# Patient Record
Sex: Female | Born: 1990 | Race: White | Hispanic: No | Marital: Married | Smoking: Never smoker
Health system: Southern US, Community
[De-identification: ages and names within clinical notes are randomized; demographics above are authoritative.]

## PROBLEM LIST (undated history)

## (undated) DIAGNOSIS — N390 Urinary tract infection, site not specified: Secondary | ICD-10-CM

## (undated) DIAGNOSIS — N83209 Unspecified ovarian cyst, unspecified side: Secondary | ICD-10-CM

## (undated) DIAGNOSIS — R51 Headache: Secondary | ICD-10-CM

## (undated) DIAGNOSIS — G43909 Migraine, unspecified, not intractable, without status migrainosus: Secondary | ICD-10-CM

## (undated) DIAGNOSIS — R519 Headache, unspecified: Secondary | ICD-10-CM

## (undated) DIAGNOSIS — D649 Anemia, unspecified: Secondary | ICD-10-CM

## (undated) DIAGNOSIS — B019 Varicella without complication: Secondary | ICD-10-CM

## (undated) DIAGNOSIS — N946 Dysmenorrhea, unspecified: Secondary | ICD-10-CM

## (undated) DIAGNOSIS — Z842 Family history of other diseases of the genitourinary system: Secondary | ICD-10-CM

## (undated) DIAGNOSIS — F410 Panic disorder [episodic paroxysmal anxiety] without agoraphobia: Secondary | ICD-10-CM

## (undated) DIAGNOSIS — E0500 Thyrotoxicosis with diffuse goiter without thyrotoxic crisis or storm: Secondary | ICD-10-CM

## (undated) DIAGNOSIS — T7840XA Allergy, unspecified, initial encounter: Secondary | ICD-10-CM

## (undated) DIAGNOSIS — T4145XA Adverse effect of unspecified anesthetic, initial encounter: Secondary | ICD-10-CM

## (undated) DIAGNOSIS — T8859XA Other complications of anesthesia, initial encounter: Secondary | ICD-10-CM

## (undated) HISTORY — DX: Anemia, unspecified: D64.9

## (undated) HISTORY — DX: Urinary tract infection, site not specified: N39.0

## (undated) HISTORY — DX: Dysmenorrhea, unspecified: N94.6

## (undated) HISTORY — DX: Migraine, unspecified, not intractable, without status migrainosus: G43.909

## (undated) HISTORY — DX: Allergy, unspecified, initial encounter: T78.40XA

## (undated) HISTORY — PX: WISDOM TOOTH EXTRACTION: SHX21

## (undated) HISTORY — DX: Varicella without complication: B01.9

## (undated) HISTORY — DX: Thyrotoxicosis with diffuse goiter without thyrotoxic crisis or storm: E05.00

## (undated) HISTORY — DX: Panic disorder (episodic paroxysmal anxiety): F41.0

## (undated) HISTORY — DX: Unspecified ovarian cyst, unspecified side: N83.209

## (undated) HISTORY — DX: Family history of other diseases of the genitourinary system: Z84.2

---
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
## 2012-07-18 ENCOUNTER — Ambulatory Visit: Payer: Self-pay | Admitting: General Practice

## 2013-06-04 ENCOUNTER — Ambulatory Visit: Payer: Self-pay | Admitting: General Practice

## 2014-01-08 LAB — CBC AND DIFFERENTIAL: HEMOGLOBIN: 13.3 g/dL (ref 12.0–16.0)

## 2014-01-08 LAB — BASIC METABOLIC PANEL
BUN: 8 mg/dL (ref 4–21)
Creatinine: 0.7 mg/dL (ref 0.5–1.1)
GLUCOSE: 88 mg/dL
POTASSIUM: 4.2 mmol/L (ref 3.4–5.3)
Sodium: 141 mmol/L (ref 137–147)

## 2014-01-08 LAB — HEPATIC FUNCTION PANEL
ALT: 16 U/L (ref 7–35)
AST: 22 U/L (ref 13–35)

## 2014-01-08 LAB — TSH: TSH: 2.15 u[IU]/mL (ref 0.41–5.90)

## 2014-01-08 LAB — HEMOGLOBIN A1C: Hgb A1c MFr Bld: 5.4 % (ref 4.0–6.0)

## 2014-01-21 ENCOUNTER — Ambulatory Visit: Payer: Self-pay | Admitting: Endocrinology

## 2014-01-22 ENCOUNTER — Encounter: Payer: Self-pay | Admitting: Endocrinology

## 2014-01-22 ENCOUNTER — Ambulatory Visit (INDEPENDENT_AMBULATORY_CARE_PROVIDER_SITE_OTHER): Payer: No Typology Code available for payment source | Admitting: Endocrinology

## 2014-01-22 VITALS — BP 100/62 | HR 81 | Ht 65.5 in | Wt 155.2 lb

## 2014-01-22 DIAGNOSIS — E0500 Thyrotoxicosis with diffuse goiter without thyrotoxic crisis or storm: Secondary | ICD-10-CM

## 2014-01-22 DIAGNOSIS — E559 Vitamin D deficiency, unspecified: Secondary | ICD-10-CM | POA: Insufficient documentation

## 2014-01-22 MED ORDER — ERGOCALCIFEROL 1.25 MG (50000 UT) PO CAPS: 50000.0000 [IU] | ORAL_CAPSULE | ORAL | Status: DC

## 2014-01-22 NOTE — Assessment & Plan Note (Signed)
The patient appears to be clinically euthyroid today. She does have symptoms of fatigue, tremors, and hair loss, which I do not think, are related to thyroid dysfunction. At this time as her recent TSH has been in the normal range.  Discussed with her that she has been on long-term methimazole for treatment of her Graves' disease and is taking a minimal dose of methimazole. Her TSH is in the mid normal range, and we could consider discontinuing the methimazole at this time to assess whether her Graves' disease Will go into remission. The patient is agreeable to the plan and will stop methimazole for tomorrow.  She is aware to monitor for symptoms of hyperthyroidism, and notify me what they to occur. She had questions related to sedation and dental procedures, and recent response in her heart rate seems to be unrelated to thyroid dysfunction as her labs for thyroid panel were normal recently.  [facility] will [facility] and a lab slip was given to her at this visit for thyroid profile II, free T3, and free T4 levels.  RTC 6 weeks.

## 2014-01-22 NOTE — Patient Instructions (Signed)
Stop methimazole.  Watch for symptoms of unintentional weight loss, tremors, palpitations,feeling hot all the time, diarrhea.  Report back promptly if they occur.   Repeat labs in 6 weeks to assess thyroid levels again.   Please come back for a follow-up appointment in 6 weeks

## 2014-01-22 NOTE — Progress Notes (Signed)
Pre visit review using our clinic review tool, if applicable. No additional management support is needed unless otherwise documented below in the visit note. 

## 2014-01-22 NOTE — Progress Notes (Signed)
Reason for visit: Graves' disease HPI  Ruth Tran is a 23 y.o.-year-old female, referred by [facility] for further evaluation and management of Graves' disease.  She sees Ashok Cordia at [facility]. 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  Patient reports being diagnosed with Graves' disease in 2005, in a few weeks posthospitalization for an abscess related to ruptured appendix. She was then followed by endocrinology, at [facility] and started on methimazole treatment. The patient has continued on methimazole in the past 10 years, and recalls being tried to wean off the medication over 5 years ago. She reports that her labs showed hyperthyroidism, on stopping methimazole, and she was asked to start back on it. In the past 4-5 years, the patient has continued on methimazole 2.5 mg daily and has been tolerating it well. She has missed about one to 2 weeks doses over summer, however, has been compliant with this dose daily in the last 2 months.  [facility] on 01/08/2014 show TSH at 2.15, total T4 at 6.8, free thyroxine index 1.6, T3 uptake 24, free T4-1.05.  Patient does have a strong family history of Graves' disease in her maternal grandfather and maternal great-grandmother. She denies any prior personal XRT exposure to her neck area. She is generally healthy.  Patient denies any recent hospitalization, illness or major change in his medical history. The patient denies any plans for pregnancy in the near future and is not in a relationship.  She did have a recent sedation for wisdom teeth removal, following which she reports that her HR increased from 80 to 140, and the dentist didn't go through with the procedure.  Denies any recent ingestion of seaweed/kelp or recent exposure to iv contrast dye. No recent steroid use or use of herbal supplements.    I reviewed pt's thyroid tests: Lab Results  Component Value Date   TSH  2.15 01/08/2014    Other pertinent labs are as follows: Lab Results  Component Value Date   AST 22 01/08/2014   Lab Results  Component Value Date   ALT 16 01/08/2014    Review of systems:  [ x ] complains of   [  ] denies  [ x ] Hair loss [  ] weight loss [ x ]  Tremors in the past month  [  ] palpitations  [  ] diarrhea [  ] increased anxiety [  ] muscle weakness  [ x ] heat intolerance, with night sweats [ x  ] fatigue since many years [  ] proptosis [  ] problems with eye closure or color vision. [  ] noticing any enlargement in size of thyroid [  ] lumps in neck [  ] dysphagia  [  ] change in voice [  ]  SOB when lying down   I reviewed her chart and she also has a history of Vitamin D deficiency, for which she is on high-dose vitamin D supplementation. I have reviewed the patient's past medical history, family and social history, surgical history, medications and allergies.  No past medical history on file. Past Surgical History  Procedure Laterality Date  . Appendectomy     History   Social History  . Marital Status: Single    Spouse Name: N/A    Number of Children: N/A  . Years of Education: N/A   Occupational History  . Not on file.   Social History Main Topics  .  Smoking status: Never Smoker   . Smokeless tobacco: Not on file  . Alcohol Use: Not on file  . Drug Use: Not on file  . Sexual Activity: Not on file   Other Topics Concern  . Not on file   Social History Narrative  . No narrative on file   No current outpatient prescriptions on file prior to visit.   No current facility-administered medications on file prior to visit.   Allergies  Allergen Reactions  . Latex Rash  . Tape Rash   Family History  Problem Relation Age of Onset  . Diabetes Father   . Graves' disease Maternal Grandfather   . Graves' disease Other     ROS: Review of Systems: [x]  complains of  [  ] denies General:   [  ] Recent weight change [ x ] Fatigue  [  ] Loss of  appetite Eyes: [  ]  Vision Difficulty [  ]  Eye pain ENT: [  ]  Hearing difficulty [  ]  Difficulty Swallowing CVS: [  ] Chest pain [  ]  Palpitations/Irregular Heart beat [  ]  Shortness of breath lying flat [  ] Swelling of legs Resp: [  ] Frequent Cough [  ] Shortness of Breath  [  ]  Wheezing GI: [  ] Heartburn  [  ] Nausea or Vomiting  [  ] Diarrhea [  ] Constipation  [  ] Abdominal Pain GU: [  ]  Polyuria  [  ]  nocturia Bones/joints:  [  ]  Muscle aches  [  ] Joint Pain  [  ] Bone pain Skin/Hair/Nails: [  ]  Rash  [  ] New stretch marks [  ]  Itching [  ] Hair loss [  ]  Excessive hair growth Reproduction: [  ] Low sexual desire , [  ]  Women: Menstrual cycle problems [  ]  Women: Breast Discharge [  ] Men: Difficulty with erections [  ]  Men: Enlarged Breasts CNS: [  ] Frequent Headaches [  ] Blurry vision [  ] Tremors [  ] Seizures [  ] Loss of consciousness [  ] Localized weakness Endocrine: [  ]  Excess thirst [  ]  Feeling excessively hot [  ]  Feeling excessively cold Heme: [  ]  Easy bruising [  ]  Enlarged glands or lumps in neck Allergy: [  ]  Food allergies [  ] Environmental allergies  PE: BP 100/62  Pulse 81  Ht 5' 5.5" (1.664 m)  Wt 155 lb 4 oz (70.421 kg)  BMI 25.43 kg/m2  SpO2 97% Wt Readings from Last 3 Encounters:  01/22/14 155 lb 4 oz (70.421 kg)    HEENT: [place]/AT, EOMI, no icterus, no proptosis, no chemosis, no mild lid lag, no retraction, eyes close completely Neck: thyroid gland - smooth, non-tender, no erythema, no tracheal deviation; negative Pemberton's sign; no lympahadenopathy; no bruits Lungs: good air entry, clear bilaterally Heart: S1&S2 normal, regular rate & rhythm; no murmurs, rubs or gallops Abd: soft, NT, ND, no HSM, +BS Ext: min tremor in hands bilaterally, no edema, 2+ DP/PT pulses, good muscle mass Neuro: normal gait, 2+ reflexes bilaterally, normal 5/5 strength, no proximal myopathy  Derm: no pretibial myxoedema/skin  dryness   ASSESSMENT: Problem List Items Addressed This Visit     Endocrine   Graves disease - Primary     The patient appears to be  clinically euthyroid today. She does have symptoms of fatigue, tremors, and hair loss, which I do not think, are related to thyroid dysfunction. At this time as her recent TSH has been in the normal range.  Discussed with her that she has been on long-term methimazole for treatment of her Graves' disease and is taking a minimal dose of methimazole. Her TSH is in the mid normal range, and we could consider discontinuing the methimazole at this time to assess whether her Graves' disease Will go into remission. The patient is agreeable to the plan and will stop methimazole for tomorrow.  She is aware to monitor for symptoms of hyperthyroidism, and notify me what they to occur. She had questions related to sedation and dental procedures, and recent response in her heart rate seems to be unrelated to thyroid dysfunction as her labs for thyroid panel were normal recently.  She will have [facility] in about [facility] and a lab slip was given to her at this visit for thyroid profile II, free T3, and free T4 levels.  RTC 6 weeks.          Yared Barefoot [facility] 01/22/2014  4:10 PM

## 2014-03-05 ENCOUNTER — Ambulatory Visit: Payer: No Typology Code available for payment source | Admitting: Endocrinology

## 2014-03-17 LAB — TSH: TSH: 2.08 u[IU]/mL (ref 0.41–5.90)

## 2014-03-19 ENCOUNTER — Ambulatory Visit (INDEPENDENT_AMBULATORY_CARE_PROVIDER_SITE_OTHER): Payer: No Typology Code available for payment source | Admitting: Endocrinology

## 2014-03-19 ENCOUNTER — Encounter: Payer: Self-pay | Admitting: Endocrinology

## 2014-03-19 VITALS — BP 102/64 | HR 61 | Wt 157.0 lb

## 2014-03-19 DIAGNOSIS — E0500 Thyrotoxicosis with diffuse goiter without thyrotoxic crisis or storm: Secondary | ICD-10-CM

## 2014-03-19 NOTE — Assessment & Plan Note (Addendum)
Appears clinically euthyroid today on exam. Recent symptoms might be related to stress levels in light of her normal thyroid levels.  Explained that with recent start of OCPs, there will be some in increase in total thyroid hormone levels, but free levels are in normal range. Should recheck in another 1 month to fully gauge the effect of OCPs on thyroid levels.  Pt agreeable, and lab slip given for labs to be done in 1 month.  RTC 5 weeks.  Notify if worsening symptoms. She will continue to stay off the methimazole.

## 2014-03-19 NOTE — Progress Notes (Signed)
Reason for visit: Graves' disease HPI  Ruth Tran is a 23 y.o.-year-old female, here for follow up management of Graves' disease.  She sees Ashok Cordia at [facility]. 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  Diagnosed with Graves' disease in 2005, and started on methimazole treatment. The patient has continued on methimazole in the past 10 years, and recalls one unsuccessful attempt at a methimazole wean about 5 years ago. Was taking methimazole 2.5 mg daily till last visit in August 2015, when I asked her to stop taking the medication.  Now off for around 6 weeks.  Started BCPs about 3-4 weeks ago for cramping and menstrual irregularity. Stressed lately due to her exams and school.    [facility] on 01/[facility]/2014 show TSH at 2.15, total T4 at 6.8, free thyroxine index 1.6, T3 uptake 24, free T4-1.05. Labs done in October, 20th, 2015- TSH 2.08, TT4 10.6, TT3 163, FTI 2.2, free T4 1.26, Free T3 at 3.2     I reviewed pt's thyroid tests: Lab Results  Component Value Date   TSH 2.08 03/17/2014   TSH 2.15 01/08/2014    Other pertinent labs are as follows: Lab Results  Component Value Date   AST 22 01/08/2014   Lab Results  Component Value Date   ALT 16 01/08/2014    Review of systems:  [ x ] complains of   [  ] denies  [  ] Hair loss [  ] weight loss [ x ]  Tremors   [x  ] occasional palpitations  [  ] diarrhea [ x ] increased anxiety [  ] muscle weakness  [ x  ] fatigue since many years [  ] proptosis [  ] problems with eye closure or color vision. [  ] noticing any enlargement in size of thyroid [  ] lumps in neck [  ] dysphagia  [  ] change in voice [  ]  SOB when lying down Increased appetite recently. Getting less sleep lately. Having some menstrual irreg after stopping methimazole. GYN thinks that could be related to endometriosis. Weight up by 2 lbs.    I have reviewed the patient's past medical  history,  medications and allergies.   Current Outpatient Prescriptions on File Prior to Visit  Medication Sig Dispense Refill  . ergocalciferol (VITAMIN D2) 50000 UNITS capsule Take 1 capsule (50,000 Units total) by mouth once a week.  4 capsule  0   No current facility-administered medications on file prior to visit.   Allergies  Allergen Reactions  . Latex Rash  . Tape Rash     PE: BP 102/64  Pulse 61  Wt 157 lb (71.215 kg)  SpO2 98% Wt Readings from Last 3 Encounters:  03/19/14 157 lb (71.215 kg)  01/22/14 155 lb 4 oz (70.421 kg)    HEENT: [place]/AT, EOMI, no icterus, no proptosis, no chemosis, no mild lid lag, no retraction, eyes close completely Neck: thyroid gland - smooth, non-tender, no erythema, no tracheal deviation; negative Pemberton's sign; no lympahadenopathy; no bruits Lungs: good air entry, clear bilaterally Heart: S1&S2 normal, regular rate & rhythm; no murmurs, rubs or gallops Abd: soft, NT, ND, no HSM, +BS Ext: min tremor in hands bilaterally, no edema, 2+ DP/PT pulses, good muscle mass Neuro: normal gait, 2+ reflexes bilaterally, normal 5/5 strength, no proximal myopathy  Derm: no pretibial myxoedema/skin dryness   ASSESSMENT: Problem List Items Addressed This Visit  Endocrine   Graves disease - Primary     Appears clinically euthyroid today on exam. Recent symptoms might be related to stress levels in light of her normal thyroid levels.  Explained that with recent start of OCPs, there will be some in increase in total thyroid hormone levels, but free levels are in normal range. Should recheck in another 1 month to fully gauge the effect of OCPs on thyroid levels.  Pt agreeable, and lab slip given for labs to be done in 1 month.  RTC 5 weeks.  Notify if worsening symptoms. She will continue to stay off the methimazole.           Paizleigh Wilds [facility] 03/19/2014  4:17 PM

## 2014-03-19 NOTE — Progress Notes (Signed)
Pre visit review using our clinic review tool, if applicable. No additional management support is needed unless otherwise documented below in the visit note. 

## 2014-03-19 NOTE — Patient Instructions (Signed)
Monitor symptoms for hyperthyroidism.  Labs in 5 weeks at local [place] ( lab slip given).  RTC 5 weeks.

## 2014-04-30 ENCOUNTER — Ambulatory Visit: Payer: No Typology Code available for payment source | Admitting: Endocrinology

## 2014-05-11 LAB — TSH
TSH: 1.37 u[IU]/mL (ref <=5.90)
TSH: 1.41 u[IU]/mL (ref 0.41–5.90)

## 2014-05-14 ENCOUNTER — Ambulatory Visit (INDEPENDENT_AMBULATORY_CARE_PROVIDER_SITE_OTHER): Payer: No Typology Code available for payment source | Admitting: Endocrinology

## 2014-05-14 VITALS — BP 102/60 | HR 69 | Ht 65.5 in | Wt 157.5 lb

## 2014-05-14 DIAGNOSIS — E0500 Thyrotoxicosis with diffuse goiter without thyrotoxic crisis or storm: Secondary | ICD-10-CM

## 2014-05-14 NOTE — Assessment & Plan Note (Signed)
Will add on thyroid panel ( T3, T4, free T3 and free T4 levels ) to recent labs done this week.  Appears clinically euthyroid today on exam. Recent symptoms might be related to stress levels in light of her normal thyroid levels.   Notify if worsening symptoms. She will continue to stay off the methimazole.   RTC 6 months or sooner if worsening symptoms.

## 2014-05-14 NOTE — Progress Notes (Signed)
Pre visit review using our clinic review tool, if applicable. No additional management support is needed unless otherwise documented below in the visit note. 

## 2014-05-14 NOTE — Progress Notes (Signed)
Patient ID: Ruth Tran, female   DOB: [birth year], 23 y.o.   MRN: [id number]   Reason for visit: Graves' disease HPI  Ruth Tran is a 23 y.o.-year-old female, here for follow up management of Graves' disease.  She sees Ashok Cordia at [facility]. 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  Diagnosed with Graves' disease in 2005, and started on methimazole treatment. The patient has continued on methimazole in the past 10 years, and recalls one unsuccessful attempt at a methimazole wean about 5 years ago. Was taking methimazole 2.5 mg daily till  August 2015, when it was weaned off.   Started BCPs about Oct 2015 for cramping and menstrual irregularity. Stressed lately due to her exams and school. Does use her phone on dim setting prior to bedtime. Has been very stressed recently due to exams. Is having insomnia.    [facility] on 01/08/2014 show TSH at 2.15, total T4 at 6.8, free thyroxine index 1.6, T3 uptake 24, free T4-1.05. Labs done in October, 20th, 2015- TSH 2.08, TT4 10.6, TT3 163, FTI 2.2, free T4 1.26, Free T3 at 3.29 Apr 2014-TSH as below     I reviewed pt's thyroid tests: Lab Results  Component Value Date   TSH 1.41 05/11/2014   TSH 2.08 03/17/2014   TSH 2.15 01/08/2014    Other pertinent labs are as follows: Lab Results  Component Value Date   AST 22 01/08/2014   Lab Results  Component Value Date   ALT 16 01/08/2014    Review of systems:  [ x ] complains of   [  ] denies  [  ] Hair loss [  ] weight loss [ x ]  Tremors-chronic   [ ]   palpitations  [  ] diarrhea [ x ] increased anxiety [  ] muscle weakness  [ x  ] fatigue since many years, mid morning with insomnia and snoring [x ] hot flashes [  ] proptosis [  ] problems with eye closure or color vision. [  ] noticing any enlargement in size of thyroid [  ] lumps in neck [  ] dysphagia  [  ] change in voice [  ]  SOB when lying  down    I have reviewed the patient's past medical history,  medications and allergies.   Current Outpatient Prescriptions on File Prior to Visit  Medication Sig Dispense Refill  . ergocalciferol (VITAMIN D2) 50000 UNITS capsule Take 1 capsule (50,000 Units total) by mouth once a week. 4 capsule 0  . norethindrone-ethinyl estradiol (MICROGESTIN,JUNEL,LOESTRIN) 1-20 MG-MCG tablet Take 1 tablet by mouth daily.     No current facility-administered medications on file prior to visit.   Allergies  Allergen Reactions  . Latex Rash  . Tape Rash     PE: BP 102/60 mmHg  Pulse 69  Ht 5' 5.5" (1.664 m)  Wt 157 lb 8 oz (71.442 kg)  BMI 25.80 kg/m2  SpO2 97% Wt Readings from Last 3 Encounters:  05/14/14 157 lb 8 oz (71.442 kg)  03/19/14 157 lb (71.215 kg)  01/22/14 155 lb 4 oz (70.421 kg)    HEENT: [place]/AT, EOMI, no icterus, no proptosis, no chemosis, no mild lid lag, no retraction, eyes close completely Neck: thyroid gland - smooth, non-tender, no erythema, no tracheal deviation; negative Pemberton's sign; no lympahadenopathy; no bruits Lungs: good air entry, clear bilaterally Heart: S1&S2 normal, regular rate & rhythm; no  murmurs, rubs or gallops Abd: soft, NT, ND, no HSM, +BS Ext: min tremor in hands bilaterally, no edema, 2+ DP/PT pulses, good muscle mass Neuro: normal gait, 2+ reflexes bilaterally, normal 5/5 strength, no proximal myopathy  Derm: no pretibial myxoedema/skin dryness   ASSESSMENT: Problem List Items Addressed This Visit      Endocrine   Graves disease - Primary    Will add on thyroid panel ( T3, T4, free T3 and free T4 levels ) to recent labs done this week.  Appears clinically euthyroid today on exam. Recent symptoms might be related to stress levels in light of her normal thyroid levels.   Notify if worsening symptoms. She will continue to stay off the methimazole.   RTC 6 months or sooner if worsening symptoms.             Arnetra Terris  PUSHKAR 05/14/2014  1:00 PM

## 2014-05-14 NOTE — Patient Instructions (Signed)
Monitor symptoms.  Please come back for a follow-up appointment in 6 months

## 2014-05-21 ENCOUNTER — Ambulatory Visit: Payer: Self-pay | Admitting: Physician Assistant

## 2014-05-25 ENCOUNTER — Telehealth: Payer: Self-pay

## 2014-05-25 NOTE — Telephone Encounter (Signed)
Lab results that couldn't be abstracted:  Thyroid Profile II  TSH- 1.370       Thyroxine (T4)- 11.1  T3 uptake- 20  Free thyroxine index- 2.2  Triiodothyronine (T3)- 135  Thyroxine (T4) free direct- 1.20  Triiodothyronine, free, serum- 3.1

## 2014-05-27 NOTE — Telephone Encounter (Signed)
All these look normal. Please inform patient

## 2014-05-28 NOTE — Telephone Encounter (Signed)
Spoke with patient to notify her of results. Patient verbalized understanding.

## 2014-07-30 LAB — HM PAP SMEAR: HM PAP: NEGATIVE

## 2014-11-10 ENCOUNTER — Telehealth: Payer: Self-pay

## 2014-11-10 NOTE — Telephone Encounter (Signed)
The pt's mother and father called upset that they were unaware Dr.Phadke was leaving and asked why the appointment was not re-scheduled.  After hearing various screaming from both the mother and father, I ended the call.  Do you want the patient re-scheduled for Dr.Gherghe?

## 2014-11-10 NOTE — Telephone Encounter (Signed)
Called and spoke with patient directly (without parents on the line - pt is 56) and the patient is scheduled for Thursday afternoon (6/16)  She was very agreeable to this.

## 2014-11-12 ENCOUNTER — Encounter: Payer: Self-pay | Admitting: Endocrinology

## 2014-11-12 ENCOUNTER — Ambulatory Visit (INDEPENDENT_AMBULATORY_CARE_PROVIDER_SITE_OTHER): Payer: No Typology Code available for payment source | Admitting: Endocrinology

## 2014-11-12 VITALS — BP 110/62 | HR 63 | Resp 12 | Ht 65.5 in | Wt 160.8 lb

## 2014-11-12 DIAGNOSIS — E0500 Thyrotoxicosis with diffuse goiter without thyrotoxic crisis or storm: Secondary | ICD-10-CM | POA: Diagnosis not present

## 2014-11-12 LAB — TSH: TSH: 3.39 u[IU]/mL (ref 0.41–5.90)

## 2014-11-12 NOTE — Progress Notes (Signed)
Pre visit review using our clinic review tool, if applicable. No additional management support is needed unless otherwise documented below in the visit note. 

## 2014-11-12 NOTE — Progress Notes (Signed)
Reason for visit: Graves' disease HPI  Ruth Tran is a 24 y.o.-year-old female, here for follow up management of Graves' disease.  She sees Ashok Cordia at [facility]. 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  Diagnosed with Graves' disease in 2005, and started on methimazole treatment. The patient has continued on methimazole in the past 10 years, and recalls one unsuccessful attempt at a methimazole wean about 5 years ago. Was taking methimazole 2.5 mg daily till  August 2015, when it was weaned off.   Started BCPs about Oct 2015 for cramping and menstrual irregularity.  Since past visit, she reports that she has had multiple symptoms that include pelvic pain with nausea and lighter periods that last 2 days at a time. Dark brown blood, happening for past 2 months Lack of concentration and focus that happens in spells, she become pale and doesn't respond when someone is talking to her, no known seizures or any accidents. No tongue bites. Is going to see therapist, and possibly neuro after that. Gained 3 lbs, is more constipated these days, eye lashes are falling out and has insomnia due to frequent awakening.    [facility] on [facility]/2014 show TSH at 2.15, total T4 at 6.8, free thyroxine index 1.6, T3 uptake 24, free T4-1.05. Labs done in October, 20th, 2015- TSH 2.08, TT4 10.6, TT3 163, FTI 2.2, free T4 1.26, Free T3 at 3.29 Apr 2014-TSH as below     I reviewed pt's thyroid tests: Lab Results  Component Value Date   TSH 8.60* 11/12/2014   TSH 1.41 05/11/2014   TSH 1.37 05/11/2014   TSH 2.08 03/17/2014   TSH 2.15 01/08/2014    Other pertinent labs are as follows: Lab Results  Component Value Date   AST 22 01/08/2014   Lab Results  Component Value Date   ALT 16 01/08/2014    Review of systems:  [ x ] complains of   [  ] denies  [  ] Hair loss [  ] weight loss [ x ]  Tremors-chronic   [ x]  palpitations  -occasional [  ] diarrhea [ x ] increased anxiety [  ] muscle weakness  [ x  ] fatigue since many years, mid morning with insomnia and snoring [x ] hot flashes [  ] proptosis [  ] problems with eye closure or color vision. [  ] noticing any enlargement in size of thyroid [  ] lumps in neck [  ] dysphagia  [  ] change in voice [  ]  SOB when lying down    I have reviewed the patient's past medical history,  medications and allergies.   Current Outpatient Prescriptions on File Prior to Visit  Medication Sig Dispense Refill  . norethindrone-ethinyl estradiol (MICROGESTIN,JUNEL,LOESTRIN) 1-20 MG-MCG tablet Take 1 tablet by mouth daily.     No current facility-administered medications on file prior to visit.   Allergies  Allergen Reactions  . Latex Rash  . Tape Rash     PE: BP 110/62 mmHg  Pulse 63  Resp 12  Ht 5' 5.5" (1.664 m)  Wt 160 lb 12 oz (72.916 kg)  BMI 26.33 kg/m2  SpO2 98% Wt Readings from Last 3 Encounters:  11/12/14 160 lb 12 oz (72.916 kg)  05/14/14 157 lb 8 oz (71.442 kg)  03/19/14 157 lb (71.215 kg)    HEENT: [place]/AT, EOMI, no icterus, no proptosis, no chemosis, no  mild lid lag, no retraction, eyes close completely Neck: thyroid gland - smooth, non-tender, no erythema, no tracheal deviation; negative Pemberton's sign; no lympahadenopathy; no bruits Lungs: good air entry, clear bilaterally Heart: S1&S2 normal, regular rate & rhythm; no murmurs, rubs or gallops Ext: min tremor in hands bilaterally, no edema, 2+ DP/PT pulses, good muscle mass Neuro: normal gait, 2+ reflexes bilaterally, normal 5/5 strength, no proximal myopathy  Derm: no pretibial myxoedema/skin dryness   ASSESSMENT: Problem List Items Addressed This Visit      Endocrine   Graves disease - Primary     Appears clinically euthyroid today on exam. Recent symptoms might be related to stress levels. I am concerned that there is something neurological causes these spells,?absense seizures. She  was asked to follow back with her PCP for these symptoms.  I have given her a lab slip for the labs for thyroid to be locally at [facility].   She will continue to stay off the methimazole.                 RTC 1 year. Explained that I am [place] out of state, and she is going to follow back closer to her residence in [place].   Tiombe Tomeo [facility] 11/16/2014  9:43 AM

## 2014-11-12 NOTE — Patient Instructions (Signed)
Labs for thyroid to be done locally at [place].  follow back with PCP for abnormal spells, ?seziures  Please return in 1 year. Endocrine follow up possibly at [facility]

## 2014-11-16 NOTE — Assessment & Plan Note (Signed)
  Appears clinically euthyroid today on exam. Recent symptoms might be related to stress levels. I am concerned that there is something neurological causes these spells,?absense seizures. She was asked to follow back with her PCP for these symptoms.  I have given her a lab slip for the labs for thyroid to be locally at [facility].   She will continue to stay off the methimazole.

## 2014-11-17 ENCOUNTER — Telehealth: Payer: Self-pay | Admitting: *Deleted

## 2014-11-17 NOTE — Telephone Encounter (Signed)
-----   Message from Haydee Monica, MD sent at 11/17/2014  2:04 PM EDT ----- Regarding: pt results Please let her know that I have reviewed her results from [facility], done 11/12/14-  TSH= 3.390 TT4 =8.6 T3U= 20 FTI =1.7 TT3= 148 Free T4 =1.01  Please let her know that thyroid levels are in normal range for now, but looks like they are responding to her other recent symptoms ( possible mild sick euthroid picture- where there is a secondary etiology that causes thyroid levels to fluctuate some). Recheck thyroid labs in 3 months to ensure stability.   Please see where the last TSH was abstracted from in our EMR- it is probably a false reading.

## 2014-11-17 NOTE — Telephone Encounter (Signed)
Left message, notifying patient. 

## 2014-11-18 ENCOUNTER — Encounter: Payer: Self-pay | Admitting: *Deleted

## 2014-11-18 NOTE — Progress Notes (Signed)
Order(s) created erroneously. Erroneous order ID: [id number]  Order moved by: Alfonso Patten  Order move date/time: 11/18/2014 10:30 AM  Source Patient: [id number]  Source Contact: 11/13/2014  Destination Patient: [id number]  Destination Contact: 08/13/2012

## 2014-11-19 ENCOUNTER — Ambulatory Visit: Payer: No Typology Code available for payment source | Admitting: Endocrinology

## 2015-01-14 ENCOUNTER — Encounter: Payer: Self-pay | Admitting: Obstetrics and Gynecology

## 2015-01-14 ENCOUNTER — Ambulatory Visit (INDEPENDENT_AMBULATORY_CARE_PROVIDER_SITE_OTHER): Payer: PRIVATE HEALTH INSURANCE | Admitting: Obstetrics and Gynecology

## 2015-01-14 VITALS — BP 105/71 | HR 75 | Ht 65.0 in | Wt 162.4 lb

## 2015-01-14 DIAGNOSIS — N946 Dysmenorrhea, unspecified: Secondary | ICD-10-CM

## 2015-01-14 DIAGNOSIS — N920 Excessive and frequent menstruation with regular cycle: Secondary | ICD-10-CM

## 2015-01-14 NOTE — Progress Notes (Signed)
Patient ID: Ruth Tran, female   DOB: 01/03/1991, 24 y.o.   MRN: [id number]   CONFERENCE NOTE:   Pt her for heavy periods, taking microgenstin 1/20 as prescribed. LMP: 12/29/2014, has now started spotting for the first time since on medication.  Chief complaint: 1.  History of menorrhagia and dysmenorrhea.  Patient presents for 6 month follow-up on OCPs for cycle regulation and pain control. Patient reports no significant pain. Cycles have remained monthly and duration of bleeding is 4 days (down from 7 days).  However, she has noted spotting this month in her second week of pills.  She has not skipped a pill or varied in the time of taking her pills.  IMPRESSION: 1.  Dysmenorrhea, improved on OCPs. 2.  Menorrhagia, controlled on OCPs. 3.  New onset spotting.  PLAN: 1.  Continue with OCPs 1/20 as written. 2.  Menstrual calendar, monitoring 3.  If spotting persists over the next 2 cycles, we will likely increase the estrogen content of her pill. 4.  Return in 6 months for annual physical or when necessary.  A total of 15 minutes were spent face-to-face with the patient during this encounter and over half of that time dealt with counseling and coordination of care.  Brayton Mars, MD

## 2015-01-14 NOTE — Patient Instructions (Signed)
1.  Continue with menstrual calendar, monitoring. 2.  Continue her OCP 1/20 as directed. 3.  Contact us if spotting persists into the next cycle of pills. 4.  Return in 6 months for annual exam and follow-up

## 2015-01-23 ENCOUNTER — Other Ambulatory Visit: Payer: Self-pay | Admitting: Obstetrics and Gynecology

## 2015-01-25 ENCOUNTER — Telehealth: Payer: Self-pay | Admitting: Obstetrics and Gynecology

## 2015-01-25 NOTE — Telephone Encounter (Signed)
Pt aware 1 pack with no refills was sent to [place]. Pt will need appt for further refills. (6 month f/u on dysmenorrhea)

## 2015-01-25 NOTE — Telephone Encounter (Signed)
Pt called and requested her BC to be filled.

## 2015-01-27 ENCOUNTER — Telehealth: Payer: Self-pay | Admitting: Obstetrics and Gynecology

## 2015-01-27 NOTE — Telephone Encounter (Signed)
Pt said that she saw Dr Tennis Must on 8/18. She needs BC refilled at [facility]. You told her to call you back.

## 2015-01-28 ENCOUNTER — Other Ambulatory Visit: Payer: Self-pay

## 2015-01-28 MED ORDER — NORETHINDRONE ACET-ETHINYL EST 1-20 MG-MCG PO TABS: 1.0000 | ORAL_TABLET | Freq: Every day | ORAL | Status: DC

## 2015-01-28 NOTE — Telephone Encounter (Signed)
Left message that bcp rx was sent in.

## 2015-02-02 ENCOUNTER — Ambulatory Visit: Payer: Self-pay | Admitting: Obstetrics and Gynecology

## 2015-02-19 ENCOUNTER — Other Ambulatory Visit: Payer: Self-pay | Admitting: Obstetrics and Gynecology

## 2015-02-22 ENCOUNTER — Telehealth: Payer: Self-pay | Admitting: Obstetrics and Gynecology

## 2015-02-22 MED ORDER — NORETHINDRONE ACET-ETHINYL EST 1-20 MG-MCG PO TABS: 1.0000 | ORAL_TABLET | Freq: Every day | ORAL | Status: DC

## 2015-02-22 NOTE — Telephone Encounter (Signed)
PT CALLED LAST MONTH AND DEBBIE WAS SUPPOSE TO SEND A 6 MONTH REFILL ON HER BC BUT DID NOT, SHE NEEDS A REFILL FOR HER BC, SHE USES [facility] [place], AND IT MICROGESTRIN FE

## 2015-02-22 NOTE — Telephone Encounter (Signed)
Pt states she can not get her ocp. She is away at college and doesn't come home very often. Informed pt Deb did send in her rx but per the pharmacy she picked up her last one on 9/3 so her next refill will be due for p/u around 10/3. Offered to send in a 90 days supply if that would be easier for her. Aware 90 day supply sent in.

## 2015-04-09 ENCOUNTER — Encounter: Payer: Self-pay | Admitting: Physician Assistant

## 2015-04-09 ENCOUNTER — Ambulatory Visit: Payer: Self-pay | Admitting: Physician Assistant

## 2015-04-09 VITALS — BP 110/60 | HR 104 | Temp 98.4°F

## 2015-04-09 DIAGNOSIS — J069 Acute upper respiratory infection, unspecified: Secondary | ICD-10-CM

## 2015-04-09 DIAGNOSIS — H1030 Unspecified acute conjunctivitis, unspecified eye: Secondary | ICD-10-CM

## 2015-04-09 MED ORDER — FLUTICASONE PROPIONATE 50 MCG/ACT NA SUSP: 2.0000 | Freq: Every day | NASAL | Status: DC

## 2015-04-09 MED ORDER — TOBRAMYCIN 0.3 % OP SOLN: 2.0000 [drp] | Freq: Four times a day (QID) | OPHTHALMIC | Status: DC

## 2015-04-09 MED ORDER — AMOXICILLIN 875 MG PO TABS: 875.0000 mg | ORAL_TABLET | Freq: Two times a day (BID) | ORAL | Status: DC

## 2015-04-09 NOTE — Progress Notes (Signed)
S: C/o runny nose and congestion for 3 days, no fever, chills, cp/sob, v/d; mucus was green this am but clear throughout the day, cough is sporadic, r eye is red and watery    O: PE: perrl eomi, conjunctiva injected r eye;  normocephalic, tms dull, nasal mucosa red and swollen, throat red, neck supple no lymph, lungs c t a, cv rrr, neuro intact  A:  Acute uri   P: amoxil 875mg  bid, tobramycin opth gtts, flonase; drink fluids, continue regular meds , use otc meds of choice, return if not improving in 5 days, return earlier if worsening

## 2015-04-09 NOTE — Addendum Note (Signed)
Addended by: Versie Starks on: 04/09/2015 02:16 PM   Modules accepted: Orders

## 2015-04-09 NOTE — Progress Notes (Signed)
[facility] rx, rx rerouted to [facility]

## 2015-04-21 ENCOUNTER — Ambulatory Visit: Payer: Self-pay | Admitting: Physician Assistant

## 2015-04-21 DIAGNOSIS — Z299 Encounter for prophylactic measures, unspecified: Secondary | ICD-10-CM

## 2015-04-21 MED ORDER — TUBERCULIN PPD 5 UNIT/0.1ML ID SOLN
5.0000 [IU] | Freq: Once | INTRADERMAL | Status: AC
  Administered 2015-04-21: 5 [IU] via INTRADERMAL

## 2015-04-21 NOTE — Progress Notes (Signed)
Patient ID: Ruth Tran, female   DOB: 1990-11-03, 24 y.o.   MRN: [id number] Patient expressed that she is going to be volunteering at [employer] and needs to have a Flu Shot and a TB Test.

## 2015-04-30 ENCOUNTER — Encounter: Payer: Self-pay | Admitting: Physician Assistant

## 2015-04-30 ENCOUNTER — Ambulatory Visit: Payer: Self-pay | Admitting: Physician Assistant

## 2015-04-30 VITALS — BP 109/65 | HR 78 | Temp 97.7°F | Ht 65.0 in | Wt 165.0 lb

## 2015-04-30 DIAGNOSIS — Z299 Encounter for prophylactic measures, unspecified: Secondary | ICD-10-CM

## 2015-04-30 DIAGNOSIS — E559 Vitamin D deficiency, unspecified: Secondary | ICD-10-CM

## 2015-04-30 DIAGNOSIS — R300 Dysuria: Secondary | ICD-10-CM

## 2015-04-30 DIAGNOSIS — E0500 Thyrotoxicosis with diffuse goiter without thyrotoxic crisis or storm: Secondary | ICD-10-CM

## 2015-04-30 LAB — POCT URINALYSIS DIPSTICK
Bilirubin, UA: NEGATIVE
Glucose, UA: NEGATIVE
Ketones, UA: NEGATIVE
Nitrite, UA: NEGATIVE
PROTEIN UA: NEGATIVE
RBC UA: NEGATIVE
SPEC GRAV UA: 1.025
UROBILINOGEN UA: 0.2
pH, UA: 6

## 2015-04-30 MED ORDER — CIPROFLOXACIN HCL 500 MG PO TABS: 500.0000 mg | ORAL_TABLET | Freq: Two times a day (BID) | ORAL | Status: DC

## 2015-04-30 NOTE — Progress Notes (Signed)
S: here for basic physical, scheduled for pap with gyn in march, no problems , just urine recently has strong odor and has some burning with urination, no fever/chills, remainder ros neg O: vitals wnl, nad,   Ent:  tms clear, nasal mucosa pink, throat wnl, neck supple no lymph Lungs: Normal effort. Lungs are clear to auscultation, no crackles or wheezes Cardiovascular: Regular rate and rhythm, no edema Abd: soft nontender bs normal all 4 quads Musculoskeletal:  Neurovascularly intact Neurological: No new neurological deficits UA: trace leuks  A: yearly physical, uti  P: labs drawn today, cipro 500mg  bid x 3d, f/u with gyn and thyroid doctor

## 2015-05-03 LAB — CMP12+LP+TP+TSH+6AC+CBC/D/PLT
ALBUMIN: 4 g/dL (ref 3.5–5.5)
ALT: 13 IU/L (ref 0–32)
AST: 24 IU/L (ref 0–40)
Albumin/Globulin Ratio: 1.6 (ref 1.1–2.5)
Alkaline Phosphatase: 60 IU/L (ref 39–117)
BASOS ABS: 0 10*3/uL (ref 0.0–0.2)
BILIRUBIN TOTAL: 0.7 mg/dL (ref 0.0–1.2)
BUN / CREAT RATIO: 13 (ref 8–20)
BUN: 8 mg/dL (ref 6–20)
Basos: 0 %
CHLORIDE: 101 mmol/L (ref 97–106)
CHOLESTEROL TOTAL: 170 mg/dL (ref 100–199)
Calcium: 9.1 mg/dL (ref 8.7–10.2)
Chol/HDL Ratio: 3 ratio units (ref 0.0–4.4)
Creatinine, Ser: 0.62 mg/dL (ref 0.57–1.00)
EOS (ABSOLUTE): 0 10*3/uL (ref 0.0–0.4)
EOS: 0 %
FREE THYROXINE INDEX: 2.4 (ref 1.2–4.9)
GFR, EST AFRICAN AMERICAN: 148 mL/min/{1.73_m2} (ref >59)
GFR, EST NON AFRICAN AMERICAN: 128 mL/min/{1.73_m2} (ref >59)
GGT: 25 IU/L (ref 0–60)
GLOBULIN, TOTAL: 2.5 g/dL (ref 1.5–4.5)
Glucose: 81 mg/dL (ref 65–99)
HDL: 56 mg/dL (ref >39)
HEMOGLOBIN: 12.3 g/dL (ref 11.1–15.9)
Hematocrit: 37.4 % (ref 34.0–46.6)
IMMATURE GRANS (ABS): 0 10*3/uL (ref 0.0–0.1)
IRON: 91 ug/dL (ref 27–159)
Immature Granulocytes: 0 %
LDH: 136 IU/L (ref 119–226)
LDL Calculated: 97 mg/dL (ref 0–99)
LYMPHS: 34 %
Lymphocytes Absolute: 1.9 10*3/uL (ref 0.7–3.1)
MCH: 28.7 pg (ref 26.6–33.0)
MCHC: 32.9 g/dL (ref 31.5–35.7)
MCV: 87 fL (ref 79–97)
MONOCYTES: 10 %
Monocytes Absolute: 0.6 10*3/uL (ref 0.1–0.9)
Neutrophils Absolute: 3.1 10*3/uL (ref 1.4–7.0)
Neutrophils: 56 %
PLATELETS: 166 10*3/uL (ref 150–379)
Phosphorus: 3 mg/dL (ref 2.5–4.5)
Potassium: 4.3 mmol/L (ref 3.5–5.2)
RBC: 4.28 x10E6/uL (ref 3.77–5.28)
RDW: 13.4 % (ref 12.3–15.4)
Sodium: 139 mmol/L (ref 136–144)
T3 UPTAKE RATIO: 21 % — AB (ref 24–39)
T4, Total: 11.3 ug/dL (ref 4.5–12.0)
TOTAL PROTEIN: 6.5 g/dL (ref 6.0–8.5)
TSH: 1.87 u[IU]/mL (ref 0.450–4.500)
Triglycerides: 83 mg/dL (ref 0–149)
Uric Acid: 4.3 mg/dL (ref 2.5–7.1)
VLDL CHOLESTEROL CAL: 17 mg/dL (ref 5–40)
WBC: 5.6 10*3/uL (ref 3.4–10.8)

## 2015-05-03 LAB — VITAMIN D 25 HYDROXY (VIT D DEFICIENCY, FRACTURES): VIT D 25 HYDROXY: 29.3 ng/mL — AB (ref 30.0–100.0)

## 2015-05-04 NOTE — Progress Notes (Signed)
I left a voicemail message for patient on her cellphone.

## 2015-05-27 ENCOUNTER — Ambulatory Visit: Payer: Self-pay | Admitting: Physician Assistant

## 2015-05-27 ENCOUNTER — Encounter: Payer: Self-pay | Admitting: Physician Assistant

## 2015-05-27 VITALS — BP 110/60 | HR 87 | Temp 97.9°F

## 2015-05-27 DIAGNOSIS — J069 Acute upper respiratory infection, unspecified: Secondary | ICD-10-CM

## 2015-05-27 NOTE — Progress Notes (Signed)
S/ mild cold sx started today, hoarseness , slight cough , nasal congestion  no fever , gi sxs  O/ VSS NAD ENT unremarkable, neck supple heart rsr lungs clear A/ URI P/  viral course discussed and supportive measures encouraged.

## 2015-05-30 HISTORY — PX: BREAST BIOPSY: SHX20

## 2015-06-01 NOTE — Progress Notes (Signed)
Patient ID: Ruth Tran, female   DOB: [birth year], 25 y.o.   MRN: [id number] Patient called and expressed that she has developed a bad cough and is coughing up chunks of green sputum.  I informed Ashok Cordia, PA-C and she authorized me to call in [facility] [place] and inform patient that she needs to take over the counter Mucinex. I called patient and left a message of her voicemail regarding Susan's instructions.

## 2015-06-24 ENCOUNTER — Telehealth: Payer: Self-pay | Admitting: Physician Assistant

## 2015-06-24 ENCOUNTER — Other Ambulatory Visit: Payer: Self-pay | Admitting: Physician Assistant

## 2015-06-24 MED ORDER — SERTRALINE HCL 50 MG PO TABS: 50.0000 mg | ORAL_TABLET | Freq: Every day | ORAL | Status: DC

## 2015-06-24 NOTE — Telephone Encounter (Signed)
Called pt, got a note from her therapist stating she is having panic attacks, medication advised; pt is to call clinic and advise which pharmacy we need to send rx to; will start on zoloft

## 2015-06-24 NOTE — Progress Notes (Signed)
Pt called and said to send zoloft rx to [facility] in [place], recently dx with anxiety/panic d/o

## 2015-08-10 ENCOUNTER — Encounter: Payer: Self-pay | Admitting: Physician Assistant

## 2015-08-10 ENCOUNTER — Ambulatory Visit: Payer: Self-pay | Admitting: Physician Assistant

## 2015-08-10 VITALS — BP 110/70 | Temp 97.7°F

## 2015-08-10 DIAGNOSIS — J018 Other acute sinusitis: Secondary | ICD-10-CM

## 2015-08-10 MED ORDER — CEFDINIR 300 MG PO CAPS: 300.0000 mg | ORAL_CAPSULE | Freq: Two times a day (BID) | ORAL | Status: AC

## 2015-08-10 NOTE — Progress Notes (Signed)
S: C/o runny nose and congestion for 3 days, no fever, chills, cp/sob, v/d; mucus is green and thick, cough is sporadic, c/o of facial and dental pain.   Using otc meds:   O: PE: vitals wnl, nad, perrl eomi, normocephalic, tms dull, nasal mucosa red and swollen, throat injected, neck supple no lymph, lungs c t a, cv rrr, neuro intact  A:  Acute sinusitis   P: omnicef 300mg  bid x 10d, drink fluids, continue regular meds , use otc meds of choice, return if not improving in 5 days, return earlier if worsening

## 2015-08-11 ENCOUNTER — Encounter: Payer: PRIVATE HEALTH INSURANCE | Admitting: Obstetrics and Gynecology

## 2015-08-25 ENCOUNTER — Encounter: Payer: PRIVATE HEALTH INSURANCE | Admitting: Obstetrics and Gynecology

## 2015-10-12 ENCOUNTER — Ambulatory Visit: Payer: Self-pay | Admitting: Physician Assistant

## 2015-10-12 ENCOUNTER — Encounter: Payer: Self-pay | Admitting: Physician Assistant

## 2015-10-12 VITALS — BP 105/60 | HR 87 | Temp 98.7°F

## 2015-10-12 DIAGNOSIS — F411 Generalized anxiety disorder: Secondary | ICD-10-CM

## 2015-10-12 DIAGNOSIS — J069 Acute upper respiratory infection, unspecified: Secondary | ICD-10-CM

## 2015-10-12 MED ORDER — AZITHROMYCIN 250 MG PO TABS: ORAL_TABLET | ORAL | Status: DC

## 2015-10-12 MED ORDER — BUPROPION HCL ER (XL) 150 MG PO TB24: 150.0000 mg | ORAL_TABLET | Freq: Every day | ORAL | Status: DC

## 2015-10-12 NOTE — Progress Notes (Signed)
S: C/o runny nose and congestion for several days, no fever, chills, cp/sob, v/d; mucus was green this am and last night, also still having trouble with anxiety and dif focusing during school, EAP counselor sent a note stating a good choice would be wellbutrin since hasn't had add testing   Using otc meds: robitussin  O: PE: vitals wnl, perrl eomi, normocephalic, tms dull, nasal mucosa red and swollen, throat injected, neck supple no lymph, lungs c t a, cv rrr, neuro intact, good mood and affect  A:  Acute uri, anxiety, dif concentrating  P: zpack, wellbutrin xl 150mg  qd;  drink fluids, continue regular meds , use otc meds of choice, return if not improving in 5 days, return earlier if worsening

## 2015-10-20 ENCOUNTER — Other Ambulatory Visit: Payer: Self-pay | Admitting: Physician Assistant

## 2015-10-21 NOTE — Telephone Encounter (Signed)
Med refill approved 

## 2015-11-01 ENCOUNTER — Other Ambulatory Visit: Payer: Self-pay | Admitting: Obstetrics and Gynecology

## 2015-11-01 DIAGNOSIS — N6310 Unspecified lump in the right breast, unspecified quadrant: Principal | ICD-10-CM

## 2015-11-01 DIAGNOSIS — N6315 Unspecified lump in the right breast, overlapping quadrants: Secondary | ICD-10-CM

## 2015-11-03 ENCOUNTER — Ambulatory Visit
Admission: RE | Admit: 2015-11-03 | Discharge: 2015-11-03 | Disposition: A | Discharge status: Home / Self Care | Payer: Managed Care, Other (non HMO) | Source: Ambulatory Visit | Attending: Obstetrics and Gynecology | Admitting: Obstetrics and Gynecology

## 2015-11-03 DIAGNOSIS — N63 Unspecified lump in breast: Secondary | ICD-10-CM | POA: Diagnosis present

## 2015-11-03 DIAGNOSIS — N6310 Unspecified lump in the right breast, unspecified quadrant: Secondary | ICD-10-CM

## 2015-11-03 DIAGNOSIS — N6315 Unspecified lump in the right breast, overlapping quadrants: Secondary | ICD-10-CM

## 2015-11-08 ENCOUNTER — Other Ambulatory Visit: Payer: Self-pay | Admitting: Obstetrics and Gynecology

## 2015-11-08 DIAGNOSIS — N63 Unspecified lump in unspecified breast: Secondary | ICD-10-CM

## 2015-11-29 ENCOUNTER — Ambulatory Visit
Admission: RE | Admit: 2015-11-29 | Discharge: 2015-11-29 | Disposition: A | Discharge status: Home / Self Care | Payer: Managed Care, Other (non HMO) | Source: Ambulatory Visit | Attending: Obstetrics and Gynecology | Admitting: Obstetrics and Gynecology

## 2015-11-29 DIAGNOSIS — N63 Unspecified lump in unspecified breast: Secondary | ICD-10-CM

## 2015-12-03 ENCOUNTER — Other Ambulatory Visit: Payer: Self-pay | Admitting: Obstetrics and Gynecology

## 2015-12-03 DIAGNOSIS — N6310 Unspecified lump in the right breast, unspecified quadrant: Secondary | ICD-10-CM

## 2015-12-07 ENCOUNTER — Ambulatory Visit: Payer: No Typology Code available for payment source

## 2015-12-10 ENCOUNTER — Ambulatory Visit
Admission: RE | Admit: 2015-12-10 | Discharge: 2015-12-10 | Disposition: A | Discharge status: Home / Self Care | Payer: Managed Care, Other (non HMO) | Source: Ambulatory Visit | Attending: Obstetrics and Gynecology | Admitting: Obstetrics and Gynecology

## 2015-12-10 ENCOUNTER — Encounter: Payer: Self-pay | Admitting: Emergency Medicine

## 2015-12-10 ENCOUNTER — Other Ambulatory Visit: Payer: Self-pay

## 2015-12-10 ENCOUNTER — Emergency Department
Admission: EM | Admit: 2015-12-10 | Discharge: 2015-12-10 | Disposition: A | Discharge status: Home / Self Care | Payer: Managed Care, Other (non HMO) | Attending: Emergency Medicine | Admitting: Emergency Medicine

## 2015-12-10 ENCOUNTER — Ambulatory Visit
Admission: RE | Admit: 2015-12-10 | Discharge: 2015-12-10 | Disposition: A | Discharge status: Home / Self Care | Payer: No Typology Code available for payment source | Source: Ambulatory Visit | Attending: Obstetrics and Gynecology | Admitting: Obstetrics and Gynecology

## 2015-12-10 DIAGNOSIS — Y69 Unspecified misadventure during surgical and medical care: Secondary | ICD-10-CM | POA: Diagnosis not present

## 2015-12-10 DIAGNOSIS — D241 Benign neoplasm of right breast: Secondary | ICD-10-CM | POA: Insufficient documentation

## 2015-12-10 DIAGNOSIS — T413X5A Adverse effect of local anesthetics, initial encounter: Secondary | ICD-10-CM | POA: Diagnosis not present

## 2015-12-10 DIAGNOSIS — T7840XA Allergy, unspecified, initial encounter: Secondary | ICD-10-CM | POA: Diagnosis not present

## 2015-12-10 DIAGNOSIS — Z79899 Other long term (current) drug therapy: Secondary | ICD-10-CM | POA: Diagnosis not present

## 2015-12-10 DIAGNOSIS — N63 Unspecified lump in breast: Secondary | ICD-10-CM

## 2015-12-10 DIAGNOSIS — N6310 Unspecified lump in the right breast, unspecified quadrant: Secondary | ICD-10-CM

## 2015-12-10 MED ORDER — SODIUM CHLORIDE 0.9 % IV BOLUS (SEPSIS)
1000.0000 mL | Freq: Once | INTRAVENOUS | Status: AC
  Administered 2015-12-10: 1000 mL via INTRAVENOUS

## 2015-12-10 MED ORDER — DIPHENHYDRAMINE HCL 50 MG/ML IJ SOLN
25.0000 mg | Freq: Once | INTRAMUSCULAR | Status: AC
  Administered 2015-12-10: 25 mg via INTRAVENOUS
  Filled 2015-12-10: qty 1

## 2015-12-10 MED ORDER — METHYLPREDNISOLONE SODIUM SUCC 125 MG IJ SOLR
125.0000 mg | Freq: Once | INTRAMUSCULAR | Status: AC
  Administered 2015-12-10: 125 mg via INTRAVENOUS
  Filled 2015-12-10: qty 2

## 2015-12-10 MED ORDER — IBUPROFEN 600 MG PO TABS
600.0000 mg | ORAL_TABLET | Freq: Once | ORAL | Status: AC
  Administered 2015-12-10: 600 mg via ORAL
  Filled 2015-12-10: qty 1

## 2015-12-10 MED ORDER — EPINEPHRINE 0.3 MG/0.3ML IJ SOAJ
0.3000 mg | Freq: Once | INTRAMUSCULAR | Status: AC
  Administered 2015-12-10: 0.3 mg via INTRAMUSCULAR
  Filled 2015-12-10: qty 0.3

## 2015-12-10 MED ORDER — PREDNISONE 10 MG PO TABS: ORAL_TABLET | ORAL | Status: DC

## 2015-12-10 MED ORDER — HYDROCODONE-ACETAMINOPHEN 5-325 MG PO TABS
1.0000 | ORAL_TABLET | Freq: Once | ORAL | Status: AC
  Administered 2015-12-10: 1 via ORAL
  Filled 2015-12-10: qty 1

## 2015-12-10 MED ORDER — FAMOTIDINE IN NACL 20-0.9 MG/50ML-% IV SOLN
20.0000 mg | Freq: Once | INTRAVENOUS | Status: AC
  Administered 2015-12-10: 20 mg via INTRAVENOUS
  Filled 2015-12-10: qty 50

## 2015-12-10 NOTE — ED Provider Notes (Signed)
[facility] Emergency Department Provider Note   ____________________________________________  Time seen: Approximately 5:35pm I have reviewed the triage vital signs and the triage nursing note.  HISTORY  Chief Complaint Allergic Reaction   Historian Patient  HPI Ruth Tran is a 25 y.o. female who is here with symptoms of allergic reaction after receiving local anesthetic at the breast center during breast biopsies this morning around 8:30. Throughout the course of the day she developed fullness of the throat and tongue swelling. She's had some nausea. She is mild headache. No chest pain. No wheezing. No history of prior allergic reactions. She is having some pain in the nipple after biopsies.    Past Medical History  Diagnosis Date  . Family history of endometriosis     mother,sister  . Dysmenorrhea   . Graves disease   . Panic attack     Patient Active Problem List   Diagnosis Date Noted  . Graves disease 01/22/2014  . Unspecified vitamin D deficiency 01/22/2014    Past Surgical History  Procedure Laterality Date  . Appendectomy      Current Outpatient Rx  Name  Route  Sig  Dispense  Refill  . buPROPion (WELLBUTRIN XL) 150 MG 24 hr tablet   Oral   Take 1 tablet (150 mg total) by mouth daily.   30 tablet   6   . norethindrone-ethinyl estradiol (MICROGESTIN,JUNEL,LOESTRIN) 1-20 MG-MCG tablet   Oral   Take 1 tablet by mouth daily.         . sertraline (ZOLOFT) 50 MG tablet   Oral   Take 50 mg by mouth daily.         . predniSONE (DELTASONE) 10 MG tablet      40 mg daily 2 days 30 mg daily 2 days 20 mg daily 2 days 10 mg daily 2 days   20 tablet   0     Allergies Latex and Tape  Family History  Problem Relation Age of Onset  . Diabetes Father   . Graves' disease Maternal Grandfather   . Graves' disease Other   . Ovarian cancer Paternal Grandmother   . Breast cancer Neg Hx   . Colon cancer Neg Hx   .  Heart disease Neg Hx     Social History Social History  Substance Use Topics  . Smoking status: Never Smoker   . Smokeless tobacco: None  . Alcohol Use: No    Review of Systems  Constitutional: Negative for fever. Eyes: Negative for visual changes. ENT: Negative for sore throat. Cardiovascular: Negative for chest pain. Respiratory: Negative for shortness of breath. Gastrointestinal: Negative for abdominal pain, vomiting and diarrhea. Genitourinary: Negative for dysuria. Musculoskeletal: Negative for back pain. Skin: Negative for rash. Neurological: Positive for headache. 10 point Review of Systems otherwise negative ____________________________________________   PHYSICAL EXAM:  VITAL SIGNS: ED Triage Vitals  Enc Vitals Group     BP 12/10/15 1727 116/66 mmHg     Pulse Rate 12/10/15 1727 87     Resp 12/10/15 1727 18     Temp 12/10/15 1727 98 F (36.7 C)     Temp Source 12/10/15 1727 Oral     SpO2 12/10/15 1727 99 %     Weight 12/10/15 1727 155 lb (70.308 kg)     Height 12/10/15 1727 5\' 5"  (1.651 m)     Head Cir --      Peak Flow --      Pain Score 12/10/15 1728 7  Pain Loc --      Pain Edu? --      Excl. in [place]? --      Constitutional: Alert and oriented. Well appearing and in no distress. HEENT   Head: Normocephalic and atraumatic.      Eyes: Conjunctivae are normal. PERRL. Normal extraocular movements.      Ears:         Nose: No congestion/rhinnorhea.   Mouth/Throat: Mucous membranes are moist.  Soft tissues of the neck seem a little full, but soft. Tongue seems potentially little bit swollen. No pharyngeal erythema. Good visualization of the posterior oropharynx.  Voice sounds potentially a slight bit muffled.   Neck: No stridor. Cardiovascular/Chest: Normal rate, regular rhythm.  No murmurs, rubs, or gallops. Respiratory: Normal respiratory effort without tachypnea nor retractions. Breath sounds are clear and equal bilaterally. No  wheezes/rales/rhonchi. Gastrointestinal: Soft. No distention, no guarding, no rebound. Nontender.   Genitourinary/rectal:Deferred Musculoskeletal: Nontender with normal range of motion in all extremities. No joint effusions.  No lower extremity tenderness.  No edema. Neurologic: No facial droop. Speech sounds a little muffled, likely from tongue swelling. No gross or focal neurologic deficits are appreciated. Skin:  Skin is warm, dry and intact. No rash noted. Psychiatric: Mood and affect are normal. Speech and behavior are normal. Patient exhibits appropriate insight and judgment.  ____________________________________________   EKG I, Lisa Roca, MD, the attending physician have personally viewed and interpreted all ECGs.  None ____________________________________________  LABS (pertinent positives/negatives)  Labs Reviewed - No data to display  ____________________________________________  RADIOLOGY All Xrays were viewed by me. Imaging interpreted by Radiologist.  None __________________________________________  PROCEDURES  Procedure(s) performed: None  Critical Care performed: None  ____________________________________________   ED COURSE / ASSESSMENT AND PLAN  Pertinent labs & imaging results that were available during my care of the patient were reviewed by me and considered in my medical decision making (see chart for details).  This patient states that she had chloroform and lidocaine locally for skin biopsy, and is now having symptoms of third and tongue swelling, possibly allergic/drug reaction.  She took Tylenol for pain at home, but she still having some discomfort to the nipple where she was biopsied.  No hypotension, with wheezing or airway compromise at present. I am going to go ahead and give her IV medication including Solu-Medrol, Pepcid, and Benadryl due to the feeling of fullness in the throat and tongue swelling.  After receiving medications,  patient felt like her tongue a little bit more swollen, and she was given EpiPen. This did resolve her symptoms.  Patient was observed for 4 hours without recurrence of symptoms. I think this patient's okay for discharge. We discussed with patient and parents, dad is an EMT, the possibility of biphasic anaphylaxis and certainly to return to the ED and/or call 911 for any recurrence of anaphylactic symptoms.   11:10 PM. Patient not having anaphylactic or throat swelling sensation. She is still having headache. She apparently was told by the radiologist not to take ibuprofen after the biopsy, but I think to help with anti-inflammation it's reasonable for her to go ahead and take this for breast pain and headache. I am going to go ahead and give her a repeat dose of Benadryl not for new symptoms, just because it been over 4 hours to maintain histamine blockage coverage.  CONSULTATIONS:   None   Patient / Family / Caregiver informed of clinical course, medical decision-making process, and agree with plan.  I  discussed return precautions, follow-up instructions, and discharged instructions with patient and/or family.   ___________________________________________   FINAL CLINICAL IMPRESSION(S) / ED DIAGNOSES   Final diagnoses:  Allergic reaction caused by a drug              Note: This dictation was prepared with Dragon dictation. Any transcriptional errors that result from this process are unintentional   Lisa Roca, MD 12/10/15 2310

## 2015-12-10 NOTE — ED Notes (Signed)
Answered pt. Call bell,  Pt. States she feels tired and "Jittery".  Pt. States HA 7/10.

## 2015-12-10 NOTE — Discharge Instructions (Signed)
You're being treated for allergic reaction, possibly due to chloroform or lidocaine local anesthetic.  Take over-the-counter Benadryl 25 mg by mouth every 4 hours as needed for any swelling or itching, at least for 4 days.  Take over-the-counter Zantac by mouth once daily for 4 days.  Return to emergency department for any worsening condition, certainly any facial swelling, tongue swelling, trouble swallowing, trouble breathing, dizziness or passing out, or any other symptoms concerning to you.  Drug Allergy Allergic reactions to medicines are common. Some allergic reactions are mild. A delayed type of drug allergy that occurs 1 week or more after exposure to a medicine or vaccine is called serum sickness. A life-threatening, sudden (acute) allergic reaction that involves the whole body is called anaphylaxis. CAUSES  "True" drug allergies occur when there is an allergic reaction to a medicine. This is caused by overactivity of the immune system. First, the body becomes sensitized. The immune system is triggered by your first exposure to the medicine. Following this first exposure, future exposure to the same medicine may be life-threatening. Almost any medicine can cause an allergic reaction. Common ones are:  Penicillin.  Sulfonamides (sulfa drugs).  Local anesthetics.  X-ray dyes that contain iodine. SYMPTOMS  Common symptoms of a minor allergic reaction are:  Swelling around the mouth.  An itchy red rash or hives.  Vomiting or diarrhea. Anaphylaxis can cause swelling of the mouth and throat. This makes it difficult to breathe and swallow. Severe reactions can be fatal within seconds, even after exposure to only a trace amount of the drug that causes the reaction. HOME CARE INSTRUCTIONS  If you are unsure of what caused your reaction, write down:  The names of the medicines you took.  How much medicine you took.  How you took the medicine, such as whether you took a pill,  injected the medicine, or applied it to your skin.  All of the things you ate and drank.  The date and time of your reaction.  The symptoms of the reaction.  You may want to follow up with an allergy specialist after the reaction has cleared in order to be tested to confirm the allergy. It is important to confirm that your reaction is an allergy, not just a side effect to the medicine. If you have a true allergy to a medicine, this may prevent that medicine and related medicines from being given to you when you are very ill.  If you have hives or a rash:  Take medicines as directed by your caregiver.  You may use an over-the-counter antihistamine (diphenhydramine) as needed.  Apply cold compresses to the skin or take baths in cool water. Avoid hot baths or showers.  If you are severely allergic:  Continuous observation after a severe reaction may be needed. Hospitalization is often required.  Wear a medical alert bracelet or necklace stating your allergy.  You and your family must learn how to use an anaphylaxis kit or give an epinephrine injection to temporarily treat an emergency allergic reaction. If you have had a severe reaction, always carry your epinephrine injection or anaphylaxis kit with you. This can be lifesaving if you have a severe reaction.  Do not drive or perform tasks after treatment until the medicines used to treat your reaction have worn off, or until your caregiver says it is okay.  If you have a drug allergy that was confirmed by your health care provider:  Carry information about the drug allergy with you at  all times.  Always check with a pharmacist before taking any over-the-counter medicine. SEEK MEDICAL CARE IF:   You think you had an allergic reaction. Symptoms usually start within 30 minutes after exposure.  Symptoms are getting worse rather than better.  You develop new symptoms.  The symptoms that brought you to your caregiver return. SEEK  IMMEDIATE MEDICAL CARE IF:   You have swelling of the mouth, difficulty breathing, or wheezing.  You have a tight feeling in your chest or throat.  You develop hives, swelling, or itching all over your body.  You develop severe vomiting or diarrhea.  You feel faint or pass out. This is an emergency. Use your epinephrine injection or anaphylaxis kit as you have been instructed. Call for emergency medical help. Even if you improve after the injection, you need to be examined at a hospital emergency department. MAKE SURE YOU:   Understand these instructions.  Will watch your condition.  Will get help right away if you are not doing well or get worse.   This information is not intended to replace advice given to you by your health care provider. Make sure you discuss any questions you have with your health care provider.   Document Released: 05/15/2005 Document Revised: 06/05/2014 Document Reviewed: 12/15/2014 Elsevier Interactive Patient Education [employer].

## 2015-12-10 NOTE — ED Notes (Signed)
Patient states she had biopsy done this am to right breast. States since then, feels like tongue is swelling with mild difficulty swallowing.

## 2015-12-10 NOTE — ED Notes (Signed)
Pt. Going home with parents

## 2015-12-10 NOTE — Progress Notes (Signed)
Patient had a tissue removed from her right breast.  Due to the location of the incision and the fear of the incision opening, the patient asked if we can give her an arm sling so she can keep her right arm immobilized.  Size small splint was applied to right arm.

## 2015-12-13 LAB — SURGICAL PATHOLOGY

## 2015-12-16 ENCOUNTER — Ambulatory Visit: Payer: No Typology Code available for payment source

## 2016-03-17 ENCOUNTER — Ambulatory Visit: Payer: Self-pay | Admitting: Physician Assistant

## 2016-04-19 ENCOUNTER — Encounter (INDEPENDENT_AMBULATORY_CARE_PROVIDER_SITE_OTHER): Payer: Self-pay

## 2016-04-19 ENCOUNTER — Encounter: Payer: Self-pay | Admitting: Physician Assistant

## 2016-04-19 ENCOUNTER — Ambulatory Visit: Payer: Self-pay | Admitting: Physician Assistant

## 2016-04-19 VITALS — BP 103/59 | HR 84 | Temp 98.0°F | Ht 65.0 in | Wt 175.0 lb

## 2016-04-19 DIAGNOSIS — Z299 Encounter for prophylactic measures, unspecified: Secondary | ICD-10-CM

## 2016-04-19 DIAGNOSIS — Z0000 Encounter for general adult medical examination without abnormal findings: Secondary | ICD-10-CM

## 2016-04-19 NOTE — Progress Notes (Signed)
S: here for yearly labs and wellness check, states she doesn't have any complaints, ready for college to be over but no physical complaints, ros negative  O: vitals wnl, nad, appears well, ent wnl, neck supple no lymph, lungs c t a, cv rrr  A: wellness exam  P: labs exec panel, vit d

## 2016-04-20 LAB — CMP12+LP+TP+TSH+6AC+CBC/D/PLT
ALBUMIN: 4 g/dL (ref 3.5–5.5)
ALK PHOS: 59 IU/L (ref 39–117)
ALT: 7 IU/L (ref 0–32)
AST: 21 IU/L (ref 0–40)
Albumin/Globulin Ratio: 1.4 (ref 1.2–2.2)
BASOS: 0 %
BUN/Creatinine Ratio: 14 (ref 9–23)
BUN: 11 mg/dL (ref 6–20)
Basophils Absolute: 0 10*3/uL (ref 0.0–0.2)
Bilirubin Total: 0.6 mg/dL (ref 0.0–1.2)
CALCIUM: 9 mg/dL (ref 8.7–10.2)
CHLORIDE: 104 mmol/L (ref 96–106)
CHOLESTEROL TOTAL: 199 mg/dL (ref 100–199)
Chol/HDL Ratio: 3.4 ratio units (ref 0.0–4.4)
Creatinine, Ser: 0.77 mg/dL (ref 0.57–1.00)
EOS (ABSOLUTE): 0.1 10*3/uL (ref 0.0–0.4)
ESTIMATED CHD RISK: 0.5 times avg. (ref 0.0–1.0)
Eos: 1 %
FREE THYROXINE INDEX: 1.9 (ref 1.2–4.9)
GFR calc Af Amer: 126 mL/min/{1.73_m2} (ref >59)
GFR calc non Af Amer: 109 mL/min/{1.73_m2} (ref >59)
GGT: 20 IU/L (ref 0–60)
GLOBULIN, TOTAL: 2.8 g/dL (ref 1.5–4.5)
Glucose: 87 mg/dL (ref 65–99)
HDL: 58 mg/dL (ref >39)
HEMATOCRIT: 39.8 % (ref 34.0–46.6)
Hemoglobin: 12.8 g/dL (ref 11.1–15.9)
IRON: 69 ug/dL (ref 27–159)
Immature Grans (Abs): 0 10*3/uL (ref 0.0–0.1)
Immature Granulocytes: 0 %
LDH: 133 IU/L (ref 119–226)
LDL CALC: 127 mg/dL — AB (ref 0–99)
LYMPHS ABS: 2 10*3/uL (ref 0.7–3.1)
LYMPHS: 39 %
MCH: 28.6 pg (ref 26.6–33.0)
MCHC: 32.2 g/dL (ref 31.5–35.7)
MCV: 89 fL (ref 79–97)
MONOS ABS: 0.5 10*3/uL (ref 0.1–0.9)
Monocytes: 10 %
Neutrophils Absolute: 2.6 10*3/uL (ref 1.4–7.0)
Neutrophils: 50 %
PLATELETS: 165 10*3/uL (ref 150–379)
Phosphorus: 3.4 mg/dL (ref 2.5–4.5)
Potassium: 4.4 mmol/L (ref 3.5–5.2)
RBC: 4.47 x10E6/uL (ref 3.77–5.28)
RDW: 13.1 % (ref 12.3–15.4)
SODIUM: 145 mmol/L — AB (ref 134–144)
T3 UPTAKE RATIO: 20 % — AB (ref 24–39)
T4 TOTAL: 9.5 ug/dL (ref 4.5–12.0)
TOTAL PROTEIN: 6.8 g/dL (ref 6.0–8.5)
TRIGLYCERIDES: 70 mg/dL (ref 0–149)
TSH: 2.52 u[IU]/mL (ref 0.450–4.500)
Uric Acid: 4.4 mg/dL (ref 2.5–7.1)
VLDL Cholesterol Cal: 14 mg/dL (ref 5–40)
WBC: 5.2 10*3/uL (ref 3.4–10.8)

## 2016-04-20 LAB — VITAMIN D 25 HYDROXY (VIT D DEFICIENCY, FRACTURES): Vit D, 25-Hydroxy: 30.7 ng/mL (ref 30.0–100.0)

## 2016-05-31 ENCOUNTER — Ambulatory Visit: Payer: Self-pay | Admitting: Physician Assistant

## 2016-07-21 ENCOUNTER — Other Ambulatory Visit: Payer: Self-pay | Admitting: Physician Assistant

## 2016-07-21 DIAGNOSIS — F411 Generalized anxiety disorder: Secondary | ICD-10-CM

## 2016-07-21 NOTE — Telephone Encounter (Signed)
Med refill approved, changed to 90 day supply since pt is in college, it will be more convenient

## 2016-08-07 ENCOUNTER — Ambulatory Visit: Payer: Self-pay | Admitting: Physician Assistant

## 2016-08-09 ENCOUNTER — Other Ambulatory Visit: Payer: Self-pay | Admitting: Physician Assistant

## 2016-08-09 ENCOUNTER — Ambulatory Visit: Payer: Self-pay | Admitting: Physician Assistant

## 2016-08-09 ENCOUNTER — Encounter: Payer: Self-pay | Admitting: Physician Assistant

## 2016-08-09 VITALS — BP 100/60 | HR 64 | Temp 98.0°F

## 2016-08-09 DIAGNOSIS — R519 Headache, unspecified: Secondary | ICD-10-CM

## 2016-08-09 DIAGNOSIS — R51 Headache: Principal | ICD-10-CM

## 2016-08-09 MED ORDER — SUMATRIPTAN SUCCINATE 100 MG PO TABS: ORAL_TABLET | ORAL | 0 refills | Status: DC

## 2016-08-09 NOTE — Telephone Encounter (Signed)
Med refill approved 

## 2016-08-09 NOTE — Progress Notes (Signed)
S: c/o headaches, states they are happening when she's in class, pain will be in her eye, then will feel like her eyes are bulging even though they aren't, will cause a visual change and won't be able to see the board, some are resolved with advil, some with sleep, last week had one for 3 days in a row, denies n/v with headache, no hx of migraines,   O: vitals wnl, nad, perrl eomi, tms clear, nasal mucosa wnl, throat wnl, neck supple no lymph,l ungsc  ta ,c v rrr, grips = b/l, trapezious muscles are spasmed b/l, cn II-XII intact  A: new onset migraines  P: trial of imitrex, if does not respond to imitrex will refer to neuro

## 2016-10-03 ENCOUNTER — Other Ambulatory Visit: Payer: Self-pay | Admitting: Emergency Medicine

## 2016-10-03 MED ORDER — SUMATRIPTAN SUCCINATE 100 MG PO TABS: ORAL_TABLET | ORAL | 5 refills | Status: DC

## 2016-10-03 NOTE — Telephone Encounter (Signed)
Med refill for imitrex approved, [facility]

## 2017-01-26 ENCOUNTER — Other Ambulatory Visit: Payer: Self-pay

## 2017-02-26 ENCOUNTER — Ambulatory Visit: Payer: Self-pay | Admitting: Registered Nurse

## 2017-02-26 VITALS — BP 120/70 | HR 78 | Temp 98.0°F | Resp 16

## 2017-02-26 DIAGNOSIS — J039 Acute tonsillitis, unspecified: Secondary | ICD-10-CM

## 2017-02-26 MED ORDER — AMOXICILLIN 875 MG PO TABS: 875.0000 mg | ORAL_TABLET | Freq: Two times a day (BID) | ORAL | 0 refills | Status: DC

## 2017-02-26 MED ORDER — ACETAMINOPHEN 500 MG PO TABS: 1000.0000 mg | ORAL_TABLET | Freq: Four times a day (QID) | ORAL | 0 refills | Status: AC | PRN

## 2017-02-26 MED ORDER — PREDNISONE 20 MG PO TABS: 40.0000 mg | ORAL_TABLET | Freq: Every day | ORAL | 0 refills | Status: AC

## 2017-02-26 NOTE — Progress Notes (Signed)
Subjective:    Patient ID: Ruth Tran, female    DOB: 10-Apr-1991, 26 y.o.   MRN: [id number]  25y/o single caucasian female student established patient here for sore throat x 2 weeks not responding to OTC cold medications. Pain with swallowing; denied drooling, shortness of breath, fever, sweats, chills, headache, wheezing, vomiting. Last tonsil problems years ago.  Wondering if she has tonsil stones.  PMHx mononucleosis denied tonsil enlargement at that time;       Review of Systems  Constitutional: Negative for activity change, appetite change, chills, diaphoresis, fatigue, fever and unexpected weight change.  HENT: Positive for sore throat. Negative for congestion, dental problem, drooling, ear discharge, ear pain, facial swelling, hearing loss, mouth sores, nosebleeds, postnasal drip, rhinorrhea, sinus pain, sinus pressure, sneezing, tinnitus, trouble swallowing and voice change.   Eyes: Negative for photophobia, pain, discharge, redness, itching and visual disturbance.  Respiratory: Negative for cough, choking, chest tightness, shortness of breath, wheezing and stridor.   Cardiovascular: Negative for chest pain, palpitations and leg swelling.  Gastrointestinal: Negative for abdominal distention, abdominal pain, blood in stool, constipation, diarrhea, nausea and vomiting.  Endocrine: Negative for cold intolerance and heat intolerance.  Genitourinary: Negative for difficulty urinating, dysuria and hematuria.  Musculoskeletal: Negative for arthralgias, back pain, gait problem, joint swelling, myalgias, neck pain and neck stiffness.  Skin: Negative for color change, pallor, rash and wound.  Allergic/Immunologic: Negative for environmental allergies and food allergies.  Neurological: Negative for dizziness, tremors, seizures, syncope, facial asymmetry, speech difficulty, weakness, light-headedness, numbness and headaches.  Hematological: Negative for adenopathy. Does not bruise/bleed  easily.  Psychiatric/Behavioral: Negative for agitation, behavioral problems, confusion and sleep disturbance.       Objective:   Physical Exam  Constitutional: She is oriented to person, place, and time. Vital signs are normal. She appears well-developed and well-nourished. She is active and cooperative.  Non-toxic appearance. She does not have a sickly appearance. She does not appear ill. No distress.  HENT:  Head: Normocephalic and atraumatic.  Right Ear: Hearing, external ear and ear canal normal. A middle ear effusion is present.  Left Ear: Hearing, external ear and ear canal normal. A middle ear effusion is present.  Nose: Mucosal edema and rhinorrhea present. No nose lacerations, sinus tenderness, nasal deformity, septal deviation or nasal septal hematoma. No epistaxis.  No foreign bodies. Right sinus exhibits maxillary sinus tenderness. Right sinus exhibits no frontal sinus tenderness. Left sinus exhibits maxillary sinus tenderness. Left sinus exhibits no frontal sinus tenderness.  Mouth/Throat: Uvula is midline and mucous membranes are normal. Mucous membranes are not pale, not dry and not cyanotic. She does not have dentures. No oral lesions. No trismus in the jaw. Normal dentition. No dental abscesses, uvula swelling, lacerations or dental caries. Posterior oropharyngeal edema and posterior oropharyngeal erythema present. No oropharyngeal exudate or tonsillar abscesses.  Bilateral tonsils enlarged/erythema 2-3+/4 bilaterally; cobblestoning posterior pharynx; bilateral allergic shiners; bilateral TMs air fluid level clear; bilateral nasal turbinates edema/erythema clear discharge  Eyes: Pupils are equal, round, and reactive to light. Conjunctivae, EOM and lids are normal. Right eye exhibits no chemosis, no discharge, no exudate and no hordeolum. No foreign body present in the right eye. Left eye exhibits no chemosis, no discharge, no exudate and no hordeolum. No foreign body present in the  left eye. Right conjunctiva is not injected. Right conjunctiva has no hemorrhage. Left conjunctiva is not injected. Left conjunctiva has no hemorrhage. No scleral icterus. Right eye exhibits normal extraocular motion and no nystagmus.  Left eye exhibits normal extraocular motion and no nystagmus. Right pupil is round and reactive. Left pupil is round and reactive. Pupils are equal.  Neck: Trachea normal and normal range of motion. Neck supple. No tracheal tenderness and no muscular tenderness present. No neck rigidity. No tracheal deviation, no edema, no erythema and normal range of motion present. No thyroid mass and no thyromegaly present.  Voice slightly hoarse  Cardiovascular: Normal rate, regular rhythm, S1 normal, S2 normal, normal heart sounds and intact distal pulses.  PMI is not displaced.  Exam reveals no gallop and no friction rub.   No murmur heard. Pulmonary/Chest: Effort normal and breath sounds normal. No accessory muscle usage or stridor. No respiratory distress. She has no decreased breath sounds. She has no wheezes. She has no rhonchi. She has no rales. She exhibits no tenderness.  Spoke full sentences without difficulty; cough not observed in exam room  Abdominal: Normal appearance. She exhibits no distension, no fluid wave and no ascites. There is no rigidity and no guarding.  Musculoskeletal: Normal range of motion. She exhibits no edema or tenderness.       Right shoulder: Normal.       Left shoulder: Normal.       Right elbow: Normal.      Left elbow: Normal.       Right hip: Normal.       Left hip: Normal.       Right knee: Normal.       Left knee: Normal.       Cervical back: Normal.       Thoracic back: Normal.       Lumbar back: Normal.       Right hand: Normal.       Left hand: Normal.  Lymphadenopathy:       Head (right side): No submental, no submandibular, no tonsillar, no preauricular, no posterior auricular and no occipital adenopathy present.       Head (left  side): No submental, no submandibular, no tonsillar, no preauricular, no posterior auricular and no occipital adenopathy present.    She has no cervical adenopathy.       Right cervical: No superficial cervical, no deep cervical and no posterior cervical adenopathy present.      Left cervical: No superficial cervical, no deep cervical and no posterior cervical adenopathy present.  Neurological: She is alert and oriented to person, place, and time. She has normal strength. She is not disoriented. She displays no atrophy and no tremor. No cranial nerve deficit or sensory deficit. She exhibits normal muscle tone. She displays no seizure activity. Coordination and gait normal. GCS eye subscore is 4. GCS verbal subscore is 5. GCS motor subscore is 6.  On/off exam table without difficulty; gait sure and steady in hall  Skin: Skin is warm, dry and intact. No abrasion, no bruising, no burn, no ecchymosis, no laceration, no lesion, no petechiae and no rash noted. She is not diaphoretic. No cyanosis or erythema. No pallor. Nails show no clubbing.  Psychiatric: She has a normal mood and affect. Her speech is normal and behavior is normal. Judgment and thought content normal. Cognition and memory are normal.  Nursing note and vitals reviewed.         Assessment & Plan:  A-acute tonsillitis  P-prednisone 40mg  po daily with breakfast x 5 days #10 20mg  RF0.  Amoxicillin 875mg  po BID x 10 days #20 RF0, tylenol 1000mg  po QID prn pain/fever.  Restart OTC antihistamine  of choice e.g. Claritin/zyrtec 10mg  po daily. Change toothbrush on last day of antibiotic. Consider dukes mouthwash 51ml swish/swallow po TID prn pain.    Usually no specific medical treatment is needed if a virus is causing the sore throat.  The throat most often gets better on its own within 5 to 7 days.  Antibiotic medicine does not cure viral pharyngitis; discussed sometime viral illnesses will cause enlarged tonsils also.  Post nasal drip can  cause sore throat. For acute pharyngitis caused by bacteria, your healthcare provider will prescribe an antibiotic.  Marland Kitchen Do not smoke.  Marland Kitchen Avoid secondhand smoke and other air pollutants.  . Use a cool mist humidifier to add moisture to the air.  . Get plenty of rest.  . You may want to rest your throat by talking less and eating a diet that is mostly liquid or soft for a day or two.  Hydrate to keep urine clear pale yellow . Nonprescription throat lozenges and mouthwashes should help relieve the soreness.   . Gargling with warm saltwater and drinking warm liquids may help.  (You can make a saltwater solution by adding 1/4 teaspoon of salt to 8 ounces, or 240 mL, of warm water.)  . A nonprescription pain reliever such as tylenol/acetaminophen may ease general aches and pains.   FOLLOW UP with clinic provider if no improvements in the next 7-10 days.  ER precautions given to patient-drooling, wheezing, dyspnea, worsening dysphagia.  Exitcare handout on tonsillitis given to patient. Patient verbalized understanding of instructions and agreed with plan of care.  Patient had no further questions at this time. P2:  Hand washing and diet.

## 2017-02-26 NOTE — Patient Instructions (Signed)
Tonsillitis Tonsillitis is an infection of the throat that causes the tonsils to become red, tender, and swollen. Tonsils are collections of lymphoid tissue at the back of the throat. Each tonsil has crevices (crypts). Tonsils help fight nose and throat infections and keep infection from spreading to other parts of the body for the first 18 months of life. What are the causes? Sudden (acute) tonsillitis is usually caused by infection with streptococcal bacteria. Long-lasting (chronic) tonsillitis occurs when the crypts of the tonsils become filled with pieces of food and bacteria, which makes it easy for the tonsils to become repeatedly infected. What are the signs or symptoms? Symptoms of tonsillitis include:  A sore throat, with possible difficulty swallowing.  White patches on the tonsils.  Fever.  Tiredness.  New episodes of snoring during sleep, when you did not snore before.  Small, foul-smelling, yellowish-white pieces of material (tonsilloliths) that you occasionally cough up or spit out. The tonsilloliths can also cause you to have bad breath.  How is this diagnosed? Tonsillitis can be diagnosed through a physical exam. Diagnosis can be confirmed with the results of lab tests, including a throat culture. How is this treated? The goals of tonsillitis treatment include the reduction of the severity and duration of symptoms and prevention of associated conditions. Symptoms of tonsillitis can be improved with the use of steroids to reduce the swelling. Tonsillitis caused by bacteria can be treated with antibiotic medicines. Usually, treatment with antibiotic medicines is started before the cause of the tonsillitis is known. However, if it is determined that the cause is not bacterial, antibiotic medicines will not treat the tonsillitis. If attacks of tonsillitis are severe and frequent, your health care provider may recommend surgery to remove the tonsils (tonsillectomy). Follow these  instructions at home:  Rest as much as possible and get plenty of sleep.  Drink plenty of fluids. While the throat is very sore, eat soft foods or liquids, such as sherbet, soups, or instant breakfast drinks.  Eat frozen ice pops.  Gargle with a warm or cold liquid to help soothe the throat. Mix 1/4 teaspoon of salt and 1/4 teaspoon of baking soda in 8 oz of water. Contact a health care provider if:  Large, tender lumps develop in your neck.  A rash develops.  A green, yellow-brown, or bloody substance is coughed up.  You are unable to swallow liquids or food for 24 hours.  You notice that only one of the tonsils is swollen. Get help right away if:  You develop any new symptoms such as vomiting, severe headache, stiff neck, chest pain, or trouble breathing or swallowing.  You have severe throat pain along with drooling or voice changes.  You have severe pain, unrelieved with recommended medications.  You are unable to fully open the mouth.  You develop redness, swelling, or severe pain anywhere in the neck.  You have a fever. This information is not intended to replace advice given to you by your health care provider. Make sure you discuss any questions you have with your health care provider. Document Released: 02/22/2005 Document Revised: 10/21/2015 Document Reviewed: 11/01/2012 Elsevier Interactive Patient Education  2017 [employer]  

## 2017-03-13 ENCOUNTER — Other Ambulatory Visit: Payer: Self-pay

## 2017-03-13 DIAGNOSIS — R110 Nausea: Secondary | ICD-10-CM

## 2017-03-13 LAB — POCT URINE PREGNANCY: Preg Test, Ur: NEGATIVE

## 2017-03-13 NOTE — Progress Notes (Signed)
Patient came in and requested a pregnancy test to be done.  She expressed that she didn't need to see the provider.

## 2017-03-14 ENCOUNTER — Ambulatory Visit: Payer: Self-pay | Admitting: Physician Assistant

## 2017-03-15 ENCOUNTER — Encounter: Payer: Self-pay | Admitting: Physician Assistant

## 2017-03-15 ENCOUNTER — Ambulatory Visit: Payer: Self-pay | Admitting: Physician Assistant

## 2017-03-15 VITALS — BP 100/72 | HR 80 | Temp 98.8°F

## 2017-03-15 DIAGNOSIS — K122 Cellulitis and abscess of mouth: Secondary | ICD-10-CM

## 2017-03-15 NOTE — Progress Notes (Signed)
Referral for ENT has been faxed to [facility].

## 2017-03-15 NOTE — Progress Notes (Signed)
   [place]    Patient ID: Ruth Tran, female    DOB: [birth year], 26 y.o.   MRN: [id number]  HPI Patient c/o enlarge Uvula greater than 2 weeks. Compliant unsolved  antibiotic and Steriods. Able to tolerate food and fluids.    Review of Systems    negative except for compliant. Objective:   Physical Exam Edematous uvula and tonsils. No ervical adenopathy. Lungs CTA and Heart RRR.       Assessment & Plan:Uvulitis  Consult to ENT.

## 2017-03-20 NOTE — Progress Notes (Signed)
Per Georgiana Spinner at [facility], patient's appointment with them is on 04/02/17 at 10:15am.  Patient has been notified by their office.

## 2017-04-11 ENCOUNTER — Inpatient Hospital Stay: Admission: RE | Admit: 2017-04-11 | Payer: Self-pay | Source: Ambulatory Visit

## 2017-04-13 ENCOUNTER — Other Ambulatory Visit: Payer: Self-pay

## 2017-04-13 ENCOUNTER — Encounter
Admission: RE | Admit: 2017-04-13 | Discharge: 2017-04-13 | Disposition: A | Discharge status: Home / Self Care | Payer: No Typology Code available for payment source | Source: Ambulatory Visit | Attending: Unknown Physician Specialty | Admitting: Unknown Physician Specialty

## 2017-04-13 HISTORY — DX: Headache, unspecified: R51.9

## 2017-04-13 HISTORY — DX: Headache: R51

## 2017-04-13 HISTORY — DX: Adverse effect of unspecified anesthetic, initial encounter: T41.45XA

## 2017-04-13 HISTORY — DX: Other complications of anesthesia, initial encounter: T88.59XA

## 2017-04-13 NOTE — Patient Instructions (Signed)
  Your procedure is scheduled on: 04-17-17 Report to Same Day Surgery 2nd floor medical mall [facility] Entrance-take elevator on left to 2nd floor.  Check in with surgery information desk.) To find out your arrival time please call [phone] between 1PM - 3PM on 04-16-17  Remember: Instructions that are not followed completely may result in serious medical risk, up to and including death, or upon the discretion of your surgeon and anesthesiologist your surgery may need to be rescheduled.    _x___ 1. Do not eat food after midnight the night before your procedure. You may drink clear liquids up to 2 hours before you are scheduled to arrive at the hospital for your procedure.  Do not drink clear liquids within 2 hours of your scheduled arrival to the hospital.  Clear liquids include  --Water or Apple juice without pulp  --Clear carbohydrate beverage such as ClearFast or Gatorade  --Black Coffee or Clear Tea (No milk, no creamers, do not add anything to the coffee or Tea   No gum chewing or hard candies.     __x__ 2. No Alcohol for 24 hours before or after surgery.   __x__3. No Smoking for 24 prior to surgery.   ____  4. Bring all medications with you on the day of surgery if instructed.    __x__ 5. Notify your doctor if there is any change in your medical condition     (cold, fever, infections).     Do not wear jewelry, make-up, hairpins, clips or nail polish.  Do not wear lotions, powders, or perfumes. You may wear deodorant.  Do not shave 48 hours prior to surgery. Men may shave face and neck.  Do not bring valuables to the hospital.    [facility] is not responsible for any belongings or valuables.               Contacts, dentures or bridgework may not be worn into surgery.  Leave your suitcase in the car. After surgery it may be brought to your room.  For patients admitted to the hospital, discharge time is determined by your treatment team.   Patients discharged the day  of surgery will not be allowed to drive home.  You will need someone to drive you home and stay with you the night of your procedure.   ____ Take anti-hypertensive listed below, cardiac, seizure, asthma, anti-reflux and psychiatric medicines. These include:  1. NONE  2.  3.  4.  5.  6.  ____Fleets enema or Magnesium Citrate as directed.   ____ Use CHG Soap or sage wipes as directed on instruction sheet   ____ Use inhalers on the day of surgery and bring to hospital day of surgery  ____ Stop Metformin and Janumet 2 days prior to surgery.    ____ Take 1/2 of usual insulin dose the night before surgery and none on the morning surgery.   ____ Follow recommendations from Cardiologist, Pulmonologist or PCP regarding stopping Aspirin, Coumadin, Plavix ,Eliquis, Effient, or Pradaxa, and Pletal.  X____Stop Anti-inflammatories such as Advil, Aleve, Ibuprofen, Motrin, Naproxen, Naprosyn, Goodies powders or aspirin products NOW-OK to take Tylenol    ____ Stop supplements until after surgery.    ____ Bring C-Pap to the hospital.

## 2017-04-17 ENCOUNTER — Ambulatory Visit: Payer: Managed Care, Other (non HMO) | Admitting: Registered Nurse

## 2017-04-17 ENCOUNTER — Encounter: Payer: Self-pay | Admitting: *Deleted

## 2017-04-17 ENCOUNTER — Ambulatory Visit
Admission: RE | Admit: 2017-04-17 | Discharge: 2017-04-17 | Disposition: A | Discharge status: Home / Self Care | Payer: Managed Care, Other (non HMO) | Source: Ambulatory Visit | Attending: Unknown Physician Specialty | Admitting: Unknown Physician Specialty

## 2017-04-17 ENCOUNTER — Encounter
Admission: RE | Disposition: A | Discharge status: Home / Self Care | Payer: Self-pay | Source: Ambulatory Visit | Attending: Unknown Physician Specialty

## 2017-04-17 DIAGNOSIS — E0590 Thyrotoxicosis, unspecified without thyrotoxic crisis or storm: Secondary | ICD-10-CM | POA: Diagnosis not present

## 2017-04-17 DIAGNOSIS — Z91040 Latex allergy status: Secondary | ICD-10-CM | POA: Diagnosis not present

## 2017-04-17 DIAGNOSIS — Z888 Allergy status to other drugs, medicaments and biological substances status: Secondary | ICD-10-CM | POA: Diagnosis not present

## 2017-04-17 DIAGNOSIS — Z79899 Other long term (current) drug therapy: Secondary | ICD-10-CM | POA: Diagnosis not present

## 2017-04-17 DIAGNOSIS — J3501 Chronic tonsillitis: Secondary | ICD-10-CM | POA: Insufficient documentation

## 2017-04-17 DIAGNOSIS — J353 Hypertrophy of tonsils with hypertrophy of adenoids: Secondary | ICD-10-CM | POA: Diagnosis not present

## 2017-04-17 DIAGNOSIS — Z884 Allergy status to anesthetic agent status: Secondary | ICD-10-CM | POA: Diagnosis not present

## 2017-04-17 HISTORY — PX: TONSILLECTOMY AND ADENOIDECTOMY: SHX28

## 2017-04-17 SURGERY — TONSILLECTOMY AND ADENOIDECTOMY
Anesthesia: General

## 2017-04-17 MED ORDER — HYDROCODONE-ACETAMINOPHEN 7.5-325 MG/15ML PO SOLN
ORAL | Status: AC
Start: 2017-04-17 — End: 2017-04-17
  Administered 2017-04-17: 10 mL via ORAL
  Filled 2017-04-17: qty 15

## 2017-04-17 MED ORDER — LIDOCAINE HCL (CARDIAC) 20 MG/ML IV SOLN: INTRAVENOUS | Status: DC | PRN

## 2017-04-17 MED ORDER — SUCCINYLCHOLINE CHLORIDE 20 MG/ML IJ SOLN
INTRAMUSCULAR | Status: AC
  Filled 2017-04-17: qty 1

## 2017-04-17 MED ORDER — FENTANYL CITRATE (PF) 100 MCG/2ML IJ SOLN
INTRAMUSCULAR | Status: DC | PRN
  Administered 2017-04-17 (×4): 50 ug via INTRAVENOUS

## 2017-04-17 MED ORDER — FAMOTIDINE 20 MG PO TABS
20.0000 mg | ORAL_TABLET | Freq: Once | ORAL | Status: AC
  Administered 2017-04-17: 20 mg via ORAL

## 2017-04-17 MED ORDER — FENTANYL CITRATE (PF) 100 MCG/2ML IJ SOLN
25.0000 ug | INTRAMUSCULAR | Status: DC | PRN
  Administered 2017-04-17 (×2): 25 ug via INTRAVENOUS

## 2017-04-17 MED ORDER — HYDROCODONE-ACETAMINOPHEN 7.5-325 MG/15ML PO SOLN: 15.0000 mL | Freq: Four times a day (QID) | ORAL | 0 refills | Status: DC | PRN

## 2017-04-17 MED ORDER — GLYCOPYRROLATE 0.2 MG/ML IJ SOLN
INTRAMUSCULAR | Status: DC | PRN
  Administered 2017-04-17: 0.2 mg via INTRAVENOUS

## 2017-04-17 MED ORDER — MIDAZOLAM HCL 2 MG/2ML IJ SOLN
INTRAMUSCULAR | Status: DC | PRN
  Administered 2017-04-17: 2 mg via INTRAVENOUS

## 2017-04-17 MED ORDER — ONDANSETRON HCL 4 MG/2ML IJ SOLN
INTRAMUSCULAR | Status: AC
  Filled 2017-04-17: qty 2

## 2017-04-17 MED ORDER — DEXAMETHASONE SODIUM PHOSPHATE 10 MG/ML IJ SOLN
INTRAMUSCULAR | Status: AC
  Filled 2017-04-17: qty 1

## 2017-04-17 MED ORDER — FENTANYL CITRATE (PF) 100 MCG/2ML IJ SOLN
INTRAMUSCULAR | Status: AC
  Filled 2017-04-17: qty 2

## 2017-04-17 MED ORDER — LIDOCAINE HCL (PF) 2 % IJ SOLN
INTRAMUSCULAR | Status: AC
  Filled 2017-04-17: qty 10

## 2017-04-17 MED ORDER — PROPOFOL 10 MG/ML IV BOLUS
INTRAVENOUS | Status: AC
  Filled 2017-04-17: qty 20

## 2017-04-17 MED ORDER — ONDANSETRON HCL 4 MG/2ML IJ SOLN
INTRAMUSCULAR | Status: DC | PRN
  Administered 2017-04-17: 4 mg via INTRAVENOUS

## 2017-04-17 MED ORDER — HYDROCODONE-ACETAMINOPHEN 7.5-325 MG/15ML PO SOLN
10.0000 mL | Freq: Four times a day (QID) | ORAL | Status: DC | PRN
  Administered 2017-04-17: 10 mL via ORAL
  Filled 2017-04-17: qty 15

## 2017-04-17 MED ORDER — SUCCINYLCHOLINE CHLORIDE 20 MG/ML IJ SOLN
INTRAMUSCULAR | Status: DC | PRN
  Administered 2017-04-17: 100 mg via INTRAVENOUS

## 2017-04-17 MED ORDER — MIDAZOLAM HCL 2 MG/2ML IJ SOLN
INTRAMUSCULAR | Status: AC
  Filled 2017-04-17: qty 2

## 2017-04-17 MED ORDER — FENTANYL CITRATE (PF) 100 MCG/2ML IJ SOLN
INTRAMUSCULAR | Status: AC
  Administered 2017-04-17: 25 ug via INTRAVENOUS
  Filled 2017-04-17: qty 2

## 2017-04-17 MED ORDER — LACTATED RINGERS IV SOLN
INTRAVENOUS | Status: DC
  Administered 2017-04-17: 50 mL/h via INTRAVENOUS

## 2017-04-17 MED ORDER — PROPOFOL 10 MG/ML IV BOLUS
INTRAVENOUS | Status: DC | PRN
  Administered 2017-04-17: 140 mg via INTRAVENOUS

## 2017-04-17 MED ORDER — DEXAMETHASONE SODIUM PHOSPHATE 10 MG/ML IJ SOLN
INTRAMUSCULAR | Status: DC | PRN
  Administered 2017-04-17: 10 mg via INTRAVENOUS

## 2017-04-17 MED ORDER — ONDANSETRON HCL 4 MG/2ML IJ SOLN: 4.0000 mg | Freq: Once | INTRAMUSCULAR | Status: DC | PRN

## 2017-04-17 MED ORDER — FAMOTIDINE 20 MG PO TABS
ORAL_TABLET | ORAL | Status: AC
  Filled 2017-04-17: qty 1

## 2017-04-17 MED ORDER — GLYCOPYRROLATE 0.2 MG/ML IJ SOLN
INTRAMUSCULAR | Status: AC
  Filled 2017-04-17: qty 1

## 2017-04-17 SURGICAL SUPPLY — 18 items
CANISTER SUCT 1200ML W/VALVE (MISCELLANEOUS) ×2 IMPLANT
CATH PEDI SILICON 3C 10FR (CATHETERS) ×2 IMPLANT
CATH ROBINSON RED A/P 8FR (CATHETERS) IMPLANT
COAG SUCT 10F 3.5MM HAND CTRL (MISCELLANEOUS) ×2 IMPLANT
ELECT CAUTERY BLADE TIP 2.5 (TIP) ×2
ELECT REM PT RETURN 9FT ADLT (ELECTROSURGICAL) ×2
ELECTRODE CAUTERY BLDE TIP 2.5 (TIP) ×1 IMPLANT
ELECTRODE REM PT RTRN 9FT ADLT (ELECTROSURGICAL) ×1 IMPLANT
GLOVE BIO SURGEON STRL SZ7.5 (GLOVE) IMPLANT
GLOVE SKINSENSE NS SZ7.5 (GLOVE) ×1
GLOVE SKINSENSE STRL SZ7.5 (GLOVE) ×1 IMPLANT
HANDLE SUCTION POOLE (INSTRUMENTS) ×1 IMPLANT
NS IRRIG 500ML POUR BTL (IV SOLUTION) ×2 IMPLANT
PACK HEAD/NECK (MISCELLANEOUS) ×2 IMPLANT
SOL ANTI-FOG 6CC FOG-OUT (MISCELLANEOUS) ×1 IMPLANT
SOL FOG-OUT ANTI-FOG 6CC (MISCELLANEOUS) ×1
SPONGE TONSIL 1 RF SGL (DISPOSABLE) ×2 IMPLANT
SUCTION POOLE HANDLE (INSTRUMENTS) ×2

## 2017-04-17 NOTE — Anesthesia Post-op Follow-up Note (Signed)
Anesthesia QCDR form completed.        

## 2017-04-17 NOTE — Op Note (Signed)
PREOPERATIVE DIAGNOSIS:  TONSIL HYPERTROPHY, CHRONIC TONSILLITIS  POSTOPERATIVE DIAGNOSIS: Same  OPERATION:  Tonsillectomy and adenoidectomy.  SURGEON:  Roena Malady, MD  ANESTHESIA:  General endotracheal.  OPERATIVE FINDINGS:  Large tonsils and adenoids.  DESCRIPTION OF THE PROCEDURE:  Ruth Tran was identified in the holding area and taken to the operating room and placed in the supine position.  After general endotracheal anesthesia, the table was turned 45 degrees and the patient was draped in the usual fashion for a tonsillectomy.  A mouth gag was inserted into the oral cavity and examination of the oropharynx showed the uvula was non-bifid.  There was no evidence of submucous cleft to the palate.  There were large tonsils.  A red rubber catheter was placed through the nostril.  Examination of the nasopharynx showed large obstructing adenoids.  Under indirect vision with the mirror, an adenotome was placed in the nasopharynx.  The adenoids were curetted free.  Reinspection with a mirror showed excellent removal of the adenoid.  Nasopharyngeal packs were then placed.  The operation then turned to the tonsillectomy.  Beginning on the left-hand side a tenaculum was used to grasp the tonsil and the Bovie cautery was used to dissect it free from the fossa.  In a similar fashion, the right tonsil was removed.  Meticulous hemostasis was achieved using the Bovie cautery.  With both tonsils removed and no active bleeding, the nasopharyngeal packs were removed.  Suction cautery was then used to cauterize the nasopharyngeal bed to prevent bleeding.  The red rubber catheter was removed with no active bleeding.   The patient tolerated the procedure well and was awakened in the operating room and taken to the recovery room in stable condition.   CULTURES:  None.  SPECIMENS:  Tonsils and adenoids.  ESTIMATED BLOOD LOSS:  Less than 20 ml.  Ruth Tran  04/17/2017  9:48 AM

## 2017-04-17 NOTE — Discharge Instructions (Signed)

## 2017-04-17 NOTE — H&P (Signed)
The patient's history has been reviewed, patient examined, no change in status, stable for surgery.  Questions were answered to the patients satisfaction.  

## 2017-04-17 NOTE — Transfer of Care (Signed)
Immediate Anesthesia Transfer of Care Note  Patient: Ruth Tran  Procedure(s) Performed: Procedure(s): TONSILLECTOMY AND POSSIBLE  ADENOIDECTOMY (N/A)  Patient Location: PACU  Anesthesia Type:General  Level of Consciousness: sedated  Airway & Oxygen Therapy: Patient Spontanous Breathing and Patient connected to face mask oxygen  Post-op Assessment: Report given to RN and Post -op Vital signs reviewed and stable  Post vital signs: Reviewed and stable  Last Vitals:  Vitals:   04/17/17 0738 04/17/17 0957  BP: 122/78 139/77  Pulse: 85   Resp: 16 15  Temp: (!) 36.2 C 36.7 C  SpO2: 77% 034%    Complications: No apparent anesthesia complications

## 2017-04-17 NOTE — Anesthesia Procedure Notes (Signed)
Procedure Name: Intubation Performed by: Demetrius Charity, CRNA Pre-anesthesia Checklist: Patient identified, Patient being monitored, Timeout performed, Emergency Drugs available and Suction available Patient Re-evaluated:Patient Re-evaluated prior to induction Oxygen Delivery Method: Circle system utilized Preoxygenation: Pre-oxygenation with 100% oxygen Induction Type: IV induction Ventilation: Mask ventilation without difficulty Laryngoscope Size: Mac and 3 Grade View: Grade I Tube type: Oral Rae Tube size: 7.0 mm Number of attempts: 1 Airway Equipment and Method: Stylet Placement Confirmation: ETT inserted through vocal cords under direct vision,  positive ETCO2 and breath sounds checked- equal and bilateral Tube secured with: Tape Dental Injury: Teeth and Oropharynx as per pre-operative assessment

## 2017-04-17 NOTE — Anesthesia Preprocedure Evaluation (Signed)
Anesthesia Evaluation  Patient identified by MRN, date of birth, ID band Patient awake    Reviewed: Allergy & Precautions, H&P , NPO status , Patient's Chart, lab work & pertinent test results, reviewed documented beta blocker date and time   History of Anesthesia Complications (+) history of anesthetic complications  Airway Mallampati: II  TM Distance: >3 FB Neck ROM: full    Dental  (+) Teeth Intact   Pulmonary neg pulmonary ROS,    Pulmonary exam normal        Cardiovascular Exercise Tolerance: Good negative cardio ROS Normal cardiovascular exam Rhythm:regular Rate:Normal     Neuro/Psych  Headaches, negative neurological ROS  negative psych ROS   GI/Hepatic negative GI ROS, Neg liver ROS,   Endo/Other  negative endocrine ROSHyperthyroidism   Renal/GU negative Renal ROS  negative genitourinary   Musculoskeletal   Abdominal   Peds  Hematology negative hematology ROS (+)   Anesthesia Other Findings Past Medical History: No date: Complication of anesthesia     Comment:  WITH HER WISDOM TEETH SURGERY SHE HAD TO GO TO THE OR TO              HAVE THIS DONE DUE TO TACHYCARDIA DUE TO GRAVES DISEASE No date: Complication of anesthesia No date: Dysmenorrhea No date: Family history of endometriosis     Comment:  mother,sister No date: Graves disease No date: Headache     Comment:  MIGRAINES No date: Panic attack Past Surgical History: No date: APPENDECTOMY     Comment:  WITH DRAIN PLACED DUE TO ABSCESS 2017: BREAST BIOPSY; Right     Comment:  had a reaction to xylo or xylo with epi where her throat              closed up 2008: FOOT SURGERY; Bilateral No date: WISDOM TOOTH EXTRACTION BMI    Body Mass Index:  30.45 kg/m     Reproductive/Obstetrics negative OB ROS                             Anesthesia Physical Anesthesia Plan  ASA: II  Anesthesia Plan: General ETT   Post-op  Pain Management:    Induction:   PONV Risk Score and Plan:   Airway Management Planned:   Additional Equipment:   Intra-op Plan:   Post-operative Plan:   Informed Consent: I have reviewed the patients History and Physical, chart, labs and discussed the procedure including the risks, benefits and alternatives for the proposed anesthesia with the patient or authorized representative who has indicated his/her understanding and acceptance.   Dental Advisory Given  Plan Discussed with: CRNA  Anesthesia Plan Comments:         Anesthesia Quick Evaluation

## 2017-04-18 LAB — SURGICAL PATHOLOGY

## 2017-04-18 NOTE — Anesthesia Postprocedure Evaluation (Signed)
Anesthesia Post Note  Patient: Ruth Tran  Procedure(s) Performed: TONSILLECTOMY AND POSSIBLE  ADENOIDECTOMY (N/A )  Patient location during evaluation: PACU Anesthesia Type: General Level of consciousness: awake and alert Pain management: pain level controlled Vital Signs Assessment: post-procedure vital signs reviewed and stable Respiratory status: spontaneous breathing, nonlabored ventilation, respiratory function stable and patient connected to nasal cannula oxygen Cardiovascular status: blood pressure returned to baseline and stable Postop Assessment: no apparent nausea or vomiting Anesthetic complications: no     Last Vitals:  Vitals:   04/17/17 1043 04/17/17 1103  BP: (!) 93/55 (!) 125/59  Pulse: (!) 101 93  Resp: (!) 30 16  Temp: 37.2 C 36.7 C  SpO2: 91% 96%    Last Pain:  Vitals:   04/17/17 1103  TempSrc: Tympanic  PainSc: [place] Shaquan Missey

## 2017-04-18 NOTE — Anesthesia Postprocedure Evaluation (Signed)
Anesthesia Post Note  Patient: Ruth Tran  Procedure(s) Performed: TONSILLECTOMY AND POSSIBLE  ADENOIDECTOMY (N/A )  Patient location during evaluation: PACU Anesthesia Type: General Level of consciousness: awake and alert Pain management: pain level controlled Vital Signs Assessment: post-procedure vital signs reviewed and stable Respiratory status: spontaneous breathing, nonlabored ventilation, respiratory function stable and patient connected to nasal cannula oxygen Cardiovascular status: blood pressure returned to baseline and stable Postop Assessment: no apparent nausea or vomiting Anesthetic complications: no     Last Vitals:  Vitals:   04/17/17 1043 04/17/17 1103  BP: (!) 93/55 (!) 125/59  Pulse: (!) 101 93  Resp: (!) 30 16  Temp: 37.2 C 36.7 C  SpO2: 91% 96%    Last Pain:  Vitals:   04/17/17 1103  TempSrc: Tympanic  PainSc: [name]

## 2017-06-05 ENCOUNTER — Ambulatory Visit: Payer: Self-pay

## 2017-08-16 ENCOUNTER — Encounter: Payer: Self-pay | Admitting: *Deleted

## 2017-08-16 ENCOUNTER — Other Ambulatory Visit: Payer: Self-pay

## 2017-08-16 ENCOUNTER — Ambulatory Visit
Admission: EM | Admit: 2017-08-16 | Discharge: 2017-08-16 | Disposition: A | Discharge status: Home / Self Care | Payer: Managed Care, Other (non HMO) | Attending: Family Medicine | Admitting: Family Medicine

## 2017-08-16 DIAGNOSIS — Z91038 Other insect allergy status: Secondary | ICD-10-CM | POA: Diagnosis not present

## 2017-08-16 DIAGNOSIS — J302 Other seasonal allergic rhinitis: Secondary | ICD-10-CM | POA: Diagnosis not present

## 2017-08-16 LAB — RAPID STREP SCREEN (MED CTR MEBANE ONLY): Streptococcus, Group A Screen (Direct): NEGATIVE

## 2017-08-16 MED ORDER — FLUTICASONE PROPIONATE 50 MCG/ACT NA SUSP: 2.0000 | Freq: Every day | NASAL | 0 refills | Status: DC

## 2017-08-16 MED ORDER — MUPIROCIN 2 % EX OINT: 1.0000 "application " | TOPICAL_OINTMENT | Freq: Three times a day (TID) | CUTANEOUS | 0 refills | Status: DC

## 2017-08-16 MED ORDER — TRIAMCINOLONE ACETONIDE 0.1 % EX CREA: 1.0000 "application " | TOPICAL_CREAM | Freq: Two times a day (BID) | CUTANEOUS | 0 refills | Status: DC

## 2017-08-16 NOTE — ED Provider Notes (Signed)
[facility]    CSN: [id number] Arrival date & time: 08/16/17  1155     History   Chief Complaint Chief Complaint  Patient presents with  . Nasal Congestion  . Sore Throat    HPI Ruth Tran is a 27 y.o. female.   HPI  27 year old female presents with a 3-day history of nasal congestion and nasal drainage.  She has had a sore throat that started about 2 days ago.  Has had no fever or chills.  Been using DayQuil and NyQuil without any success.  Has had green discharge from her nose.  Second problem is that of red circular blanchable rash on her arms and one on her neck.  She also has 1 or 2 on her legs.  Does not involve her trunk.  They are itchy.  A friend of hers who had been" [place]udd[place]" had very similar rash as well that responded to triamcinolone cream        Past Medical History:  Diagnosis Date  . Complication of anesthesia    WITH HER WISDOM TEETH SURGERY SHE HAD TO GO TO THE OR TO HAVE THIS DONE DUE TO TACHYCARDIA DUE TO GRAVES DISEASE  . Complication of anesthesia   . Dysmenorrhea   . Family history of endometriosis    mother,sister  . Graves disease   . Headache    MIGRAINES  . Panic attack     Patient Active Problem List   Diagnosis Date Noted  . Graves disease 01/22/2014  . Unspecified vitamin D deficiency 01/22/2014    Past Surgical History:  Procedure Laterality Date  . APPENDECTOMY     WITH DRAIN PLACED DUE TO ABSCESS  . BREAST BIOPSY Right 2017   had a reaction to xylo or xylo with epi where her throat closed up  . FOOT SURGERY Bilateral 2008  . TONSILLECTOMY AND ADENOIDECTOMY N/A 04/17/2017   Procedure: TONSILLECTOMY AND POSSIBLE  ADENOIDECTOMY;  Surgeon: Beverly Gust, MD;  Location: [facility] ORS;  Service: ENT;  Laterality: N/A;  . WISDOM TOOTH EXTRACTION      OB History    Gravida  0   Para  0   Term  0   Preterm  0   AB  0   Living  0     SAB  0   TAB  0   Ectopic  0   Multiple  0   Live Births              Home Medications    Prior to Admission medications   Medication Sig Start Date End Date Taking? Authorizing Provider  fluticasone (FLONASE) 50 MCG/ACT nasal spray Place 2 sprays into both nostrils daily. 08/16/17   Lorin Picket, PA-C  mupirocin ointment (BACTROBAN) 2 % Apply 1 application topically 3 (three) times daily. 08/16/17   Lorin Picket, PA-C  triamcinolone cream (KENALOG) 0.1 % Apply 1 application topically 2 (two) times daily. 08/16/17   Lorin Picket, PA-C    Family History Family History  Problem Relation Age of Onset  . Graves' disease Other   . Diabetes Father   . Graves' disease Maternal Grandfather   . Ovarian cancer Paternal Grandmother   . Breast cancer Neg Hx   . Colon cancer Neg Hx   . Heart disease Neg Hx     Social History Social History   Tobacco Use  . Smoking status: Never Smoker  . Smokeless tobacco: Never Used  Substance Use Topics  .  Alcohol use: No    Alcohol/week: 0.0 oz  . Drug use: No     Allergies   Lidocaine; Lidocaine-epinephrine; Latex; and Tape   Review of Systems Review of Systems  Constitutional: Positive for activity change. Negative for chills, fatigue and fever.  HENT: Positive for congestion, rhinorrhea, sinus pressure, sinus pain and sore throat.   Respiratory: Negative for cough.   All other systems reviewed and are negative.    Physical Exam Triage Vital Signs ED Triage Vitals  Enc Vitals Group     BP 08/16/17 1211 107/60     Pulse Rate 08/16/17 1211 77     Resp 08/16/17 1211 16     Temp 08/16/17 1211 98.6 F (37 C)     Temp Source 08/16/17 1211 Oral     SpO2 08/16/17 1211 100 %     Weight 08/16/17 1214 175 lb (79.4 kg)     Height 08/16/17 1214 5\' 5"  (1.651 m)     Head Circumference --      Peak Flow --      Pain Score 08/16/17 1214 0     Pain Loc --      Pain Edu? --      Excl. in [place]? --    No data found.  Updated Vital Signs BP 107/60 (BP Location: Right Arm)   Pulse 77    Temp 98.6 F (37 C) (Oral)   Resp 16   Ht 5\' 5"  (1.651 m)   Wt 175 lb (79.4 kg)   LMP 08/05/2017   SpO2 100%   BMI 29.12 kg/m   Visual Acuity Right Eye Distance:   Left Eye Distance:   Bilateral Distance:    Right Eye Near:   Left Eye Near:    Bilateral Near:     Physical Exam  Constitutional: She is oriented to person, place, and time. She appears well-developed and well-nourished.  Non-toxic appearance. She does not appear ill. No distress.  HENT:  Head: Normocephalic.  Right Ear: Tympanic membrane and ear canal normal.  Left Ear: Tympanic membrane and ear canal normal.  Mouth/Throat: Uvula is midline, oropharynx is clear and moist and mucous membranes are normal. No oral lesions. No uvula swelling. No oropharyngeal exudate, posterior oropharyngeal edema, posterior oropharyngeal erythema or tonsillar abscesses.  Eyes: Pupils are equal, round, and reactive to light. EOM are normal.  Neck: Normal range of motion.  Neurological: She is alert and oriented to person, place, and time.  Skin: Skin is warm and dry. Rash noted.  Has scattered isolated round approximately 1 cm in diameter, not raised, blanchable rash on her arms primarily, but one on her neck and 2 on her right lower leg.  Psychiatric: She has a normal mood and affect. Her behavior is normal.  Nursing note and vitals reviewed.    UC Treatments / Results  Labs (all labs ordered are listed, but only abnormal results are displayed) Labs Reviewed  RAPID STREP SCREEN (NOT AT [facility])  CULTURE, GROUP A STREP [facility])    EKG  EKG Interpretation None       Radiology No results found.  Procedures Procedures (including critical care time)  Medications Ordered in UC Medications - No data to display   Initial Impression / Assessment and Plan / UC Course  I have reviewed the triage vital signs and the nursing notes.  Pertinent labs & imaging results that were available during my care of the patient were  reviewed by me and considered in my  medical decision making (see chart for details).     Plan: 1. Test/x-ray results and diagnosis reviewed with patient 2. rx as per orders; risks, benefits, potential side effects reviewed with patient 3. Recommend supportive treatment with use of triamcinolone cream on the rash areas.  Twice daily particularly after a bath.  Appear as insect bites.  If not improving after a week or so she should seek dermatology input.  Told her that it is likely she has seasonal allergies causing her congestion in her nasal passages.  Will recommend Flonase nasal spray and Zyrtec to be used all during the spring time since pollen counts are climbing at the present time.  If she does not improve on the conservative treatment she should follow-up with an ear nose and throat specialist. 4. F/u prn if symptoms worsen or don't improve   Final Clinical Impressions(s) / UC Diagnoses   Final diagnoses:  Seasonal allergies  Allergic to insect bites    ED Discharge Orders        Ordered    triamcinolone cream (KENALOG) 0.1 %  2 times daily     08/16/17 1303    mupirocin ointment (BACTROBAN) 2 %  3 times daily     08/16/17 1303    fluticasone (FLONASE) 50 MCG/ACT nasal spray  Daily     08/16/17 1303       Controlled Substance Prescriptions [place] Controlled Substance Registry consulted? Not Applicable   Lorin Picket, PA-C 08/16/17 1342

## 2017-08-16 NOTE — ED Triage Notes (Signed)
Patient started having symptoms of nasal congestion, nasal drainage 3 days ago. Sore throat started 2 days ago. Patient also has what appears to be insect bites on her arms and neck.

## 2017-08-19 LAB — CULTURE, GROUP A STREP (THRC)

## 2017-09-19 ENCOUNTER — Encounter: Payer: Self-pay | Admitting: Internal Medicine

## 2017-09-19 ENCOUNTER — Ambulatory Visit: Payer: Managed Care, Other (non HMO) | Admitting: Internal Medicine

## 2017-09-19 VITALS — BP 100/60 | HR 62 | Temp 98.2°F | Ht 65.0 in | Wt 179.8 lb

## 2017-09-19 DIAGNOSIS — Z1159 Encounter for screening for other viral diseases: Secondary | ICD-10-CM

## 2017-09-19 DIAGNOSIS — J302 Other seasonal allergic rhinitis: Secondary | ICD-10-CM | POA: Insufficient documentation

## 2017-09-19 DIAGNOSIS — Z13220 Encounter for screening for lipoid disorders: Secondary | ICD-10-CM

## 2017-09-19 DIAGNOSIS — E0500 Thyrotoxicosis with diffuse goiter without thyrotoxic crisis or storm: Secondary | ICD-10-CM | POA: Diagnosis not present

## 2017-09-19 DIAGNOSIS — E0590 Thyrotoxicosis, unspecified without thyrotoxic crisis or storm: Secondary | ICD-10-CM | POA: Diagnosis not present

## 2017-09-19 DIAGNOSIS — Z0184 Encounter for antibody response examination: Secondary | ICD-10-CM | POA: Diagnosis not present

## 2017-09-19 DIAGNOSIS — E162 Hypoglycemia, unspecified: Secondary | ICD-10-CM

## 2017-09-19 DIAGNOSIS — Z0000 Encounter for general adult medical examination without abnormal findings: Secondary | ICD-10-CM | POA: Diagnosis not present

## 2017-09-19 DIAGNOSIS — G43009 Migraine without aura, not intractable, without status migrainosus: Secondary | ICD-10-CM | POA: Diagnosis not present

## 2017-09-19 DIAGNOSIS — E559 Vitamin D deficiency, unspecified: Secondary | ICD-10-CM

## 2017-09-19 DIAGNOSIS — Z1389 Encounter for screening for other disorder: Secondary | ICD-10-CM

## 2017-09-19 MED ORDER — MONTELUKAST SODIUM 10 MG PO TABS: 10.0000 mg | ORAL_TABLET | Freq: Every day | ORAL | 0 refills | Status: DC

## 2017-09-19 MED ORDER — NORTRIPTYLINE HCL 10 MG PO CAPS: 10.0000 mg | ORAL_CAPSULE | Freq: Every day | ORAL | 1 refills | Status: DC

## 2017-09-19 NOTE — Progress Notes (Signed)
Chief Complaint  Patient presents with  . New Patient (Initial Visit)   New patient  1. Migraines uncontrolled with Imitrex 100 mg taking at times 2x per day. She is getting adequate sleep, not doing excess caffeine. She wants to try medication for h/a. She does get nausea with h/a but does not want to try medication for nausea.  She is at times bothered by light with h/a but does not have aura  2. H/o Graves/hyperthyroidism she has not been on meds since 2015 due to labs normalizing and not f/u Endocrine since 2015 due to prior endocrine MD moving away she is agreeable to f/u endocrine but prefers to see woman will refer to Dr. Gabriel Carina  3. C/o allergies never been allergy tested on Zyrtec. She has cough today nonproductive no postnasal drip has tried Delsym  4. She does report h/o low blood sugar though she is not skipping meals    Review of Systems  Constitutional: Negative for weight loss.  HENT: Negative for hearing loss.   Eyes: Negative for blurred vision.  Respiratory: Positive for cough. Negative for sputum production.   Cardiovascular: Negative for chest pain.  Gastrointestinal: Negative for abdominal pain.  Musculoskeletal: Negative for falls.  Skin: Negative for rash.  Neurological: Positive for headaches.  Psychiatric/Behavioral: Negative for depression.   Past Medical History:  Diagnosis Date  . Allergy   . Chicken pox   . Complication of anesthesia    WITH HER WISDOM TEETH SURGERY SHE HAD TO GO TO THE OR TO HAVE THIS DONE DUE TO TACHYCARDIA DUE TO GRAVES DISEASE  . Complication of anesthesia   . Dysmenorrhea   . Family history of endometriosis    mother,sister  . Graves disease    graves, hyperthyroidism   . Headache    MIGRAINES  . Migraine   . Panic attack   . UTI (urinary tract infection)    Past Surgical History:  Procedure Laterality Date  . APPENDECTOMY  2005   WITH DRAIN PLACED DUE TO ABSCESS 2005 [facility]  . BREAST BIOPSY Right 2017   had a reaction to  xylo or xylo with epi where her throat closed up  . FOOT SURGERY Bilateral 2008   toes going outward surgery to turn toes inward 2010   . TONSILLECTOMY AND ADENOIDECTOMY N/A 04/17/2017   Procedure: TONSILLECTOMY AND POSSIBLE  ADENOIDECTOMY;  Surgeon: Beverly Gust, MD;  Location: [facility] ORS;  Service: ENT;  Laterality: N/A;  . WISDOM TOOTH EXTRACTION     2016   Family History  Problem Relation Age of Onset  . Graves' disease Other   . Diabetes Father   . Hyperlipidemia Father   . Graves' disease Maternal Grandfather   . Miscarriages / Stillbirths Maternal Grandfather   . Cancer Maternal Grandfather        prostate/bladder   . Ovarian cancer Paternal Grandmother   . Cancer Paternal Grandmother        brain  . Hypertension Mother   . Learning disabilities Sister   . Cancer Maternal Grandmother        lung (small cell) >liver>pancreas smoker   . Cancer Paternal Grandfather        skin cancer   . Breast cancer Neg Hx   . Colon cancer Neg Hx   . Heart disease Neg Hx    Social History   Socioeconomic History  . Marital status: Single    Spouse name: Not on file  . Number of children: Not on file  .  Years of education: Not on file  . Highest education level: Not on file  Occupational History  . Not on file  Social Needs  . Financial resource strain: Not on file  . Food insecurity:    Worry: Not on file    Inability: Not on file  . Transportation needs:    Medical: Not on file    Non-medical: Not on file  Tobacco Use  . Smoking status: Never Smoker  . Smokeless tobacco: Never Used  Substance and Sexual Activity  . Alcohol use: No    Alcohol/week: 0.0 oz  . Drug use: No  . Sexual activity: Never  Lifestyle  . Physical activity:    Days per week: Not on file    Minutes per session: Not on file  . Stress: Not on file  Relationships  . Social connections:    Talks on phone: Not on file    Gets together: Not on file    Attends religious service: Not on file     Active member of club or organization: Not on file    Attends meetings of clubs or organizations: Not on file    Relationship status: Not on file  . Intimate partner violence:    Fear of current or ex partner: Not on file    Emotionally abused: Not on file    Physically abused: Not on file    Forced sexual activity: Not on file  Other Topics Concern  . Not on file  Social History Narrative   Works [employer]    BA Degree    Wears seat belt,    Guns at home    Safe in relationship    Does not exercise    Current Meds  Medication Sig  . cetirizine (ZYRTEC) 10 MG tablet Take 10 mg by mouth daily.  . SUMAtriptan (IMITREX) 100 MG tablet Take 100 mg by mouth every 2 (two) hours as needed for migraine. May repeat in 2 hours if headache persists or recurs.   Allergies  Allergen Reactions  . Lidocaine Anaphylaxis  . Lidocaine-Epinephrine Anaphylaxis  . Latex Rash    rash  . Tape Rash   Recent Results (from the past 2160 hour(s))  Rapid strep screen     Status: None   Collection Time: 08/16/17 12:43 PM  Result Value Ref Range   Streptococcus, Group A Screen (Direct) NEGATIVE NEGATIVE    Comment: (NOTE) A Rapid Antigen test may result negative if the antigen level in the sample is below the detection level of this test. The FDA has not cleared this test as a stand-alone test therefore the rapid antigen negative result has reflexed to a Group A Strep culture. Performed at [facility], [address], [place], [place] [address]   Culture, group A strep     Status: None   Collection Time: 08/16/17 12:43 PM  Result Value Ref Range   Specimen Description      THROAT Performed at [facility], [address], [place], [place] [address]    Special Requests      NONE Reflexed from [id number] Performed at [facility], [address], [place], [place] [address]    Culture      NO GROUP A STREP (S.PYOGENES) ISOLATED Performed at [facility], [place] [address], [place], [place] [address]    Report Status 08/19/2017 FINAL    Objective  Body mass index is 29.92 kg/m. Wt Readings  from Last 3 Encounters:  09/19/17 179 lb 12.8 oz (81.6 kg)  08/16/17 175 lb (79.4 kg)  04/17/17 183 lb (83 kg)   Temp Readings from Last 3 Encounters:  09/19/17 98.2 F (36.8 C) (Oral)  08/16/17 98.6 F (37 C) (Oral)  04/17/17 98 F (36.7 C) (Tympanic)   BP Readings from Last 3 Encounters:  09/19/17 100/60  08/16/17 107/60  04/17/17 (!) 125/59   Pulse Readings from Last 3 Encounters:  09/19/17 62  08/16/17 77  04/17/17 93    Physical Exam  Constitutional: She is oriented to person, place, and time. Vital signs are normal. She appears well-developed and well-nourished. She is cooperative.  HENT:  Head: Normocephalic and atraumatic.  Mouth/Throat: Oropharynx is clear and moist and mucous membranes are normal.  Eyes: Pupils are equal, round, and reactive to light. Conjunctivae are normal.  Neck: No thyromegaly present.  Cardiovascular: Normal rate, regular rhythm and normal heart sounds.  Pulmonary/Chest: Effort normal and breath sounds normal.  Neurological: She is alert and oriented to person, place, and time. Gait normal.  Skin: Skin is warm, dry and intact.  Psychiatric: She has a normal mood and affect. Her speech is normal and behavior is normal. Judgment and thought content normal. Cognition and memory are normal.  Nursing note and vitals reviewed.   Assessment   1. Migraines w/o aura  2. H/o graves/hyperthyroidism also h/o hypoglycemia 3. Allergies, seasonal with cough  4. HM Plan  1.  Add nortriptyline 10 mg qhs to imitrex 100 bid prn no more than 2x per week  Consider H/a and wellness center in future  Does not want nausea medication F/u in 4 weeks   2. Check labs Refer to Endocrine Dr. Gabriel Carina pt prefers woman  3.  Cont zyrtec, add singulair  If cough continues consider referral to allergist  4.  Never had  flu shot  ? Date of Tdap and HPV  Check labs CMET, CBC, lipid, A1C, vit D, TSH, T3, T4, UA, hep B status  STD check 03/2017 [facility] neg f/u with Dr. Leafy Ro  Will get pap from 2015 from Dr. Enzo Bi release signed today   Dentist Renato Battles   Provider: Dr. [name] McLean-Scocuzza-Internal Medicine

## 2017-09-19 NOTE — Progress Notes (Signed)
Pre visit review using our clinic review tool, if applicable. No additional management support is needed unless otherwise documented below in the visit note. 

## 2017-09-19 NOTE — Patient Instructions (Signed)
Dr. Gabriel Carina Endocrine  Also mention about low blood sugars  F/u in 1 month  Labs asap fasting     Cough, Adult Coughing is a reflex that clears your throat and your airways. Coughing helps to heal and protect your lungs. It is normal to cough occasionally, but a cough that happens with other symptoms or lasts a long time may be a sign of a condition that needs treatment. A cough may last only 2-3 weeks (acute), or it may last longer than 8 weeks (chronic). What are the causes? Coughing is commonly caused by:  Breathing in substances that irritate your lungs.  A viral or bacterial respiratory infection.  Allergies.  Asthma.  Postnasal drip.  Smoking.  Acid backing up from the stomach into the esophagus (gastroesophageal reflux).  Certain medicines.  Chronic lung problems, including COPD (or rarely, lung cancer).  Other medical conditions such as heart failure.  Follow these instructions at home: Pay attention to any changes in your symptoms. Take these actions to help with your discomfort:  Take medicines only as told by your health care provider. ? If you were prescribed an antibiotic medicine, take it as told by your health care provider. Do not stop taking the antibiotic even if you start to feel better. ? Talk with your health care provider before you take a cough suppressant medicine.  Drink enough fluid to keep your urine clear or pale yellow.  If the air is dry, use a cold steam vaporizer or humidifier in your bedroom or your home to help loosen secretions.  Avoid anything that causes you to cough at work or at home.  If your cough is worse at night, try sleeping in a semi-upright position.  Avoid cigarette smoke. If you smoke, quit smoking. If you need help quitting, ask your health care provider.  Avoid caffeine.  Avoid alcohol.  Rest as needed.  Contact a health care provider if:  You have new symptoms.  You cough up pus.  Your cough does not get  better after 2-3 weeks, or your cough gets worse.  You cannot control your cough with suppressant medicines and you are losing sleep.  You develop pain that is getting worse or pain that is not controlled with pain medicines.  You have a fever.  You have unexplained weight loss.  You have night sweats. Get help right away if:  You cough up blood.  You have difficulty breathing.  Your heartbeat is very fast. This information is not intended to replace advice given to you by your health care provider. Make sure you discuss any questions you have with your health care provider. Document Released: 11/11/2010 Document Revised: 10/21/2015 Document Reviewed: 07/22/2014 Elsevier Interactive Patient Education  [employer].

## 2017-10-16 ENCOUNTER — Other Ambulatory Visit: Payer: Self-pay

## 2017-10-16 DIAGNOSIS — Z1159 Encounter for screening for other viral diseases: Secondary | ICD-10-CM

## 2017-10-16 DIAGNOSIS — Z0000 Encounter for general adult medical examination without abnormal findings: Secondary | ICD-10-CM

## 2017-10-16 DIAGNOSIS — Z0184 Encounter for antibody response examination: Secondary | ICD-10-CM

## 2017-10-16 DIAGNOSIS — E559 Vitamin D deficiency, unspecified: Secondary | ICD-10-CM

## 2017-10-16 DIAGNOSIS — E0500 Thyrotoxicosis with diffuse goiter without thyrotoxic crisis or storm: Secondary | ICD-10-CM

## 2017-10-16 DIAGNOSIS — E162 Hypoglycemia, unspecified: Secondary | ICD-10-CM

## 2017-10-16 DIAGNOSIS — Z13220 Encounter for screening for lipoid disorders: Secondary | ICD-10-CM

## 2017-10-16 DIAGNOSIS — Z1389 Encounter for screening for other disorder: Secondary | ICD-10-CM

## 2017-10-17 ENCOUNTER — Other Ambulatory Visit: Payer: Self-pay | Admitting: Internal Medicine

## 2017-10-17 DIAGNOSIS — E559 Vitamin D deficiency, unspecified: Secondary | ICD-10-CM

## 2017-10-17 LAB — CBC WITH DIFFERENTIAL/PLATELET
BASOS: 0 %
Basophils Absolute: 0 10*3/uL (ref 0.0–0.2)
EOS (ABSOLUTE): 0 10*3/uL (ref 0.0–0.4)
Eos: 1 %
HEMATOCRIT: 40.7 % (ref 34.0–46.6)
Hemoglobin: 13.1 g/dL (ref 11.1–15.9)
Immature Grans (Abs): 0 10*3/uL (ref 0.0–0.1)
Immature Granulocytes: 0 %
LYMPHS ABS: 1.9 10*3/uL (ref 0.7–3.1)
Lymphs: 43 %
MCH: 28.4 pg (ref 26.6–33.0)
MCHC: 32.2 g/dL (ref 31.5–35.7)
MCV: 88 fL (ref 79–97)
MONOS ABS: 0.3 10*3/uL (ref 0.1–0.9)
Monocytes: 7 %
Neutrophils Absolute: 2.2 10*3/uL (ref 1.4–7.0)
Neutrophils: 49 %
Platelets: 167 10*3/uL (ref 150–450)
RBC: 4.62 x10E6/uL (ref 3.77–5.28)
RDW: 13.6 % (ref 12.3–15.4)
WBC: 4.4 10*3/uL (ref 3.4–10.8)

## 2017-10-17 LAB — COMPREHENSIVE METABOLIC PANEL
A/G RATIO: 1.8 (ref 1.2–2.2)
ALBUMIN: 4.2 g/dL (ref 3.5–5.5)
ALK PHOS: 103 IU/L (ref 39–117)
ALT: 19 IU/L (ref 0–32)
AST: 32 IU/L (ref 0–40)
BUN / CREAT RATIO: 18 (ref 9–23)
BUN: 10 mg/dL (ref 6–20)
Bilirubin Total: 0.9 mg/dL (ref 0.0–1.2)
CO2: 25 mmol/L (ref 20–29)
Calcium: 9.3 mg/dL (ref 8.7–10.2)
Chloride: 106 mmol/L (ref 96–106)
Creatinine, Ser: 0.57 mg/dL (ref 0.57–1.00)
GFR calc Af Amer: 150 mL/min/{1.73_m2} (ref >59)
GFR, EST NON AFRICAN AMERICAN: 130 mL/min/{1.73_m2} (ref >59)
GLOBULIN, TOTAL: 2.3 g/dL (ref 1.5–4.5)
Glucose: 78 mg/dL (ref 65–99)
POTASSIUM: 4.8 mmol/L (ref 3.5–5.2)
SODIUM: 144 mmol/L (ref 134–144)
Total Protein: 6.5 g/dL (ref 6.0–8.5)

## 2017-10-17 LAB — MICROSCOPIC EXAMINATION: Casts: NONE SEEN /lpf

## 2017-10-17 LAB — URINALYSIS, ROUTINE W REFLEX MICROSCOPIC
BILIRUBIN UA: NEGATIVE
Glucose, UA: NEGATIVE
KETONES UA: NEGATIVE
Nitrite, UA: NEGATIVE
PH UA: 5.5 (ref 5.0–7.5)
PROTEIN UA: NEGATIVE
RBC UA: NEGATIVE
SPEC GRAV UA: 1.025 (ref 1.005–1.030)
Urobilinogen, Ur: 0.2 mg/dL (ref 0.2–1.0)

## 2017-10-17 LAB — LIPID PANEL
CHOL/HDL RATIO: 3 ratio (ref 0.0–4.4)
Cholesterol, Total: 187 mg/dL (ref 100–199)
HDL: 62 mg/dL (ref >39)
LDL Calculated: 108 mg/dL — ABNORMAL HIGH (ref 0–99)
Triglycerides: 83 mg/dL (ref 0–149)
VLDL Cholesterol Cal: 17 mg/dL (ref 5–40)

## 2017-10-17 LAB — HEPATITIS B SURFACE ANTIBODY, QUANTITATIVE

## 2017-10-17 LAB — HGB A1C W/O EAG: Hgb A1c MFr Bld: 5.1 % (ref 4.8–5.6)

## 2017-10-17 LAB — TSH+FREE T4
FREE T4: 1.09 ng/dL (ref 0.82–1.77)
TSH: 1.95 u[IU]/mL (ref 0.450–4.500)

## 2017-10-17 LAB — VITAMIN D 25 HYDROXY (VIT D DEFICIENCY, FRACTURES): Vit D, 25-Hydroxy: 20.7 ng/mL — ABNORMAL LOW (ref 30.0–100.0)

## 2017-10-17 LAB — T3, FREE: T3, Free: 2.9 pg/mL (ref 2.0–4.4)

## 2017-10-17 MED ORDER — CHOLECALCIFEROL 1.25 MG (50000 UT) PO CAPS: 50000.0000 [IU] | ORAL_CAPSULE | ORAL | 1 refills | Status: DC

## 2017-10-18 ENCOUNTER — Ambulatory Visit: Payer: Managed Care, Other (non HMO) | Admitting: Internal Medicine

## 2017-10-18 ENCOUNTER — Encounter: Payer: Self-pay | Admitting: Internal Medicine

## 2017-10-18 DIAGNOSIS — J302 Other seasonal allergic rhinitis: Secondary | ICD-10-CM | POA: Diagnosis not present

## 2017-10-18 DIAGNOSIS — G43009 Migraine without aura, not intractable, without status migrainosus: Secondary | ICD-10-CM

## 2017-10-18 MED ORDER — NORTRIPTYLINE HCL 25 MG PO CAPS: 25.0000 mg | ORAL_CAPSULE | Freq: Every day | ORAL | 2 refills | Status: DC

## 2017-10-18 MED ORDER — MONTELUKAST SODIUM 10 MG PO TABS: 10.0000 mg | ORAL_TABLET | Freq: Every day | ORAL | 3 refills | Status: DC

## 2017-10-18 NOTE — Patient Instructions (Addendum)
Let me know about [facility]  Think about hepatitis B vaccine 0 months, 1 month and 6 months get on nurse schedule if needed  Results for Ruth Tran, Ruth Tran (MRN [id number]) as of 10/18/2017 09:52  Ref. Range 10/16/2017 11:40  Hepatitis B Surf Ab Quant Latest Ref Range: Immunity>9.9 mIU/mL <3.1 (L)     Hepatitis B Vaccine, Recombinant injection What is this medicine? HEPATITIS B VACCINE (hep uh TAHY tis B VAK seen) is a vaccine. It is used to prevent an infection with the hepatitis B virus. This medicine may be used for other purposes; ask your health care provider or pharmacist if you have questions. COMMON BRAND NAME(S): Engerix-B, Recombivax HB What should I tell my health care provider before I take this medicine? They need to know if you have any of these conditions: -fever, infection -heart disease -hepatitis B infection -immune system problems -kidney disease -an unusual or allergic reaction to vaccines, yeast, other medicines, foods, dyes, or preservatives -pregnant or trying to get pregnant -breast-feeding How should I use this medicine? This vaccine is for injection into a muscle. It is given by a health care professional. A copy of Vaccine Information Statements will be given before each vaccination. Read this sheet carefully each time. The sheet may change frequently. Talk to your pediatrician regarding the use of this medicine in children. While this drug may be prescribed for children as young as newborn for selected conditions, precautions do apply. Overdosage: If you think you have taken too much of this medicine contact a poison control center or emergency room at once. NOTE: This medicine is only for you. Do not share this medicine with others. What if I miss a dose? It is important not to miss your dose. Call your doctor or health care professional if you are unable to keep an appointment. What may interact with this medicine? -medicines that  suppress your immune function like adalimumab, anakinra, infliximab -medicines to treat cancer -steroid medicines like prednisone or cortisone This list may not describe all possible interactions. Give your health care provider a list of all the medicines, herbs, non-prescription drugs, or dietary supplements you use. Also tell them if you smoke, drink alcohol, or use illegal drugs. Some items may interact with your medicine. What should I watch for while using this medicine? See your health care provider for all shots of this vaccine as directed. You must have 3 shots of this vaccine for protection from hepatitis B infection. Tell your doctor right away if you have any serious or unusual side effects after getting this vaccine. What side effects may I notice from receiving this medicine? Side effects that you should report to your doctor or health care professional as soon as possible: -allergic reactions like skin rash, itching or hives, swelling of the face, lips, or tongue -breathing problems -confused, irritated -fast, irregular heartbeat -flu-like syndrome -numb, tingling pain -seizures -unusually weak or tired Side effects that usually do not require medical attention (report to your doctor or health care professional if they continue or are bothersome): -diarrhea -fever -headache -loss of appetite -muscle pain -nausea -pain, redness, swelling, or irritation at site where injected -tiredness This list may not describe all possible side effects. Call your doctor for medical advice about side effects. You may report side effects to FDA at 1-800-FDA-1088. Where should I keep my medicine? This drug is given in a hospital or clinic and will not be stored at home. NOTE: This sheet is a summary.  It may not cover all possible information. If you have questions about this medicine, talk to your doctor, pharmacist, or health care provider.  2018 Elsevier/Gold Standard (2013-09-15  13:26:01)   Migraine Headache A migraine headache is an intense, throbbing pain on one side or both sides of the head. Migraines may also cause other symptoms, such as nausea, vomiting, and sensitivity to light and noise. What are the causes? Doing or taking certain things may also trigger migraines, such as:  Alcohol.  Smoking.  Medicines, such as: ? Medicine used to treat chest pain (nitroglycerine). ? Birth control pills. ? Estrogen pills. ? Certain blood pressure medicines.  Aged cheeses, chocolate, or caffeine.  Foods or drinks that contain nitrates, glutamate, aspartame, or tyramine.  Physical activity.  Other things that may trigger a migraine include:  Menstruation.  Pregnancy.  Hunger.  Stress, lack of sleep, too much sleep, or fatigue.  Weather changes.  What increases the risk? The following factors may make you more likely to experience migraine headaches:  Age. Risk increases with age.  Family history of migraine headaches.  Being Caucasian.  Depression and anxiety.  Obesity.  Being a woman.  Having a hole in the heart (patent foramen ovale) or other heart problems.  What are the signs or symptoms? The main symptom of this condition is pulsating or throbbing pain. Pain may:  Happen in any area of the head, such as on one side or both sides.  Interfere with daily activities.  Get worse with physical activity.  Get worse with exposure to bright lights or loud noises.  Other symptoms may include:  Nausea.  Vomiting.  Dizziness.  General sensitivity to bright lights, loud noises, or smells.  Before you get a migraine, you may get warning signs that a migraine is developing (aura). An aura may include:  Seeing flashing lights or having blind spots.  Seeing bright spots, halos, or zigzag lines.  Having tunnel vision or blurred vision.  Having numbness or a tingling feeling.  Having trouble talking.  Having muscle  weakness.  How is this diagnosed? A migraine headache can be diagnosed based on:  Your symptoms.  A physical exam.  Tests, such as CT scan or MRI of the head. These imaging tests can help rule out other causes of headaches.  Taking fluid from the spine (lumbar puncture) and analyzing it (cerebrospinal fluid analysis, or CSF analysis).  How is this treated? A migraine headache is usually treated with medicines that:  Relieve pain.  Relieve nausea.  Prevent migraines from coming back.  Treatment may also include:  Acupuncture.  Lifestyle changes like avoiding foods that trigger migraines.  Follow these instructions at home: Medicines  Take over-the-counter and prescription medicines only as told by your health care provider.  Do not drive or use heavy machinery while taking prescription pain medicine.  To prevent or treat constipation while you are taking prescription pain medicine, your health care provider may recommend that you: ? Drink enough fluid to keep your urine clear or pale yellow. ? Take over-the-counter or prescription medicines. ? Eat foods that are high in fiber, such as fresh fruits and vegetables, whole grains, and beans. ? Limit foods that are high in fat and processed sugars, such as fried and sweet foods. Lifestyle  Avoid alcohol use.  Do not use any products that contain nicotine or tobacco, such as cigarettes and e-cigarettes. If you need help quitting, ask your health care provider.  Get at least 8 hours of  sleep every night.  Limit your stress. General instructions   Keep a journal to find out what may trigger your migraine headaches. For example, write down: ? What you eat and drink. ? How much sleep you get. ? Any change to your diet or medicines.  If you have a migraine: ? Avoid things that make your symptoms worse, such as bright lights. ? It may help to lie down in a dark, quiet room. ? Do not drive or use heavy machinery. ? Ask  your health care provider what activities are safe for you while you are experiencing symptoms.  Keep all follow-up visits as told by your health care provider. This is important. Contact a health care provider if:  You develop symptoms that are different or more severe than your usual migraine symptoms. Get help right away if:  Your migraine becomes severe.  You have a fever.  You have a stiff neck.  You have vision loss.  Your muscles feel weak or like you cannot control them.  You start to lose your balance often.  You develop trouble walking.  You faint. This information is not intended to replace advice given to you by your health care provider. Make sure you discuss any questions you have with your health care provider. Document Released: 05/15/2005 Document Revised: 12/03/2015 Document Reviewed: 11/01/2015 Elsevier Interactive Patient Education  2017 [employer].

## 2017-10-18 NOTE — Progress Notes (Addendum)
Chief Complaint  Patient presents with  . Follow-up   F/u  1. Migraines had bad h/a Tuesday took imitrex x 2 pills (informed max is 2 pills in 1 day) and a times taking advil. She also reports nortriptyline is helping with h/a but she feels sleepy with medication so has to take it earlier in the night  2. Reviewed labs vit D has not picked up Rx  3. Allergy medication is helping   Review of Systems  Constitutional: Negative for weight loss.  HENT: Negative for hearing loss.   Eyes: Negative for blurred vision.  Respiratory: Negative for shortness of breath.   Cardiovascular: Negative for chest pain.  Skin: Negative for rash.  Neurological: Positive for headaches.  Psychiatric/Behavioral: Negative for depression.   Past Medical History:  Diagnosis Date  . Allergy   . Chicken pox   . Complication of anesthesia    WITH HER WISDOM TEETH SURGERY SHE HAD TO GO TO THE OR TO HAVE THIS DONE DUE TO TACHYCARDIA DUE TO GRAVES DISEASE  . Complication of anesthesia   . Dysmenorrhea   . Family history of endometriosis    mother,sister  . Graves disease    graves, hyperthyroidism   . Headache    MIGRAINES  . Migraine   . Panic attack   . UTI (urinary tract infection)    Past Surgical History:  Procedure Laterality Date  . APPENDECTOMY  2005   WITH DRAIN PLACED DUE TO ABSCESS 2005 [facility]  . BREAST BIOPSY Right 2017   had a reaction to xylo or xylo with epi where her throat closed up  . FOOT SURGERY Bilateral 2008   toes going outward surgery to turn toes inward 2010   . TONSILLECTOMY AND ADENOIDECTOMY N/A 04/17/2017   Procedure: TONSILLECTOMY AND POSSIBLE  ADENOIDECTOMY;  Surgeon: Beverly Gust, MD;  Location: [facility] ORS;  Service: ENT;  Laterality: N/A;  . WISDOM TOOTH EXTRACTION     2016   Family History  Problem Relation Age of Onset  . Graves' disease Other   . Diabetes Father   . Hyperlipidemia Father   . Graves' disease Maternal Grandfather   . Miscarriages / Stillbirths  Maternal Grandfather   . Cancer Maternal Grandfather        prostate/bladder   . Ovarian cancer Paternal Grandmother   . Cancer Paternal Grandmother        brain  . Hypertension Mother   . Learning disabilities Sister   . Cancer Maternal Grandmother        lung (small cell) >liver>pancreas smoker   . Cancer Paternal Grandfather        skin cancer   . Breast cancer Neg Hx   . Colon cancer Neg Hx   . Heart disease Neg Hx    Social History   Socioeconomic History  . Marital status: Single    Spouse name: Not on file  . Number of children: Not on file  . Years of education: Not on file  . Highest education level: Not on file  Occupational History  . Not on file  Social Needs  . Financial resource strain: Not on file  . Food insecurity:    Worry: Not on file    Inability: Not on file  . Transportation needs:    Medical: Not on file    Non-medical: Not on file  Tobacco Use  . Smoking status: Never Smoker  . Smokeless tobacco: Never Used  Substance and Sexual Activity  . Alcohol use:  No    Alcohol/week: 0.0 oz  . Drug use: No  . Sexual activity: Never  Lifestyle  . Physical activity:    Days per week: Not on file    Minutes per session: Not on file  . Stress: Not on file  Relationships  . Social connections:    Talks on phone: Not on file    Gets together: Not on file    Attends religious service: Not on file    Active member of club or organization: Not on file    Attends meetings of clubs or organizations: Not on file    Relationship status: Not on file  . Intimate partner violence:    Fear of current or ex partner: Not on file    Emotionally abused: Not on file    Physically abused: Not on file    Forced sexual activity: Not on file  Other Topics Concern  . Not on file  Social History Narrative   Works [employer]    BA Degree    Wears seat belt,    Guns at home    Safe in relationship    Does not exercise    Current Meds  Medication Sig  .  cetirizine (ZYRTEC) 10 MG tablet Take 10 mg by mouth daily.  . Cholecalciferol 50000 units capsule Take 1 capsule (50,000 Units total) by mouth once a week.  . montelukast (SINGULAIR) 10 MG tablet Take 1 tablet (10 mg total) by mouth at bedtime.  . nortriptyline (PAMELOR) 10 MG capsule Take 1 capsule (10 mg total) by mouth at bedtime.  . SUMAtriptan (IMITREX) 100 MG tablet Take 100 mg by mouth every 2 (two) hours as needed for migraine. May repeat in 2 hours if headache persists or recurs.   Allergies  Allergen Reactions  . Lidocaine Anaphylaxis  . Lidocaine-Epinephrine Anaphylaxis  . Latex Rash    rash  . Tape Rash   Recent Results (from the past 2160 hour(s))  Rapid strep screen     Status: None   Collection Time: 08/16/17 12:43 PM  Result Value Ref Range   Streptococcus, Group A Screen (Direct) NEGATIVE NEGATIVE    Comment: (NOTE) A Rapid Antigen test may result negative if the antigen level in the sample is below the detection level of this test. The FDA has not cleared this test as a stand-alone test therefore the rapid antigen negative result has reflexed to a Group A Strep culture. Performed at [facility], [address], [place], [place] [address]   Culture, group A strep     Status: None   Collection Time: 08/16/17 12:43 PM  Result Value Ref Range   Specimen Description      THROAT Performed at [facility], [address], [place], [place] [address]    Special Requests      NONE Reflexed from [id number] Performed at [facility], [address], [place], [place] [address]    Culture      NO GROUP A STREP (S.PYOGENES) ISOLATED Performed at [facility], [place] [address] [place], [place] [address]    Report Status 08/19/2017 FINAL   Comprehensive metabolic panel     Status: None   Collection Time: 10/16/17 11:40 AM  Result Value Ref Range   Glucose 78 65 - 99 mg/dL   BUN 10 6 - 20 mg/dL   Creatinine, Ser 0.57 0.57 -  1.00 mg/dL   GFR calc non Af Wyvonnia Lora  130 >59 mL/min/1.73   GFR calc Af Amer 150 >59 mL/min/1.73   BUN/Creatinine Ratio 18 9 - 23   Sodium 144 134 - 144 mmol/L   Potassium 4.8 3.5 - 5.2 mmol/L   Chloride 106 96 - 106 mmol/L   CO2 25 20 - 29 mmol/L   Calcium 9.3 8.7 - 10.2 mg/dL   Total Protein 6.5 6.0 - 8.5 g/dL   Albumin 4.2 3.5 - 5.5 g/dL   Globulin, Total 2.3 1.5 - 4.5 g/dL   Albumin/Globulin Ratio 1.8 1.2 - 2.2   Bilirubin Total 0.9 0.0 - 1.2 mg/dL   Alkaline Phosphatase 103 39 - 117 IU/L   AST 32 0 - 40 IU/L   ALT 19 0 - 32 IU/L  CBC with Differential/Platelet     Status: None   Collection Time: 10/16/17 11:40 AM  Result Value Ref Range   WBC 4.4 3.4 - 10.8 x10E3/uL   RBC 4.62 3.77 - 5.28 x10E6/uL   Hemoglobin 13.1 11.1 - 15.9 g/dL   Hematocrit 40.7 34.0 - 46.6 %   MCV 88 79 - 97 fL   MCH 28.4 26.6 - 33.0 pg   MCHC 32.2 31.5 - 35.7 g/dL   RDW 13.6 12.3 - 15.4 %   Platelets 167 150 - 450 x10E3/uL    Comment:               **Please note reference interval change**   Neutrophils 49 Not Estab. %   Lymphs 43 Not Estab. %   Monocytes 7 Not Estab. %   Eos 1 Not Estab. %   Basos 0 Not Estab. %   Neutrophils Absolute 2.2 1.4 - 7.0 x10E3/uL   Lymphocytes Absolute 1.9 0.7 - 3.1 x10E3/uL   Monocytes Absolute 0.3 0.1 - 0.9 x10E3/uL   EOS (ABSOLUTE) 0.0 0.0 - 0.4 x10E3/uL   Basophils Absolute 0.0 0.0 - 0.2 x10E3/uL   Immature Granulocytes 0 Not Estab. %   Immature Grans (Abs) 0.0 0.0 - 0.1 x10E3/uL  Lipid panel     Status: Abnormal   Collection Time: 10/16/17 11:40 AM  Result Value Ref Range   Cholesterol, Total 187 100 - 199 mg/dL   Triglycerides 83 0 - 149 mg/dL   HDL 62 >39 mg/dL   VLDL Cholesterol Cal 17 5 - 40 mg/dL   LDL Calculated 108 (H) 0 - 99 mg/dL   Chol/HDL Ratio 3.0 0.0 - 4.4 ratio    Comment:                                   T. Chol/HDL Ratio                                             Men  Women                               1/2 Avg.Risk  3.4    3.3                                    Avg.Risk  5.0    4.4  2X Avg.Risk  9.6    7.1                                3X Avg.Risk 23.4   11.0   Hemoglobin A1c     Status: None   Collection Time: 10/16/17 11:40 AM  Result Value Ref Range   Hgb A1c MFr Bld 5.1 4.8 - 5.6 %    Comment:          Prediabetes: 5.7 - 6.4          Diabetes: >6.4          Glycemic control for adults with diabetes: <7.0   VITAMIN D 25 Hydroxy (Vit-D Deficiency, Fractures)     Status: Abnormal   Collection Time: 10/16/17 11:40 AM  Result Value Ref Range   Vit D, 25-Hydroxy 20.7 (L) 30.0 - 100.0 ng/mL    Comment: Vitamin D deficiency has been defined by the [place] and an Endocrine Society practice guideline as a level of serum 25-OH vitamin D less than 20 ng/mL (1,2). The Endocrine Society went on to further define vitamin D insufficiency as a level between 21 and 29 ng/mL (2). 1. IOM (Institute of Medicine). 2010. Dietary reference    intakes for calcium and D. [place]: The    [employer]. 2. Holick MF, Binkley [place], Bischoff-Ferrari HA, et al.    Evaluation, treatment, and prevention of vitamin D    deficiency: an Endocrine Society clinical practice    guideline. JCEM. 2011 Jul; 96(7):1911-30.   TSH + free T4     Status: None   Collection Time: 10/16/17 11:40 AM  Result Value Ref Range   TSH 1.950 0.450 - 4.500 uIU/mL   Free T4 1.09 0.82 - 1.77 ng/dL  T3, free     Status: None   Collection Time: 10/16/17 11:40 AM  Result Value Ref Range   T3, Free 2.9 2.0 - 4.4 pg/mL  Hepatitis B surface antibody     Status: Abnormal   Collection Time: 10/16/17 11:40 AM  Result Value Ref Range   Hepatitis B Surf Ab Quant <3.1 (L) Immunity>9.9 mIU/mL    Comment:   Status of Immunity                     Anti-HBs Level   ------------------                     -------------- Inconsistent with Immunity                   0.0 - 9.9 Consistent with Immunity                           >9.9   Urinalysis, Routine w reflex microscopic (not at [facility])     Status: Abnormal   Collection Time: 10/16/17 11:45 AM  Result Value Ref Range   Specific Gravity, UA 1.025 1.005 - 1.030   pH, UA 5.5 5.0 - 7.5   Color, UA Yellow Yellow   Appearance Ur Clear Clear   Leukocytes, UA Trace (A) Negative   Protein, UA Negative Negative/Trace   Glucose, UA Negative Negative   Ketones, UA Negative Negative   RBC, UA Negative Negative   Bilirubin, UA Negative Negative   Urobilinogen, Ur 0.2 0.2 - 1.0 mg/dL   Nitrite, UA Negative Negative  Microscopic Examination See below:     Comment: Microscopic was indicated and was performed.  Microscopic Examination     Status: Abnormal   Collection Time: 10/16/17 11:45 AM  Result Value Ref Range   WBC, UA 0-5 0 - 5 /hpf   RBC, UA 0-2 0 - 2 /hpf   Epithelial Cells (non renal) 0-10 0 - 10 /hpf   Casts None seen None seen /lpf   Crystals Present (A) N/A   Crystal Type Calcium Oxalate N/A   Mucus, UA Present Not Estab.   Bacteria, UA Few None seen/Few   Objective  Body mass index is 30.19 kg/m. Wt Readings from Last 3 Encounters:  10/18/17 181 lb 6.4 oz (82.3 kg)  09/19/17 179 lb 12.8 oz (81.6 kg)  08/16/17 175 lb (79.4 kg)   Temp Readings from Last 3 Encounters:  10/18/17 98.3 F (36.8 C) (Oral)  09/19/17 98.2 F (36.8 C) (Oral)  08/16/17 98.6 F (37 C) (Oral)   BP Readings from Last 3 Encounters:  10/18/17 106/62  09/19/17 100/60  08/16/17 107/60   Pulse Readings from Last 3 Encounters:  10/18/17 85  09/19/17 62  08/16/17 77    Physical Exam  Constitutional: She is oriented to person, place, and time. Vital signs are normal. She appears well-developed and well-nourished. She is cooperative.  HENT:  Head: Normocephalic and atraumatic.  Mouth/Throat: Oropharynx is clear and moist and mucous membranes are normal.  Eyes: Pupils are equal, round, and reactive to light. Conjunctivae are normal.  Cardiovascular: Normal rate,  regular rhythm and normal heart sounds.  Pulmonary/Chest: Effort normal and breath sounds normal.  Neurological: She is alert and oriented to person, place, and time. Gait normal.  Skin: Skin is warm, dry and intact.  Psychiatric: She has a normal mood and affect. Her speech is normal and behavior is normal. Judgment and thought content normal. Cognition and memory are normal.  Nursing note and vitals reviewed.   Assessment   1. Migraines 2. Vit D def  3. Allergic rhinitis  4. H/o graves/hyperthyroidism  5. HM Plan  1. Inc nortriptyline 10 to 25 qhs  Prn imitrex max 2x per week and 2 pills in 2 hrs informed pt  Pt to let me know if wants referral h/a and wellness center in future  2. Has to pick up vit D 3. Cont singulair, zyrtec  4. Thyroid appt 11/05/17 thyroid labs normal  5.  Never had flu shot  ? Date of Tdap and HPV  rec hep B vaccine today pt will disc with mom  STD check 03/2017 [facility] neg f/u with Dr. Leafy Ro  Will get pap from 2015 from Dr. Enzo Bi release signed today   Dentist Renato Battles  Dr. Gabriel Carina notes reviewed 11/05/17 for Graves dx'ed 2005 tx'ed methimazole until 12/2013 TSH and T4 remained normal labs 2016, 2017, 2019 labs. Graves in remission though labs checked f/u based on labs      Provider: Dr. [name] McLean-Scocuzza-Internal Medicine

## 2017-10-18 NOTE — Progress Notes (Signed)
Pre visit review using our clinic review tool, if applicable. No additional management support is needed unless otherwise documented below in the visit note. 

## 2017-10-31 ENCOUNTER — Ambulatory Visit (INDEPENDENT_AMBULATORY_CARE_PROVIDER_SITE_OTHER): Payer: Managed Care, Other (non HMO)

## 2017-10-31 ENCOUNTER — Ambulatory Visit
Admission: EM | Admit: 2017-10-31 | Discharge: 2017-10-31 | Disposition: A | Discharge status: Home / Self Care | Payer: Managed Care, Other (non HMO) | Attending: Family Medicine | Admitting: Family Medicine

## 2017-10-31 DIAGNOSIS — S9032XA Contusion of left foot, initial encounter: Secondary | ICD-10-CM | POA: Diagnosis not present

## 2017-10-31 DIAGNOSIS — M79672 Pain in left foot: Secondary | ICD-10-CM

## 2017-10-31 DIAGNOSIS — W208XXA Other cause of strike by thrown, projected or falling object, initial encounter: Secondary | ICD-10-CM | POA: Diagnosis not present

## 2017-10-31 NOTE — ED Provider Notes (Signed)
[facility]    CSN: [id number] Arrival date & time: 10/31/17  1642     History   Chief Complaint Chief Complaint  Patient presents with  . Foot Pain    HPI Ruth Tran is a 27 y.o. female.   27 yo female with a c/o left foot pain since Friday after dropping a bag of ice on her foot. Denies any numbness/tingling. Taking 400mg  ibuprofen without relief.   The history is provided by the patient.  Foot Pain     Past Medical History:  Diagnosis Date  . Allergy   . Chicken pox   . Complication of anesthesia    WITH HER WISDOM TEETH SURGERY SHE HAD TO GO TO THE OR TO HAVE THIS DONE DUE TO TACHYCARDIA DUE TO GRAVES DISEASE  . Complication of anesthesia   . Dysmenorrhea   . Family history of endometriosis    mother,sister  . Graves disease    graves, hyperthyroidism   . Headache    MIGRAINES  . Migraine   . Panic attack   . UTI (urinary tract infection)     Patient Active Problem List   Diagnosis Date Noted  . Migraine without aura and without status migrainosus, not intractable 09/19/2017  . Hyperthyroidism 09/19/2017  . Seasonal allergies 09/19/2017  . Graves disease 01/22/2014  . Vitamin D deficiency disease 01/22/2014    Past Surgical History:  Procedure Laterality Date  . APPENDECTOMY  2005   WITH DRAIN PLACED DUE TO ABSCESS 2005 [facility]  . BREAST BIOPSY Right 2017   had a reaction to xylo or xylo with epi where her throat closed up  . FOOT SURGERY Bilateral 2008   toes going outward surgery to turn toes inward 2010   . TONSILLECTOMY AND ADENOIDECTOMY N/A 04/17/2017   Procedure: TONSILLECTOMY AND POSSIBLE  ADENOIDECTOMY;  Surgeon: Beverly Gust, MD;  Location: [facility] ORS;  Service: ENT;  Laterality: N/A;  . WISDOM TOOTH EXTRACTION     2016    OB History    Gravida  0   Para  0   Term  0   Preterm  0   AB  0   Living  0     SAB  0   TAB  0   Ectopic  0   Multiple  0   Live Births               Home Medications     Prior to Admission medications   Medication Sig Start Date End Date Taking? Authorizing Provider  cetirizine (ZYRTEC) 10 MG tablet Take 10 mg by mouth daily.   Yes [provider]  montelukast (SINGULAIR) 10 MG tablet Take 1 tablet (10 mg total) by mouth at bedtime. 10/18/17  Yes McLean-Scocuzza, [name], MD  nortriptyline (PAMELOR) 25 MG capsule Take 1 capsule (25 mg total) by mouth at bedtime. 10/18/17  Yes McLean-Scocuzza, [name], MD  SUMAtriptan (IMITREX) 100 MG tablet Take 100 mg by mouth every 2 (two) hours as needed for migraine. May repeat in 2 hours if headache persists or recurs.   Yes [provider]  Cholecalciferol 50000 units capsule Take 1 capsule (50,000 Units total) by mouth once a week. 10/17/17   McLean-Scocuzza, [name], MD    Family History Family History  Problem Relation Age of Onset  . Graves' disease Other   . Diabetes Father   . Hyperlipidemia Father   . Graves' disease Maternal Grandfather   . Miscarriages /  Stillbirths Maternal Grandfather   . Cancer Maternal Grandfather        prostate/bladder   . Ovarian cancer Paternal Grandmother   . Cancer Paternal Grandmother        brain  . Hypertension Mother   . Learning disabilities Sister   . Cancer Maternal Grandmother        lung (small cell) >liver>pancreas smoker   . Cancer Paternal Grandfather        skin cancer   . Breast cancer Neg Hx   . Colon cancer Neg Hx   . Heart disease Neg Hx     Social History Social History   Tobacco Use  . Smoking status: Never Smoker  . Smokeless tobacco: Never Used  Substance Use Topics  . Alcohol use: No    Alcohol/week: 0.0 oz  . Drug use: No     Allergies   Lidocaine; Lidocaine-epinephrine; Latex; and Tape   Review of Systems Review of Systems   Physical Exam Triage Vital Signs ED Triage Vitals  Enc Vitals Group     BP 10/31/17 1648 116/73     Pulse Rate 10/31/17 1648 95     Resp 10/31/17 1648 18     Temp 10/31/17 1648 98.1 F  (36.7 C)     Temp Source 10/31/17 1648 Oral     SpO2 10/31/17 1648 100 %     Weight --      Height --      Head Circumference --      Peak Flow --      Pain Score 10/31/17 1650 5     Pain Loc --      Pain Edu? --      Excl. in [place]? --    No data found.  Updated Vital Signs BP 116/73 (BP Location: Left Arm)   Pulse 95   Temp 98.1 F (36.7 C) (Oral)   Resp 18   LMP 10/14/2017 (Exact Date)   SpO2 100%   Visual Acuity Right Eye Distance:   Left Eye Distance:   Bilateral Distance:    Right Eye Near:   Left Eye Near:    Bilateral Near:     Physical Exam  Constitutional: She appears well-developed and well-nourished. No distress.  Musculoskeletal:       Left foot: There is tenderness and bony tenderness. There is normal range of motion, no swelling, normal capillary refill, no crepitus, no deformity and no laceration.  Skin: She is not diaphoretic.  Nursing note and vitals reviewed.    UC Treatments / Results  Labs (all labs ordered are listed, but only abnormal results are displayed) Labs Reviewed - No data to display  EKG None  Radiology Dg Foot Complete Left  Result Date: 10/31/2017 CLINICAL DATA:  Dropped something on foot with pain, initial encounter EXAM: LEFT FOOT - COMPLETE 3+ VIEW COMPARISON:  None. FINDINGS: Postsurgical changes are noted in the first proximal phalanx. Very mild soft tissue swelling is noted on the dorsal aspect of the foot distally. No acute fracture or dislocation is seen. IMPRESSION: Soft tissue swelling without acute bony abnormality. Electronically Signed   By: Inez Catalina M.D.   On: 10/31/2017 17:46    Procedures Procedures (including critical care time)  Medications Ordered in UC Medications - No data to display  Initial Impression / Assessment and Plan / UC Course  I have reviewed the triage vital signs and the nursing notes.  Pertinent labs & imaging results that were available during  my care of the patient were reviewed by  me and considered in my medical decision making (see chart for details).     Final Clinical Impressions(s) / UC Diagnoses   Final diagnoses:  Contusion of left foot, initial encounter     Discharge Instructions     Rest, ice, elevation, ibuprofen    ED Prescriptions    None     1. x-ray results and diagnosis reviewed with patient 2. Recommend supportive treatment as above; increase ibuprofen to 600mg   3. Follow-up prn if symptoms worsen or don't improve  Controlled Substance Prescriptions [place] Controlled Substance Registry consulted? Not Applicable   Norval Gable, MD 10/31/17 [phone]

## 2017-10-31 NOTE — ED Triage Notes (Signed)
Pt dropped something on her left foot Friday and was unable to rest it after the injury. Has been walking around at work since then and the pain has increased for the past 2 days. Cannot walk on it and pt is limping. States she took Advil without relief.

## 2017-10-31 NOTE — Discharge Instructions (Signed)
Rest, ice, elevation, ibuprofen

## 2017-11-01 ENCOUNTER — Ambulatory Visit: Payer: Self-pay | Admitting: Internal Medicine

## 2017-11-01 ENCOUNTER — Telehealth: Payer: Self-pay

## 2017-11-01 NOTE — Telephone Encounter (Signed)
Copied from [place] [phone]. Topic: Quick Communication - Appointment Cancellation >> Nov 01, 2017  9:25 AM Ruth Tran wrote: Patient called to cancel appointment scheduled for 11/01/17. Patient has not rescheduled their appointment. Appt no longer needed  Route to department's PEC pool.

## 2017-11-05 ENCOUNTER — Other Ambulatory Visit: Payer: Self-pay

## 2017-11-05 DIAGNOSIS — Z8639 Personal history of other endocrine, nutritional and metabolic disease: Secondary | ICD-10-CM

## 2017-11-06 LAB — TSH+FREE T4
Free T4: 1.05 ng/dL (ref 0.82–1.77)
TSH: 3.19 u[IU]/mL (ref 0.450–4.500)

## 2017-11-06 LAB — THYROTROPIN RECEPTOR AUTOABS: Thyrotropin Receptor Ab: <0.5 IU/L (ref 0.00–1.75)

## 2017-11-22 ENCOUNTER — Encounter: Payer: Self-pay | Admitting: Internal Medicine

## 2017-11-22 ENCOUNTER — Ambulatory Visit: Payer: Managed Care, Other (non HMO) | Admitting: Internal Medicine

## 2017-11-22 VITALS — BP 120/80 | HR 82 | Temp 98.0°F | Ht 65.0 in | Wt 185.6 lb

## 2017-11-22 DIAGNOSIS — G43009 Migraine without aura, not intractable, without status migrainosus: Secondary | ICD-10-CM

## 2017-11-22 MED ORDER — METHYLPREDNISOLONE ACETATE 40 MG/ML IJ SUSP
40.0000 mg | Freq: Once | INTRAMUSCULAR | Status: AC
  Administered 2017-11-22: 40 mg via INTRAMUSCULAR

## 2017-11-22 MED ORDER — KETOROLAC TROMETHAMINE 60 MG/2ML IM SOLN
60.0000 mg | Freq: Once | INTRAMUSCULAR | Status: AC
  Administered 2017-11-22: 30 mg via INTRAMUSCULAR

## 2017-11-22 NOTE — Progress Notes (Signed)
Pre visit review using our clinic review tool, if applicable. No additional management support is needed unless otherwise documented below in the visit note. 

## 2017-11-22 NOTE — Patient Instructions (Signed)
Migraine Headache A migraine headache is an intense, throbbing pain on one side or both sides of the head. Migraines may also cause other symptoms, such as nausea, vomiting, and sensitivity to light and noise. What are the causes? Doing or taking certain things may also trigger migraines, such as:  Alcohol.  Smoking.  Medicines, such as: ? Medicine used to treat chest pain (nitroglycerine). ? Birth control pills. ? Estrogen pills. ? Certain blood pressure medicines.  Aged cheeses, chocolate, or caffeine.  Foods or drinks that contain nitrates, glutamate, aspartame, or tyramine.  Physical activity.  Other things that may trigger a migraine include:  Menstruation.  Pregnancy.  Hunger.  Stress, lack of sleep, too much sleep, or fatigue.  Weather changes.  What increases the risk? The following factors may make you more likely to experience migraine headaches:  Age. Risk increases with age.  Family history of migraine headaches.  Being Caucasian.  Depression and anxiety.  Obesity.  Being a woman.  Having a hole in the heart (patent foramen ovale) or other heart problems.  What are the signs or symptoms? The main symptom of this condition is pulsating or throbbing pain. Pain may:  Happen in any area of the head, such as on one side or both sides.  Interfere with daily activities.  Get worse with physical activity.  Get worse with exposure to bright lights or loud noises.  Other symptoms may include:  Nausea.  Vomiting.  Dizziness.  General sensitivity to bright lights, loud noises, or smells.  Before you get a migraine, you may get warning signs that a migraine is developing (aura). An aura may include:  Seeing flashing lights or having blind spots.  Seeing bright spots, halos, or zigzag lines.  Having tunnel vision or blurred vision.  Having numbness or a tingling feeling.  Having trouble talking.  Having muscle weakness.  How is this  diagnosed? A migraine headache can be diagnosed based on:  Your symptoms.  A physical exam.  Tests, such as CT scan or MRI of the head. These imaging tests can help rule out other causes of headaches.  Taking fluid from the spine (lumbar puncture) and analyzing it (cerebrospinal fluid analysis, or CSF analysis).  How is this treated? A migraine headache is usually treated with medicines that:  Relieve pain.  Relieve nausea.  Prevent migraines from coming back.  Treatment may also include:  Acupuncture.  Lifestyle changes like avoiding foods that trigger migraines.  Follow these instructions at home: Medicines  Take over-the-counter and prescription medicines only as told by your health care provider.  Do not drive or use heavy machinery while taking prescription pain medicine.  To prevent or treat constipation while you are taking prescription pain medicine, your health care provider may recommend that you: ? Drink enough fluid to keep your urine clear or pale yellow. ? Take over-the-counter or prescription medicines. ? Eat foods that are high in fiber, such as fresh fruits and vegetables, whole grains, and beans. ? Limit foods that are high in fat and processed sugars, such as fried and sweet foods. Lifestyle  Avoid alcohol use.  Do not use any products that contain nicotine or tobacco, such as cigarettes and e-cigarettes. If you need help quitting, ask your health care provider.  Get at least 8 hours of sleep every night.  Limit your stress. General instructions   Keep a journal to find out what may trigger your migraine headaches. For example, write down: ? What you eat and   drink. ? How much sleep you get. ? Any change to your diet or medicines.  If you have a migraine: ? Avoid things that make your symptoms worse, such as bright lights. ? It may help to lie down in a dark, quiet room. ? Do not drive or use heavy machinery. ? Ask your health care provider  what activities are safe for you while you are experiencing symptoms.  Keep all follow-up visits as told by your health care provider. This is important. Contact a health care provider if:  You develop symptoms that are different or more severe than your usual migraine symptoms. Get help right away if:  Your migraine becomes severe.  You have a fever.  You have a stiff neck.  You have vision loss.  Your muscles feel weak or like you cannot control them.  You start to lose your balance often.  You develop trouble walking.  You faint. This information is not intended to replace advice given to you by your health care provider. Make sure you discuss any questions you have with your health care provider. Document Released: 05/15/2005 Document Revised: 12/03/2015 Document Reviewed: 11/01/2015 Elsevier Interactive Patient Education  2017 Elsevier Inc.   

## 2017-11-22 NOTE — Progress Notes (Signed)
Chief Complaint  Patient presents with  . Migraine   Migraine  Started Friday 6-8/10 now 2/10 she was at work in the sun which triggers migraines tried imitrex and 4 ibuprofen with imitrex and 2 nsaids with it on another occasion. Migraines not triggered by cycles. She is nauseated with h/as but does not want medication for nausea.    Review of Systems  Eyes: Negative for photophobia.  Gastrointestinal: Negative for nausea and vomiting.  Neurological: Positive for headaches.   Past Medical History:  Diagnosis Date  . Allergy   . Chicken pox   . Complication of anesthesia    WITH HER WISDOM TEETH SURGERY SHE HAD TO GO TO THE OR TO HAVE THIS DONE DUE TO TACHYCARDIA DUE TO GRAVES DISEASE  . Complication of anesthesia   . Dysmenorrhea   . Family history of endometriosis    mother,sister  . Graves disease    graves, hyperthyroidism   . Headache    MIGRAINES  . Migraine   . Panic attack   . UTI (urinary tract infection)    Past Surgical History:  Procedure Laterality Date  . APPENDECTOMY  2005   WITH DRAIN PLACED DUE TO ABSCESS 2005 [facility]  . BREAST BIOPSY Right 2017   had a reaction to xylo or xylo with epi where her throat closed up  . FOOT SURGERY Bilateral 2008   toes going outward surgery to turn toes inward 2010   . TONSILLECTOMY AND ADENOIDECTOMY N/A 04/17/2017   Procedure: TONSILLECTOMY AND POSSIBLE  ADENOIDECTOMY;  Surgeon: Beverly Gust, MD;  Location: [facility] ORS;  Service: ENT;  Laterality: N/A;  . WISDOM TOOTH EXTRACTION     2016   Family History  Problem Relation Age of Onset  . Graves' disease Other   . Diabetes Father   . Hyperlipidemia Father   . Graves' disease Maternal Grandfather   . Miscarriages / Stillbirths Maternal Grandfather   . Cancer Maternal Grandfather        prostate/bladder   . Ovarian cancer Paternal Grandmother   . Cancer Paternal Grandmother        brain  . Hypertension Mother   . Learning disabilities Sister   . Cancer Maternal  Grandmother        lung (small cell) >liver>pancreas smoker   . Cancer Paternal Grandfather        skin cancer   . Breast cancer Neg Hx   . Colon cancer Neg Hx   . Heart disease Neg Hx    Social History   Socioeconomic History  . Marital status: Single    Spouse name: Not on file  . Number of children: Not on file  . Years of education: Not on file  . Highest education level: Not on file  Occupational History  . Not on file  Social Needs  . Financial resource strain: Not on file  . Food insecurity:    Worry: Not on file    Inability: Not on file  . Transportation needs:    Medical: Not on file    Non-medical: Not on file  Tobacco Use  . Smoking status: Never Smoker  . Smokeless tobacco: Never Used  Substance and Sexual Activity  . Alcohol use: No    Alcohol/week: 0.0 oz  . Drug use: No  . Sexual activity: Never  Lifestyle  . Physical activity:    Days per week: Not on file    Minutes per session: Not on file  . Stress: Not on file  Relationships  . Social connections:    Talks on phone: Not on file    Gets together: Not on file    Attends religious service: Not on file    Active member of club or organization: Not on file    Attends meetings of clubs or organizations: Not on file    Relationship status: Not on file  . Intimate partner violence:    Fear of current or ex partner: Not on file    Emotionally abused: Not on file    Physically abused: Not on file    Forced sexual activity: Not on file  Other Topics Concern  . Not on file  Social History Narrative   Works [employer]    BA Degree    Wears seat belt,    Guns at home    Safe in relationship    Does not exercise    Current Meds  Medication Sig  . cetirizine (ZYRTEC) 10 MG tablet Take 10 mg by mouth daily.  . Cholecalciferol 50000 units capsule Take 1 capsule (50,000 Units total) by mouth once a week.  . montelukast (SINGULAIR) 10 MG tablet Take 1 tablet (10 mg total) by mouth at bedtime.  .  nortriptyline (PAMELOR) 25 MG capsule Take 1 capsule (25 mg total) by mouth at bedtime.  . SUMAtriptan (IMITREX) 100 MG tablet Take 100 mg by mouth every 2 (two) hours as needed for migraine. May repeat in 2 hours if headache persists or recurs.   Allergies  Allergen Reactions  . Lidocaine Anaphylaxis  . Lidocaine-Epinephrine Anaphylaxis  . Latex Rash    rash  . Tape Rash   Recent Results (from the past 2160 hour(s))  Comprehensive metabolic panel     Status: None   Collection Time: 10/16/17 11:40 AM  Result Value Ref Range   Glucose 78 65 - 99 mg/dL   BUN 10 6 - 20 mg/dL   Creatinine, Ser 0.57 0.57 - 1.00 mg/dL   GFR calc non Af Amer 130 >59 mL/min/1.73   GFR calc Af Amer 150 >59 mL/min/1.73   BUN/Creatinine Ratio 18 9 - 23   Sodium 144 134 - 144 mmol/L   Potassium 4.8 3.5 - 5.2 mmol/L   Chloride 106 96 - 106 mmol/L   CO2 25 20 - 29 mmol/L   Calcium 9.3 8.7 - 10.2 mg/dL   Total Protein 6.5 6.0 - 8.5 g/dL   Albumin 4.2 3.5 - 5.5 g/dL   Globulin, Total 2.3 1.5 - 4.5 g/dL   Albumin/Globulin Ratio 1.8 1.2 - 2.2   Bilirubin Total 0.9 0.0 - 1.2 mg/dL   Alkaline Phosphatase 103 39 - 117 IU/L   AST 32 0 - 40 IU/L   ALT 19 0 - 32 IU/L  CBC with Differential/Platelet     Status: None   Collection Time: 10/16/17 11:40 AM  Result Value Ref Range   WBC 4.4 3.4 - 10.8 x10E3/uL   RBC 4.62 3.77 - 5.28 x10E6/uL   Hemoglobin 13.1 11.1 - 15.9 g/dL   Hematocrit 40.7 34.0 - 46.6 %   MCV 88 79 - 97 fL   MCH 28.4 26.6 - 33.0 pg   MCHC 32.2 31.5 - 35.7 g/dL   RDW 13.6 12.3 - 15.4 %   Platelets 167 150 - 450 x10E3/uL    Comment:               **Please note reference interval change**   Neutrophils 49 Not Estab. %   Lymphs  43 Not Estab. %   Monocytes 7 Not Estab. %   Eos 1 Not Estab. %   Basos 0 Not Estab. %   Neutrophils Absolute 2.2 1.4 - 7.0 x10E3/uL   Lymphocytes Absolute 1.9 0.7 - 3.1 x10E3/uL   Monocytes Absolute 0.3 0.1 - 0.9 x10E3/uL   EOS (ABSOLUTE) 0.0 0.0 - 0.4 x10E3/uL    Basophils Absolute 0.0 0.0 - 0.2 x10E3/uL   Immature Granulocytes 0 Not Estab. %   Immature Grans (Abs) 0.0 0.0 - 0.1 x10E3/uL  Lipid panel     Status: Abnormal   Collection Time: 10/16/17 11:40 AM  Result Value Ref Range   Cholesterol, Total 187 100 - 199 mg/dL   Triglycerides 83 0 - 149 mg/dL   HDL 62 >39 mg/dL   VLDL Cholesterol Cal 17 5 - 40 mg/dL   LDL Calculated 108 (H) 0 - 99 mg/dL   Chol/HDL Ratio 3.0 0.0 - 4.4 ratio    Comment:                                   T. Chol/HDL Ratio                                             Men  Women                               1/2 Avg.Risk  3.4    3.3                                   Avg.Risk  5.0    4.4                                2X Avg.Risk  9.6    7.1                                3X Avg.Risk 23.4   11.0   Hemoglobin A1c     Status: None   Collection Time: 10/16/17 11:40 AM  Result Value Ref Range   Hgb A1c MFr Bld 5.1 4.8 - 5.6 %    Comment:          Prediabetes: 5.7 - 6.4          Diabetes: >6.4          Glycemic control for adults with diabetes: <7.0   VITAMIN D 25 Hydroxy (Vit-D Deficiency, Fractures)     Status: Abnormal   Collection Time: 10/16/17 11:40 AM  Result Value Ref Range   Vit D, 25-Hydroxy 20.7 (L) 30.0 - 100.0 ng/mL    Comment: Vitamin D deficiency has been defined by the [place] and an Endocrine Society practice guideline as a level of serum 25-OH vitamin D less than 20 ng/mL (1,2). The Endocrine Society went on to further define vitamin D insufficiency as a level between 21 and 29 ng/mL (2). 1. IOM (Institute of Medicine). 2010. Dietary reference    intakes for calcium and D. [place]: The    [employer]. 2.  Holick MF, Binkley [place], Bischoff-Ferrari HA, et al.    Evaluation, treatment, and prevention of vitamin D    deficiency: an Endocrine Society clinical practice    guideline. JCEM. 2011 Jul; 96(7):1911-30.   TSH + free T4     Status: None   Collection Time: 10/16/17  11:40 AM  Result Value Ref Range   TSH 1.950 0.450 - 4.500 uIU/mL   Free T4 1.09 0.82 - 1.77 ng/dL  T3, free     Status: None   Collection Time: 10/16/17 11:40 AM  Result Value Ref Range   T3, Free 2.9 2.0 - 4.4 pg/mL  Hepatitis B surface antibody     Status: Abnormal   Collection Time: 10/16/17 11:40 AM  Result Value Ref Range   Hepatitis B Surf Ab Quant <3.1 (L) Immunity>9.9 mIU/mL    Comment:   Status of Immunity                     Anti-HBs Level   ------------------                     -------------- Inconsistent with Immunity                   0.0 - 9.9 Consistent with Immunity                          >9.9   Urinalysis, Routine w reflex microscopic (not at [place])     Status: Abnormal   Collection Time: 10/16/17 11:45 AM  Result Value Ref Range   Specific Gravity, UA 1.025 1.005 - 1.030   pH, UA 5.5 5.0 - 7.5   Color, UA Yellow Yellow   Appearance Ur Clear Clear   Leukocytes, UA Trace (A) Negative   Protein, UA Negative Negative/Trace   Glucose, UA Negative Negative   Ketones, UA Negative Negative   RBC, UA Negative Negative   Bilirubin, UA Negative Negative   Urobilinogen, Ur 0.2 0.2 - 1.0 mg/dL   Nitrite, UA Negative Negative   Microscopic Examination See below:     Comment: Microscopic was indicated and was performed.  Microscopic Examination     Status: Abnormal   Collection Time: 10/16/17 11:45 AM  Result Value Ref Range   WBC, UA 0-5 0 - 5 /hpf   RBC, UA 0-2 0 - 2 /hpf   Epithelial Cells (non renal) 0-10 0 - 10 /hpf   Casts None seen None seen /lpf   Crystals Present (A) N/A   Crystal Type Calcium Oxalate N/A   Mucus, UA Present Not Estab.   Bacteria, UA Few None seen/Few  Thyrotropin receptor autoabs     Status: None   Collection Time: 11/05/17  9:43 AM  Result Value Ref Range   Thyrotropin Receptor Ab <0.50 0.00 - 1.75 IU/L  TSH + free T4     Status: None   Collection Time: 11/05/17  9:43 AM  Result Value Ref Range   TSH 3.190 0.450 - 4.500 uIU/mL    Free T4 1.05 0.82 - 1.77 ng/dL   Objective  There is no height or weight on file to calculate BMI. Wt Readings from Last 3 Encounters:  10/18/17 181 lb 6.4 oz (82.3 kg)  09/19/17 179 lb 12.8 oz (81.6 kg)  08/16/17 175 lb (79.4 kg)   Temp Readings from Last 3 Encounters:  10/31/17 98.1 F (36.7 C) (Oral)  10/18/17 98.3 F (36.8 C) (Oral)  09/19/17 98.2 F (36.8 C) (Oral)   BP Readings from Last 3 Encounters:  10/31/17 116/73  10/18/17 106/62  09/19/17 100/60   Pulse Readings from Last 3 Encounters:  10/31/17 95  10/18/17 85  09/19/17 62    Physical Exam  Constitutional: She is oriented to person, place, and time. Vital signs are normal. She appears well-developed and well-nourished. She is cooperative.  HENT:  Head: Normocephalic and atraumatic.  Mouth/Throat: Oropharynx is clear and moist and mucous membranes are normal.  Eyes: Pupils are equal, round, and reactive to light. Conjunctivae are normal.  Cardiovascular: Normal rate, regular rhythm and normal heart sounds.  Pulmonary/Chest: Effort normal and breath sounds normal.  Neurological: She is alert and oriented to person, place, and time. No cranial nerve deficit.  Skin: Skin is warm, dry and intact.  Psychiatric: She has a normal mood and affect. Her speech is normal and behavior is normal. Judgment and thought content normal. Cognition and memory are normal.  Nursing note and vitals reviewed.   Assessment   1. Migraine  Plan  1. Cont imitrex and nortriptyline  toradol 30 x 1 and depomedrol 40 x 1  Consider neurology in future  Given letter for work today   Provider: Dr. [name] McLean-Scocuzza-[facility] Medicine

## 2017-11-26 ENCOUNTER — Telehealth: Payer: Self-pay

## 2017-11-26 NOTE — Telephone Encounter (Signed)
Pharmacy notified refills need to come from her primary care provider

## 2017-11-26 NOTE — Telephone Encounter (Signed)
Please inform the pharmacy to contact her primary care provider for refills of chronic medications.

## 2017-11-26 NOTE — Telephone Encounter (Signed)
Received fax requesting refill for Sumatriptan 100mg 

## 2017-11-27 ENCOUNTER — Ambulatory Visit (INDEPENDENT_AMBULATORY_CARE_PROVIDER_SITE_OTHER): Payer: Managed Care, Other (non HMO)

## 2017-11-27 DIAGNOSIS — Z23 Encounter for immunization: Secondary | ICD-10-CM | POA: Diagnosis not present

## 2017-11-27 NOTE — Progress Notes (Signed)
Patient comes in today for a hepatitis B vaccine. Administered in left deltoid IM. Patient tolerated well.

## 2018-01-01 ENCOUNTER — Ambulatory Visit (INDEPENDENT_AMBULATORY_CARE_PROVIDER_SITE_OTHER): Payer: Managed Care, Other (non HMO)

## 2018-01-01 DIAGNOSIS — Z23 Encounter for immunization: Secondary | ICD-10-CM | POA: Diagnosis not present

## 2018-01-01 NOTE — Progress Notes (Signed)
Patient comes in for Hep B injection. Injected right deltoid. Patient tolerated injection well.

## 2018-01-21 ENCOUNTER — Encounter: Payer: Self-pay | Admitting: Internal Medicine

## 2018-01-21 ENCOUNTER — Ambulatory Visit: Payer: Managed Care, Other (non HMO) | Admitting: Internal Medicine

## 2018-01-21 VITALS — BP 98/58 | HR 101 | Temp 98.3°F | Ht 65.0 in | Wt 183.8 lb

## 2018-01-21 DIAGNOSIS — G43009 Migraine without aura, not intractable, without status migrainosus: Secondary | ICD-10-CM | POA: Diagnosis not present

## 2018-01-21 DIAGNOSIS — K5900 Constipation, unspecified: Secondary | ICD-10-CM | POA: Insufficient documentation

## 2018-01-21 DIAGNOSIS — G43909 Migraine, unspecified, not intractable, without status migrainosus: Secondary | ICD-10-CM

## 2018-01-21 DIAGNOSIS — Z23 Encounter for immunization: Secondary | ICD-10-CM | POA: Diagnosis not present

## 2018-01-21 NOTE — Patient Instructions (Addendum)
I referred to [facility] on [facility] [place]  for migraines Dr. Jaynee Eagles   (Tdap)DTaP Vaccine (Diphtheria, Tetanus, and Pertussis): What You Need to Know 1. Why get vaccinated? Diphtheria, tetanus, and pertussis are serious diseases caused by bacteria. Diphtheria and pertussis are spread from person to person. Tetanus enters the body through cuts or wounds. DIPHTHERIA causes a thick covering in the back of the throat.  It can lead to breathing problems, paralysis, heart failure, and even death.  TETANUS (Lockjaw) causes painful tightening of the muscles, usually all over the body.  It can lead to "locking" of the jaw so the victim cannot open his mouth or swallow. Tetanus leads to death in up to 2 out of 10 cases.  PERTUSSIS (Whooping Cough) causes coughing spells so bad that it is hard for infants to eat, drink, or breathe. These spells can last for weeks.  It can lead to pneumonia, seizures (jerking and staring spells), brain damage, and death.  Diphtheria, tetanus, and pertussis vaccine (DTaP) can help prevent these diseases. Most children who are vaccinated with DTaP will be protected throughout childhood. Many more children would get these diseases if we stopped vaccinating. DTaP is a safer version of an older vaccine called DTP. DTP is no longer used in the [place]. 2. Who should get DTaP vaccine and when? Children should get 5 doses of DTaP vaccine, one dose at each of the following ages:  2 months  4 months  6 months  15-18 months  4-6 years  DTaP may be given at the same time as other vaccines. 3. Some children should not get DTaP vaccine or should wait  Children with minor illnesses, such as a cold, may be vaccinated. But children who are moderately or severely ill should usually wait until they recover before getting DTaP vaccine.  Any child who had a life-threatening allergic reaction after a dose of DTaP should not get another  dose.  Any child who suffered a brain or nervous system disease within 7 days after a dose of DTaP should not get another dose.  Talk with your doctor if your child: ? had a seizure or collapsed after a dose of DTaP, ? cried non-stop for 3 hours or more after a dose of DTaP, ? had a fever over 105F after a dose of DTaP. Ask your doctor for more information. Some of these children should not get another dose of pertussis vaccine, but may get a vaccine without pertussis, called DT. 4. Older children and adults DTaP is not licensed for adolescents, adults, or children 78 years of age and older. But older people still need protection. A vaccine called Tdap is similar to DTaP. A single dose of Tdap is recommended for people 11 through 25 years of age. Another vaccine, called Td, protects against tetanus and diphtheria, but not pertussis. It is recommended every 10 years. There are separate Vaccine Information Statements for these vaccines. 5. What are the risks from DTaP vaccine? Getting diphtheria, tetanus, or pertussis disease is much riskier than getting DTaP vaccine. However, a vaccine, like any medicine, is capable of causing serious problems, such as severe allergic reactions. The risk of DTaP vaccine causing serious harm, or death, is extremely small. Mild problems (common)  Fever (up to about 1 child in 4)  Redness or swelling where the shot was given (up to about 1 child in 4)  Soreness or tenderness where the shot was given (up to about 1 child in 4)  These problems occur more often after the 4th and 5th doses of the DTaP series than after earlier doses. Sometimes the 4th or 5th dose of DTaP vaccine is followed by swelling of the entire arm or leg in which the shot was given, lasting 1-7 days (up to about 1 child in 44). Other mild problems include:  Fussiness (up to about 1 child in 3)  Tiredness or poor appetite (up to about 1 child in 10)  Vomiting (up to about 1 child in  70) These problems generally occur 1-3 days after the shot. Moderate problems (uncommon)  Seizure (jerking or staring) (about 1 child out of 14,000)  Non-stop crying, for 3 hours or more (up to about 1 child out of 1,000)  High fever, over 105F (about 1 child out of 16,000) Severe problems (very rare)  Serious allergic reaction (less than 1 out of a million doses)  Several other severe problems have been reported after DTaP vaccine. These include: ? Long-term seizures, coma, or lowered consciousness ? Permanent brain damage. These are so rare it is hard to tell if they are caused by the vaccine. Controlling fever is especially important for children who have had seizures, for any reason. It is also important if another family member has had seizures. You can reduce fever and pain by giving your child an aspirin-free pain reliever when the shot is given, and for the next 24 hours, following the package instructions. 6. What if there is a serious reaction? What should I look for? Look for anything that concerns you, such as signs of a severe allergic reaction, very high fever, or behavior changes. Signs of a severe allergic reaction can include hives, swelling of the face and throat, difficulty breathing, a fast heartbeat, dizziness, and weakness. These would start a few minutes to a few hours after the vaccination. What should I do?  If you think it is a severe allergic reaction or other emergency that can't wait, call 9-1-1 or get the person to the nearest hospital. Otherwise, call your doctor.  Afterward, the reaction should be reported to the Vaccine Adverse Event Reporting System (VAERS). Your doctor might file this report, or you can do it yourself through the VAERS web site at www.vaers.SamedayNews.es, or by calling 478-880-0738. ? VAERS is only for reporting reactions. They do not give medical advice. 7. The National Vaccine Injury Compensation Program The [employer] Vaccine Injury  Compensation Program (VICP) is a federal program that was created to compensate people who may have been injured by certain vaccines. Persons who believe they may have been injured by a vaccine can learn about the program and about filing a claim by calling (878) 802-6352 or visiting the [place] website at GoldCloset.com.ee. 8. How can I learn more?  Ask your doctor.  Call your local or state health department.  Contact the Centers for Disease Control and Prevention (CDC): ? Call (719)662-1999 (1-800-CDC-INFO) or ? Visit CDC's website at http://hunter.com/ CDC DTaP Vaccine (Diphtheria, Tetanus, and Pertussis) VIS (10/12/05) This information is not intended to replace advice given to you by your health care provider. Make sure you discuss any questions you have with your health care provider. Document Released: 03/12/2006 Document Revised: 02/03/2016 Document Reviewed: 02/03/2016 Elsevier Interactive Patient Education  2017 [employer].  Constipation, Adult Constipation is when a person has fewer bowel movements in a week than normal, has difficulty having a bowel movement, or has stools that are dry, hard, or larger than normal. Constipation may be caused by  an underlying condition. It may become worse with age if a person takes certain medicines and does not take in enough fluids. Follow these instructions at home: Eating and drinking   Eat foods that have a lot of fiber, such as fresh fruits and vegetables, whole grains, and beans.  Limit foods that are high in fat, low in fiber, or overly processed, such as french fries, hamburgers, cookies, candies, and soda.  Drink enough fluid to keep your urine clear or pale yellow. General instructions  Exercise regularly or as told by your health care provider.  Go to the restroom when you have the urge to go. Do not hold it in.  Take over-the-counter and prescription medicines only as told by your health care provider. These  include any fiber supplements.  Practice pelvic floor retraining exercises, such as deep breathing while relaxing the lower abdomen and pelvic floor relaxation during bowel movements.  Watch your condition for any changes.  Keep all follow-up visits as told by your health care provider. This is important. Contact a health care provider if:  You have pain that gets worse.  You have a fever.  You do not have a bowel movement after 4 days.  You vomit.  You are not hungry.  You lose weight.  You are bleeding from the anus.  You have thin, pencil-like stools. Get help right away if:  You have a fever and your symptoms suddenly get worse.  You leak stool or have blood in your stool.  Your abdomen is bloated.  You have severe pain in your abdomen.  You feel dizzy or you faint. This information is not intended to replace advice given to you by your health care provider. Make sure you discuss any questions you have with your health care provider. Document Released: 02/11/2004 Document Revised: 12/03/2015 Document Reviewed: 11/03/2015 Elsevier Interactive Patient Education  2018 [place] oral capsules What is this medicine? LINACLOTIDE (lin a KLOE tide) is used to treat irritable bowel syndrome (IBS) with constipation as the main problem. It may also be used for relief of chronic constipation. This medicine may be used for other purposes; ask your health care provider or pharmacist if you have questions. COMMON BRAND NAME(S): Linzess What should I tell my health care provider before I take this medicine? They need to know if you have any of these conditions: -history of stool (fecal) impaction -now have diarrhea or have diarrhea often -other medical condition -stomach or intestinal disease, including bowel obstruction or abdominal adhesions -an unusual or allergic reaction to linaclotide, other medicines, foods, dyes, or preservatives -pregnant or trying to  get pregnant -breast-feeding How should I use this medicine? Take this medicine by mouth with a glass of water. Follow the directions on the prescription label. Do not cut, crush or chew this medicine. Take on an empty stomach, at least 30 minutes before your first meal of the day. Take your medicine at regular intervals. Do not take your medicine more often than directed. Do not stop taking except on your doctor's advice. A special MedGuide will be given to you by the pharmacist with each prescription and refill. Be sure to read this information carefully each time. Talk to your pediatrician regarding the use of this medicine in children. This medicine is not approved for use in children. Overdosage: If you think you have taken too much of this medicine contact a poison control center or emergency room at once. NOTE: This medicine is only for  you. Do not share this medicine with others. What if I miss a dose? If you miss a dose, just skip that dose. Wait until your next dose, and take only that dose. Do not take double or extra doses. What may interact with this medicine? -certain medicines for bowel problems or bladder incontinence (these can cause constipation) This list may not describe all possible interactions. Give your health care provider a list of all the medicines, herbs, non-prescription drugs, or dietary supplements you use. Also tell them if you smoke, drink alcohol, or use illegal drugs. Some items may interact with your medicine. What should I watch for while using this medicine? Visit your doctor for regular check ups. Tell your doctor if your symptoms do not get better or if they get worse. Diarrhea is a common side effect of this medicine. It often begins within 2 weeks of starting this medicine. Stop taking this medicine and call your doctor if you get severe diarrhea. Stop taking this medicine and call your doctor or go to the nearest hospital emergency room right away if you  develop unusual or severe stomach-area (abdominal) pain, especially if you also have bright red, bloody stools or black stools that look like tar. What side effects may I notice from receiving this medicine? Side effects that you should report to your doctor or health care professional as soon as possible: -allergic reactions like skin rash, itching or hives, swelling of the face, lips, or tongue -black, tarry stools -bloody or watery diarrhea -new or worsening stomach pain -severe or prolonged diarrhea Side effects that usually do not require medical attention (report to your doctor or health care professional if they continue or are bothersome): -bloating -gas -loose stools This list may not describe all possible side effects. Call your doctor for medical advice about side effects. You may report side effects to FDA at 1-800-FDA-1088. Where should I keep my medicine? Keep out of the reach of children. Store at room temperature between 20 and 25 degrees C (68 and 77 degrees F). Keep this medicine in the original container. Keep tightly closed in a dry place. Do not remove the desiccant packet from the bottle, it helps to protect your medicine from moisture. Throw away any unused medicine after the expiration date. NOTE: This sheet is a summary. It may not cover all possible information. If you have questions about this medicine, talk to your doctor, pharmacist, or health care provider.  2018 Elsevier/Gold Standard (2015-06-17 12:17:04)

## 2018-01-21 NOTE — Progress Notes (Signed)
Chief Complaint  Patient presents with  . Follow-up   F/u  1. Migraines had 1 h/a 12/2017 early in the month 7/10 bad h/a at night tried imitrex doses did not help and 4 advil with cold washcloth and getting the hot shower and went to sleep with relief next day  2. Constipation no stool x 5-6 days despite otc meds laxatives and stool softners and drinking water constipation chronic problem though used to go every 2-3 days getting worse 3. Hep B had 2/3 shots   Review of Systems  Constitutional: Negative for weight loss.  HENT: Negative for hearing loss.   Respiratory: Negative for shortness of breath.   Cardiovascular: Negative for chest pain.  Musculoskeletal: Negative for joint pain.  Skin: Negative for rash.  Neurological: Positive for headaches.   Past Medical History:  Diagnosis Date  . Allergy   . Chicken pox   . Complication of anesthesia    WITH HER WISDOM TEETH SURGERY SHE HAD TO GO TO THE OR TO HAVE THIS DONE DUE TO TACHYCARDIA DUE TO GRAVES DISEASE  . Complication of anesthesia   . Dysmenorrhea   . Family history of endometriosis    mother,sister  . Graves disease    graves, hyperthyroidism   . Headache    MIGRAINES  . Migraine   . Panic attack   . UTI (urinary tract infection)    Past Surgical History:  Procedure Laterality Date  . APPENDECTOMY  2005   WITH DRAIN PLACED DUE TO ABSCESS 2005 [facility]  . BREAST BIOPSY Right 2017   had a reaction to xylo or xylo with epi where her throat closed up  . FOOT SURGERY Bilateral 2008   toes going outward surgery to turn toes inward 2010   . TONSILLECTOMY AND ADENOIDECTOMY N/A 04/17/2017   Procedure: TONSILLECTOMY AND POSSIBLE  ADENOIDECTOMY;  Surgeon: Beverly Gust, MD;  Location: [facility] ORS;  Service: ENT;  Laterality: N/A;  . WISDOM TOOTH EXTRACTION     2016   Family History  Problem Relation Age of Onset  . Graves' disease Other   . Diabetes Father   . Hyperlipidemia Father   . Graves' disease Maternal  Grandfather   . Miscarriages / Stillbirths Maternal Grandfather   . Cancer Maternal Grandfather        prostate/bladder   . Ovarian cancer Paternal Grandmother   . Cancer Paternal Grandmother        brain  . Hypertension Mother   . Learning disabilities Sister   . Cancer Maternal Grandmother        lung (small cell) >liver>pancreas smoker   . Cancer Paternal Grandfather        skin cancer   . Breast cancer Neg Hx   . Colon cancer Neg Hx   . Heart disease Neg Hx    Social History   Socioeconomic History  . Marital status: Single    Spouse name: Not on file  . Number of children: Not on file  . Years of education: Not on file  . Highest education level: Not on file  Occupational History  . Not on file  Social Needs  . Financial resource strain: Not on file  . Food insecurity:    Worry: Not on file    Inability: Not on file  . Transportation needs:    Medical: Not on file    Non-medical: Not on file  Tobacco Use  . Smoking status: Never Smoker  . Smokeless tobacco: Never Used  Substance  and Sexual Activity  . Alcohol use: No    Alcohol/week: 0.0 standard drinks  . Drug use: No  . Sexual activity: Never  Lifestyle  . Physical activity:    Days per week: Not on file    Minutes per session: Not on file  . Stress: Not on file  Relationships  . Social connections:    Talks on phone: Not on file    Gets together: Not on file    Attends religious service: Not on file    Active member of club or organization: Not on file    Attends meetings of clubs or organizations: Not on file    Relationship status: Not on file  . Intimate partner violence:    Fear of current or ex partner: Not on file    Emotionally abused: Not on file    Physically abused: Not on file    Forced sexual activity: Not on file  Other Topics Concern  . Not on file  Social History Narrative   Works [employer]    BA Degree    Wears seat belt,    Guns at home    Safe in relationship    Does not  exercise    Current Meds  Medication Sig  . cetirizine (ZYRTEC) 10 MG tablet Take 10 mg by mouth daily.  . Cholecalciferol 50000 units capsule Take 1 capsule (50,000 Units total) by mouth once a week.  . montelukast (SINGULAIR) 10 MG tablet Take 1 tablet (10 mg total) by mouth at bedtime.  . nortriptyline (PAMELOR) 25 MG capsule Take 1 capsule (25 mg total) by mouth at bedtime.  . SUMAtriptan (IMITREX) 100 MG tablet Take 100 mg by mouth every 2 (two) hours as needed for migraine. May repeat in 2 hours if headache persists or recurs.   Allergies  Allergen Reactions  . Lidocaine Anaphylaxis  . Lidocaine-Epinephrine Anaphylaxis  . Latex Rash    rash  . Tape Rash   Recent Results (from the past 2160 hour(s))  Thyrotropin receptor autoabs     Status: None   Collection Time: 11/05/17  9:43 AM  Result Value Ref Range   Thyrotropin Receptor Ab <0.50 0.00 - 1.75 IU/L  TSH + free T4     Status: None   Collection Time: 11/05/17  9:43 AM  Result Value Ref Range   TSH 3.190 0.450 - 4.500 uIU/mL   Free T4 1.05 0.82 - 1.77 ng/dL   Objective  Body mass index is 30.59 kg/m. Wt Readings from Last 3 Encounters:  01/21/18 183 lb 12.8 oz (83.4 kg)  11/22/17 185 lb 9.6 oz (84.2 kg)  10/18/17 181 lb 6.4 oz (82.3 kg)   Temp Readings from Last 3 Encounters:  01/21/18 98.3 F (36.8 C) (Oral)  11/22/17 98 F (36.7 C) (Oral)  10/31/17 98.1 F (36.7 C) (Oral)   BP Readings from Last 3 Encounters:  01/21/18 (!) 98/58  11/22/17 120/80  10/31/17 116/73   Pulse Readings from Last 3 Encounters:  01/21/18 (!) 101  11/22/17 82  10/31/17 95    Physical Exam  Constitutional: She is oriented to person, place, and time. Vital signs are normal. She appears well-developed and well-nourished. She is cooperative.  HENT:  Head: Normocephalic and atraumatic.  Mouth/Throat: Oropharynx is clear and moist and mucous membranes are normal.  Eyes: Pupils are equal, round, and reactive to light. Conjunctivae  are normal.  Cardiovascular: Normal rate, regular rhythm and normal heart sounds.  Pulmonary/Chest: Effort normal  and breath sounds normal.  Neurological: She is alert and oriented to person, place, and time. Gait normal.  Skin: Skin is warm, dry and intact.  Psychiatric: She has a normal mood and affect. Her speech is normal and behavior is normal. Judgment and thought content normal. Cognition and memory are normal.  Nursing note and vitals reviewed.   Assessment   1. migraines  2. Constipation  3. HM Plan   1. Refer Dr. Jaynee Eagles neurology [facility] on imitrex, nortriptyline nsaids prn not helping  2. Given 2 linzess samples 72 to take 1 pill qd  3.  Never had flu shot  ? Date HPV  Tdap updated today 2/3 hep B vaccines 3rd due 05/2018  STD check 03/2017 [facility] neg f/u with Dr. Leafy Ro  Will get pap from 2015 from Dr. Enzo Bi release signed today   Dentist Renato Battles Dr. Gabriel Carina notes reviewed 11/05/17 for Graves dx'ed 2005 tx'ed methimazole until 12/2013 TSH and T4 remained normal labs 2016, 2017, 2019 labs. Graves in remission though labs checked f/u based on labs   Provider: Dr. [name] McLean-Scocuzza-[facility]

## 2018-02-04 ENCOUNTER — Other Ambulatory Visit: Payer: Self-pay | Admitting: Internal Medicine

## 2018-02-04 DIAGNOSIS — G43009 Migraine without aura, not intractable, without status migrainosus: Secondary | ICD-10-CM

## 2018-02-04 MED ORDER — NORTRIPTYLINE HCL 25 MG PO CAPS
25.0000 mg | ORAL_CAPSULE | Freq: Every day | ORAL | 2 refills | Status: DC
Start: 2018-02-04 — End: 2018-07-26

## 2018-03-28 ENCOUNTER — Ambulatory Visit: Payer: Managed Care, Other (non HMO) | Admitting: Neurology

## 2018-03-28 ENCOUNTER — Encounter: Payer: Self-pay | Admitting: Neurology

## 2018-03-28 VITALS — BP 100/60 | HR 80 | Ht 65.0 in | Wt 187.0 lb

## 2018-03-28 DIAGNOSIS — G8929 Other chronic pain: Secondary | ICD-10-CM

## 2018-03-28 DIAGNOSIS — H539 Unspecified visual disturbance: Secondary | ICD-10-CM

## 2018-03-28 DIAGNOSIS — G43709 Chronic migraine without aura, not intractable, without status migrainosus: Secondary | ICD-10-CM

## 2018-03-28 DIAGNOSIS — R51 Headache with orthostatic component, not elsewhere classified: Secondary | ICD-10-CM

## 2018-03-28 DIAGNOSIS — H532 Diplopia: Secondary | ICD-10-CM | POA: Diagnosis not present

## 2018-03-28 DIAGNOSIS — R519 Headache, unspecified: Secondary | ICD-10-CM

## 2018-03-28 MED ORDER — TOPIRAMATE ER 50 MG PO CAP24: 50.0000 mg | ORAL_CAPSULE | Freq: Every day | ORAL | 11 refills | Status: DC

## 2018-03-28 MED ORDER — RIZATRIPTAN BENZOATE 10 MG PO TBDP: 10.0000 mg | ORAL_TABLET | ORAL | 11 refills | Status: DC | PRN

## 2018-03-28 MED ORDER — ONDANSETRON 4 MG PO TBDP
4.0000 mg | ORAL_TABLET | Freq: Three times a day (TID) | ORAL | 3 refills | Status: DC | PRN
Start: 2018-03-28 — End: 2018-08-12

## 2018-03-28 NOTE — Patient Instructions (Signed)
Mri brain Trokendi 25x 1 week then 50mg  every night. Email me in 2-3 weeks with updates.  Topiramate extended-release capsules What is this medicine? TOPIRAMATE (toe PYRE a mate) is used to treat seizures in adults or children with epilepsy. It is also used for the prevention of migraine headaches. This medicine may be used for other purposes; ask your health care provider or pharmacist if you have questions. COMMON BRAND NAME(S): Trokendi XR What should I tell my health care provider before I take this medicine? They need to know if you have any of these conditions: -cirrhosis of the liver or liver disease -diarrhea -glaucoma -kidney stones or kidney disease -lung disease like asthma, obstructive pulmonary disease, emphysema -metabolic acidosis -on a ketogenic diet -scheduled for surgery or a procedure -suicidal thoughts, plans, or attempt; a previous suicide attempt by you or a family member -an unusual or allergic reaction to topiramate, other medicines, foods, dyes, or preservatives -pregnant or trying to get pregnant -breast-feeding How should I use this medicine? Take this medicine by mouth with a glass of water. Follow the directions on the prescription label. Trokendi XR capsules must be swallowed whole. Do not sprinkle on food, break, crush, dissolve, or chew. Qudexy XR capsules may be swallowed whole or opened and sprinkled on a small amount of soft food. This mixture must be swallowed immediately. Do not chew or store mixture for later use. You may take this medicine with meals. Take your medicine at regular intervals. Do not take it more often than directed. Talk to your pediatrician regarding the use of this medicine in children. Special care may be needed. While Trokendi XR may be prescribed for children as young as 6 years and Qudexy XR may be prescribed for children as young as 2 years for selected conditions, precautions do apply. Overdosage: If you think you have taken too  much of this medicine contact a poison control center or emergency room at once. NOTE: This medicine is only for you. Do not share this medicine with others. What if I miss a dose? If you miss a dose, take it as soon as you can. If it is almost time for your next dose, take only that dose. Do not take double or extra doses. What may interact with this medicine? Do not take this medicine with any of the following medications: -probenecid This medicine may also interact with the following medications: -acetazolamide -alcohol -amitriptyline -birth control pills -digoxin -hydrochlorothiazide -lithium -medicines for pain, sleep, or muscle relaxation -metformin -methazolamide -other seizure or epilepsy medicines -pioglitazone -risperidone This list may not describe all possible interactions. Give your health care provider a list of all the medicines, herbs, non-prescription drugs, or dietary supplements you use. Also tell them if you smoke, drink alcohol, or use illegal drugs. Some items may interact with your medicine. What should I watch for while using this medicine? Visit your doctor or health care professional for regular checks on your progress. Do not stop taking this medicine suddenly. This increases the risk of seizures if you are using this medicine to control epilepsy. Wear a medical identification bracelet or chain to say you have epilepsy or seizures, and carry a card that lists all your medicines. This medicine can decrease sweating and increase your body temperature. Watch for signs of deceased sweating or fever, especially in children. Avoid extreme heat, hot baths, and saunas. Be careful about exercising, especially in hot weather. Contact your health care provider right away if you notice a fever or  decrease in sweating. You should drink plenty of fluids while taking this medicine. If you have had kidney stones in the past, this will help to reduce your chances of forming kidney  stones. If you have stomach pain, with nausea or vomiting and yellowing of your eyes or skin, call your doctor immediately. You may get drowsy, dizzy, or have blurred vision. Do not drive, use machinery, or do anything that needs mental alertness until you know how this medicine affects you. To reduce dizziness, do not sit or stand up quickly, especially if you are an older patient. Alcohol can increase drowsiness and dizziness. Avoid alcoholic drinks. Do not drink alcohol for 6 hours before or 6 hours after taking Trokendi XR. If you notice blurred vision, eye pain, or other eye problems, seek medical attention at once for an eye exam. The use of this medicine may increase the chance of suicidal thoughts or actions. Pay special attention to how you are responding while on this medicine. Any worsening of mood, or thoughts of suicide or dying should be reported to your health care professional right away. This medicine may increase the chance of developing metabolic acidosis. If left untreated, this can cause kidney stones, bone disease, or slowed growth in children. Symptoms include breathing fast, fatigue, loss of appetite, irregular heartbeat, or loss of consciousness. Call your doctor immediately if you experience any of these side effects. Also, tell your doctor about any surgery you plan on having while taking this medicine since this may increase your risk for metabolic acidosis. Birth control pills may not work properly while you are taking this medicine. Talk to your doctor about using an extra method of birth control. Women who become pregnant while using this medicine may enroll in the [place] Pregnancy Registry by calling [phone]. This registry collects information about the safety of antiepileptic drug use during pregnancy. What side effects may I notice from receiving this medicine? Side effects that you should report to your doctor or health care professional  as soon as possible: -allergic reactions like skin rash, itching or hives, swelling of the face, lips, or tongue -decreased sweating and/or rise in body temperature -depression -difficulty breathing, fast or irregular breathing patterns -difficulty speaking -difficulty walking or controlling muscle movements -hearing impairment -redness, blistering, peeling or loosening of the skin, including inside the mouth -tingling, pain or numbness in the hands or feet -unusually weak or tired -worsening of mood, thoughts or actions of suicide or dying Side effects that usually do not require medical attention (report to your doctor or health care professional if they continue or are bothersome): -altered taste -back pain, joint or muscle aches and pains -diarrhea, or constipation -headache -loss of appetite -nausea -stomach upset, indigestion -tremors This list may not describe all possible side effects. Call your doctor for medical advice about side effects. You may report side effects to FDA at 1-800-FDA-1088. Where should I keep my medicine? Keep out of the reach of children. Store at room temperature between 15 and 30 degrees C (59 and 86 degrees F) in a tightly closed container. Protect from moisture. Throw away any unused medicine after the expiration date. NOTE: This sheet is a summary. It may not cover all possible information. If you have questions about this medicine, talk to your doctor, pharmacist, or health care provider.  2018 Elsevier/Gold Standard (2015-09-03 12:33:11)

## 2018-03-28 NOTE — Progress Notes (Signed)
[facility]    Provider:  Dr Jaynee Eagles Referring Provider: McLean-Scocuzza, [name] * Primary Care Physician:  McLean-Scocuzza, [name], MD  CC:  migraine  HPI:  Ruth Tran is a 27 y.o. female here as requested by Dr. Terese Door for migraine.PMHx migraine, panic attack, graves disease. Migraines started in 2017 when she started college at [employer]. Sumatriptan worked for a while. Light and sound trigger. Progressively getting worse. 20 headache days. 10 migraine days a month and those can be moderately severe to severe. She has been having blurry vision and diplopia. Unilateral and spreads, worse positionally, fluid in the head like underwater, pounding/throbbing/pulsating, +photo/phono/osmophobia. Feeling of fluid in the head. + nausea. +vomiting. She can wake with the migraines and headaches. She has vision changes, difficulty with seeing, blurry maybe double. Can last 24-72 hours. Mother and MGM with migraines. No aura. May or may not have an aura of blurry vision. No dizziness, no known triggers except smell and light. No other focal neurologic deficits, associated symptoms, inciting events or modifiable factors.   Tried: Imitrex, nortriptyline, celexa  Reviewed notes, labs and imaging from outside physicians, which showed:  Reviewed notes from McLean-Scocuzza, [name], MD. patient has a history of migraines.  Was last seen in August by referring physician.  She had one headache in August 2019 early in the morning which was severe 7 out of 10 bad headache at night tried Imitrex doses did not help and for Advil with cold washcloth getting in the hot shower and went to sleep with relief next day.  She is tried Imitrex, nortriptyline, NSAIDs not helping.  She has severe constipation on Linzess.  Prior appointments with referring physician she also discussed migraines at work, the son triggers the migraines, Imitrex and NSAIDs for acute management, migraines not  triggered by cycles, she has nausea with the headaches.   CT head showed No acute intracranial abnormalities including mass lesion or mass effect, hydrocephalus, extra-axial fluid collection, midline shift, hemorrhage, or acute infarction, large ischemic events (personally reviewed images)     Review of Systems: Patient complains of symptoms per HPI as well as the following symptoms: headache, double vision, blurred vision. Pertinent negatives and positives per HPI. All others negative.   Social History   Socioeconomic History  . Marital status: Single    Spouse name: Not on file  . Number of children: 0  . Years of education: Not on file  . Highest education level: Bachelor's degree (e.g., BA, AB, BS)  Occupational History  . Not on file  Social Needs  . Financial resource strain: Not on file  . Food insecurity:    Worry: Not on file    Inability: Not on file  . Transportation needs:    Medical: Not on file    Non-medical: Not on file  Tobacco Use  . Smoking status: Never Smoker  . Smokeless tobacco: Never Used  Substance and Sexual Activity  . Alcohol use: No    Alcohol/week: 0.0 standard drinks  . Drug use: No  . Sexual activity: Never    Birth control/protection: None  Lifestyle  . Physical activity:    Days per week: Not on file    Minutes per session: Not on file  . Stress: Not on file  Relationships  . Social connections:    Talks on phone: Not on file    Gets together: Not on file    Attends religious service: Not on file    Active member  of club or organization: Not on file    Attends meetings of clubs or organizations: Not on file    Relationship status: Not on file  . Intimate partner violence:    Fear of current or ex partner: Not on file    Emotionally abused: Not on file    Physically abused: Not on file    Forced sexual activity: Not on file  Other Topics Concern  . Not on file  Social History Narrative   Works [employer]    BA Degree     Wears seat belt,    Guns at home    Safe in relationship    Does not exercise    Caffeine: cup of soda or tea daily    Family History  Problem Relation Age of Onset  . Graves' disease Other   . Diabetes Father   . Hyperlipidemia Father   . Graves' disease Maternal Grandfather   . Miscarriages / Stillbirths Maternal Grandfather   . Cancer Maternal Grandfather        prostate/bladder   . Ovarian cancer Paternal Grandmother   . Cancer Paternal Grandmother        brain  . Hypertension Mother   . Migraines Mother   . Learning disabilities Sister   . Cancer Maternal Grandmother        lung (small cell) >liver>pancreas smoker   . Migraines Maternal Grandmother   . Cancer Paternal Grandfather        skin cancer   . Breast cancer Neg Hx   . Colon cancer Neg Hx   . Heart disease Neg Hx     Past Medical History:  Diagnosis Date  . Allergy   . Chicken pox   . Complication of anesthesia    WITH HER WISDOM TEETH SURGERY SHE HAD TO GO TO THE OR TO HAVE THIS DONE DUE TO TACHYCARDIA DUE TO GRAVES DISEASE  . Complication of anesthesia   . Dysmenorrhea   . Family history of endometriosis    mother,sister  . Graves disease    graves, hyperthyroidism   . Headache    MIGRAINES  . Migraine   . Panic attack   . UTI (urinary tract infection)     Past Surgical History:  Procedure Laterality Date  . APPENDECTOMY  2005   WITH DRAIN PLACED DUE TO ABSCESS 2005 [facility]  . BREAST BIOPSY Right 2017   had a reaction to xylo or xylo with epi where her throat closed up  . FOOT SURGERY Bilateral 2008   toes going outward surgery to turn toes inward 2010   . TONSILLECTOMY AND ADENOIDECTOMY N/A 04/17/2017   Procedure: TONSILLECTOMY AND POSSIBLE  ADENOIDECTOMY;  Surgeon: Beverly Gust, MD;  Location: [facility] ORS;  Service: ENT;  Laterality: N/A;  . WISDOM TOOTH EXTRACTION     2016    Current Outpatient Medications  Medication Sig Dispense Refill  . cetirizine (ZYRTEC) 10 MG tablet Take 10 mg  by mouth daily.    . Cholecalciferol 50000 units capsule Take 1 capsule (50,000 Units total) by mouth once a week. 13 capsule 1  . montelukast (SINGULAIR) 10 MG tablet Take 1 tablet (10 mg total) by mouth at bedtime. 90 tablet 3  . nortriptyline (PAMELOR) 25 MG capsule Take 1 capsule (25 mg total) by mouth at bedtime. 30 capsule 2  . SUMAtriptan (IMITREX) 100 MG tablet Take 100 mg by mouth every 2 (two) hours as needed for migraine. May repeat in 2 hours if  headache persists or recurs.    . ondansetron (ZOFRAN-ODT) 4 MG disintegrating tablet Take 1 tablet (4 mg total) by mouth every 8 (eight) hours as needed for nausea. 30 tablet 3  . rizatriptan (MAXALT-MLT) 10 MG disintegrating tablet Take 1 tablet (10 mg total) by mouth as needed for migraine. May repeat in 2 hours if needed. Max twice in one day. 9 tablet 11  . Topiramate ER (TROKENDI XR) 50 MG CP24 Take 50 mg by mouth at bedtime. 30 capsule 11   No current facility-administered medications for this visit.     Allergies as of 03/28/2018 - Review Complete 03/28/2018  Allergen Reaction Noted  . Lidocaine Anaphylaxis 04/10/2017  . Lidocaine-epinephrine Anaphylaxis 04/10/2017  . Latex Rash 01/22/2014  . Tape Rash 01/22/2014    Vitals: BP 100/60 (BP Location: Right Arm, Patient Position: Sitting)   Pulse 80   Ht 5\' 5"  (1.651 m)   Wt 187 lb (84.8 kg)   LMP 03/20/2018 (Exact Date)   BMI 31.12 kg/m  Last Weight:  Wt Readings from Last 1 Encounters:  03/28/18 187 lb (84.8 kg)   Last Height:   Ht Readings from Last 1 Encounters:  03/28/18 5\' 5"  (1.651 m)    Physical exam: Exam: Gen: NAD, conversant, well nourised, obese, well groomed                     CV: RRR, no MRG. No Carotid Bruits. No peripheral edema, warm, nontender Eyes: Conjunctivae clear without exudates or hemorrhage  Neuro: Detailed Neurologic Exam  Speech:    Speech is normal; fluent and spontaneous with normal comprehension.  Cognition:    The patient is  oriented to person, place, and time;     recent and remote memory intact;     language fluent;     normal attention, concentration,     fund of knowledge Cranial Nerves:    The pupils are equal, round, and reactive to light. The fundi are normal and spontaneous venous pulsations are present. Visual fields are full to finger confrontation. Extraocular movements are intact. Trigeminal sensation is intact and the muscles of mastication are normal. The face is symmetric. The palate elevates in the midline. Hearing intact. Voice is normal. Shoulder shrug is normal. The tongue has normal motion without fasciculations.   Coordination:    Normal finger to nose and heel to shin. Normal rapid alternating movements.   Gait:    Heel-toe and tandem gait are normal.   Motor Observation:    No asymmetry, no atrophy, and no involuntary movements noted. Tone:    Normal muscle tone.    Posture:    Posture is normal. normal erect    Strength:    Strength is V/V in the upper and lower limbs.      Sensation: intact to LT     Reflex Exam:  DTR's:    Deep tendon reflexes in the upper and lower extremities are normal bilaterally.   Toes:    The toes are downgoing bilaterally.   Clonus:    Clonus is absent.      Assessment/Plan:  Patient with migraines however given concerning symptoms needs a thorough evaluation.  MRI brain due to concerning symptoms of morning headaches, positional headaches,vision changes  to look for space occupying mass, chiari or intracranial hypertension (pseudotumor).  Migraine prevention: Start Trokendi 25x 1 week then 50mg  every night. Email me in 2-3 weeks with updates so we can prescribe 50mg  or increase to 100mg   Acute: Maxalt and zofran  Orders Placed This Encounter  Procedures  . MR BRAIN W WO CONTRAST     Meds ordered this encounter  Medications  . Topiramate ER (TROKENDI XR) 50 MG CP24    Sig: Take 50 mg by mouth at bedtime.    Dispense:  30 capsule     Refill:  11  . DISCONTD: rizatriptan (MAXALT-MLT) 10 MG disintegrating tablet    Sig: Take 1 tablet (10 mg total) by mouth as needed for migraine. May repeat in 2 hours if needed    Dispense:  9 tablet    Refill:  11  . ondansetron (ZOFRAN-ODT) 4 MG disintegrating tablet    Sig: Take 1 tablet (4 mg total) by mouth every 8 (eight) hours as needed for nausea.    Dispense:  30 tablet    Refill:  3  . rizatriptan (MAXALT-MLT) 10 MG disintegrating tablet    Sig: Take 1 tablet (10 mg total) by mouth as needed for migraine. May repeat in 2 hours if needed. Max twice in one day.    Dispense:  9 tablet    Refill:  11     Discussed: To prevent or relieve headaches, try the following: Cool Compress. Lie down and place a cool compress on your head.  Avoid headache triggers. If certain foods or odors seem to have triggered your migraines in the past, avoid them. A headache diary might help you identify triggers.  Include physical activity in your daily routine. Try a daily walk or other moderate aerobic exercise.  Manage stress. Find healthy ways to cope with the stressors, such as delegating tasks on your to-do list.  Practice relaxation techniques. Try deep breathing, yoga, massage and visualization.  Eat regularly. Eating regularly scheduled meals and maintaining a healthy diet might help prevent headaches. Also, drink plenty of fluids.  Follow a regular sleep schedule. Sleep deprivation might contribute to headaches Consider biofeedback. With this mind-body technique, you learn to control certain bodily functions - such as muscle tension, heart rate and blood pressure - to prevent headaches or reduce headache pain.    Proceed to emergency room if you experience new or worsening symptoms or symptoms do not resolve, if you have new neurologic symptoms or if headache is severe, or for any concerning symptom.   Provided education and documentation from American headache Society toolbox including  articles on: chronic migraine medication overuse headache, chronic migraines, prevention of migraines, behavioral and other nonpharmacologic treatments for headache.   Cc: Dr. Loetta Rough, [facility] [address] [place], [place] [address]  Phone [phone] Fax [phone]

## 2018-04-01 ENCOUNTER — Telehealth: Payer: Self-pay | Admitting: Neurology

## 2018-04-01 DIAGNOSIS — G43709 Chronic migraine without aura, not intractable, without status migrainosus: Secondary | ICD-10-CM | POA: Insufficient documentation

## 2018-04-01 NOTE — Telephone Encounter (Signed)
[employer] order sent to [facility] for pt to be aware of this. I also left GI phone number of [phone] and to give them a call if she has not heard in the next 2-3 business days.

## 2018-05-06 NOTE — Telephone Encounter (Signed)
Novella Rob: [id number] (exp. 05/02/18 to 07/31/18) pt is scheduled at GI for 05/09/18.

## 2018-05-09 ENCOUNTER — Ambulatory Visit
Admission: RE | Admit: 2018-05-09 | Discharge: 2018-05-09 | Disposition: A | Discharge status: Home / Self Care | Payer: Managed Care, Other (non HMO) | Source: Ambulatory Visit | Attending: Neurology | Admitting: Neurology

## 2018-05-09 DIAGNOSIS — H532 Diplopia: Secondary | ICD-10-CM

## 2018-05-09 DIAGNOSIS — R51 Headache with orthostatic component, not elsewhere classified: Secondary | ICD-10-CM

## 2018-05-09 DIAGNOSIS — H539 Unspecified visual disturbance: Secondary | ICD-10-CM

## 2018-05-09 DIAGNOSIS — R519 Headache, unspecified: Secondary | ICD-10-CM

## 2018-05-09 DIAGNOSIS — G8929 Other chronic pain: Secondary | ICD-10-CM

## 2018-05-09 MED ORDER — GADOBENATE DIMEGLUMINE 529 MG/ML IV SOLN
18.0000 mL | Freq: Once | INTRAVENOUS | Status: AC | PRN
  Administered 2018-05-09: 18 mL via INTRAVENOUS

## 2018-05-13 ENCOUNTER — Telehealth: Payer: Self-pay | Admitting: *Deleted

## 2018-05-13 NOTE — Telephone Encounter (Signed)
-----   Message from Melvenia Beam, MD sent at 05/12/2018  7:36 PM EST ----- MRI of the brain is fine, nothing concerning. Thanks dr Jaynee Eagles

## 2018-05-13 NOTE — Telephone Encounter (Signed)
Spoke with pt and advised her that per Dr. Jaynee Eagles, her MRI of the brain is fine, nothing concerning. Pt verbalized understanding and appreciation. She had no questions.

## 2018-05-17 ENCOUNTER — Encounter: Payer: Self-pay | Admitting: Family Medicine

## 2018-05-17 ENCOUNTER — Ambulatory Visit: Payer: Managed Care, Other (non HMO) | Admitting: Family Medicine

## 2018-05-17 VITALS — BP 116/62 | HR 80 | Temp 98.4°F | Ht 65.0 in | Wt 187.8 lb

## 2018-05-17 DIAGNOSIS — J069 Acute upper respiratory infection, unspecified: Secondary | ICD-10-CM | POA: Diagnosis not present

## 2018-05-17 DIAGNOSIS — B9789 Other viral agents as the cause of diseases classified elsewhere: Secondary | ICD-10-CM | POA: Diagnosis not present

## 2018-05-17 MED ORDER — FLUTICASONE PROPIONATE HFA 110 MCG/ACT IN AERO: 2.0000 | INHALATION_SPRAY | Freq: Two times a day (BID) | RESPIRATORY_TRACT | 1 refills | Status: DC

## 2018-05-17 MED ORDER — GUAIFENESIN-CODEINE 100-10 MG/5ML PO SYRP: 5.0000 mL | ORAL_SOLUTION | Freq: Three times a day (TID) | ORAL | 0 refills | Status: DC | PRN

## 2018-05-17 NOTE — Patient Instructions (Signed)
Drink plenty of fluids, especially water Rest Take cheratussin cough syrup as needed - may make you sleepy Use flovent 2 puffs twice per day x 1-2 weeks Use humidifier at night Follow-up if symptoms worsen or do not improve in 1 week

## 2018-05-17 NOTE — Progress Notes (Signed)
Ruth Tran is a 27 y.o. female  Chief Complaint  Patient presents with  . URI    Patient is here today C/O possible URI.  She has had Sx for 3-4d.  Throat pain has been intermittent but cough has been persistent and non-productive.  States that if she keeps coughing she will vomit.  Denies any sinus involvent. Tx with Mucinex & Delsym but Sx persist.    HPI: Ruth Tran is a 27 y.o. female with 3-4 day h/o nonproductive cough. Cough is keeping pt up at night. No SOB.  No facial pressure, headache, nasal congestion. No fever, chills. + sore throat No known sick contacts.  Pt has been taking Mucinex, Delsym without improvement.   Past Medical History:  Diagnosis Date  . Allergy   . Chicken pox   . Complication of anesthesia    WITH HER WISDOM TEETH SURGERY SHE HAD TO GO TO THE OR TO HAVE THIS DONE DUE TO TACHYCARDIA DUE TO GRAVES DISEASE  . Complication of anesthesia   . Dysmenorrhea   . Family history of endometriosis    mother,sister  . Graves disease    graves, hyperthyroidism   . Headache    MIGRAINES  . Migraine   . Panic attack   . UTI (urinary tract infection)     Past Surgical History:  Procedure Laterality Date  . APPENDECTOMY  2005   WITH DRAIN PLACED DUE TO ABSCESS 2005 [facility]  . BREAST BIOPSY Right 2017   had a reaction to xylo or xylo with epi where her throat closed up  . FOOT SURGERY Bilateral 2008   toes going outward surgery to turn toes inward 2010   . TONSILLECTOMY AND ADENOIDECTOMY N/A 04/17/2017   Procedure: TONSILLECTOMY AND POSSIBLE  ADENOIDECTOMY;  Surgeon: Beverly Gust, MD;  Location: [facility] ORS;  Service: ENT;  Laterality: N/A;  . WISDOM TOOTH EXTRACTION     2016    Social History   Socioeconomic History  . Marital status: Single    Spouse name: Not on file  . Number of children: 0  . Years of education: Not on file  . Highest education level: Bachelor's degree (e.g., BA, AB, BS)  Occupational History  . Not on file    Social Needs  . Financial resource strain: Not on file  . Food insecurity:    Worry: Not on file    Inability: Not on file  . Transportation needs:    Medical: Not on file    Non-medical: Not on file  Tobacco Use  . Smoking status: Never Smoker  . Smokeless tobacco: Never Used  Substance and Sexual Activity  . Alcohol use: No    Alcohol/week: 0.0 standard drinks  . Drug use: No  . Sexual activity: Never    Birth control/protection: None  Lifestyle  . Physical activity:    Days per week: Not on file    Minutes per session: Not on file  . Stress: Not on file  Relationships  . Social connections:    Talks on phone: Not on file    Gets together: Not on file    Attends religious service: Not on file    Active member of club or organization: Not on file    Attends meetings of clubs or organizations: Not on file    Relationship status: Not on file  . Intimate partner violence:    Fear of current or ex partner: Not on file    Emotionally abused: Not  on file    Physically abused: Not on file    Forced sexual activity: Not on file  Other Topics Concern  . Not on file  Social History Narrative   Works [employer]    BA Degree    Wears seat belt,    Guns at home    Safe in relationship    Does not exercise    Caffeine: cup of soda or tea daily    Family History  Problem Relation Age of Onset  . Graves' disease Other   . Diabetes Father   . Hyperlipidemia Father   . Graves' disease Maternal Grandfather   . Miscarriages / Stillbirths Maternal Grandfather   . Cancer Maternal Grandfather        prostate/bladder   . Ovarian cancer Paternal Grandmother   . Cancer Paternal Grandmother        brain  . Hypertension Mother   . Migraines Mother   . Learning disabilities Sister   . Cancer Maternal Grandmother        lung (small cell) >liver>pancreas smoker   . Migraines Maternal Grandmother   . Cancer Paternal Grandfather        skin cancer   . Breast cancer Neg Hx   .  Colon cancer Neg Hx   . Heart disease Neg Hx      Immunization History  Administered Date(s) Administered  . DTaP 06/01/1993, 07/28/1993, 09/29/1993, 07/03/1994, 04/06/2008  . Hepatitis A 10/07/2007, 05/26/2008  . Hepatitis B 1991-04-19, 06/01/1993, 09/29/1993  . Hepatitis B, adult 11/27/2017, 01/01/2018  . HiB (PRP-OMP) 06/01/1993, 07/28/1993, 09/29/1993, 07/03/1994  . IPV 06/01/1993, 07/28/1993, 09/29/1993, 04/06/1994  . MMR 07/03/1994, 04/06/1998  . PPD Test 04/21/2015  . Td 10/07/2007  . Tdap 10/07/2007, 01/21/2018  . Varicella 10/27/1996, 10/07/2007    Outpatient Encounter Medications as of 05/17/2018  Medication Sig  . cetirizine (ZYRTEC) 10 MG tablet Take 10 mg by mouth daily.  . montelukast (SINGULAIR) 10 MG tablet Take 1 tablet (10 mg total) by mouth at bedtime.  . norgestimate-ethinyl estradiol (ORTHO-CYCLEN,SPRINTEC,PREVIFEM) 0.25-35 MG-MCG tablet Take 1 tablet by mouth daily.  . nortriptyline (PAMELOR) 25 MG capsule Take 1 capsule (25 mg total) by mouth at bedtime.  . ondansetron (ZOFRAN-ODT) 4 MG disintegrating tablet Take 1 tablet (4 mg total) by mouth every 8 (eight) hours as needed for nausea.  . rizatriptan (MAXALT-MLT) 10 MG disintegrating tablet Take 1 tablet (10 mg total) by mouth as needed for migraine. May repeat in 2 hours if needed. Max twice in one day.  . SUMAtriptan (IMITREX) 100 MG tablet Take 100 mg by mouth every 2 (two) hours as needed for migraine. May repeat in 2 hours if headache persists or recurs.  . Topiramate ER (TROKENDI XR) 50 MG CP24 Take 50 mg by mouth at bedtime.  . [DISCONTINUED] Cholecalciferol 50000 units capsule Take 1 capsule (50,000 Units total) by mouth once a week.   No facility-administered encounter medications on file as of 05/17/2018.      ROS: Pertinent positives and negatives noted in HPI. Remainder of ROS non-contributory   Allergies  Allergen Reactions  . Lidocaine Anaphylaxis  . Lidocaine-Epinephrine Anaphylaxis  .  Latex Rash    rash  . Tape Rash    BP 116/62 (BP Location: Right Arm, Patient Position: Sitting, Cuff Size: Normal)   Pulse 80   Temp 98.4 F (36.9 C) (Oral)   Ht '5\' 5"'  (1.651 m)   Wt 187 lb 12.8 oz (85.2 kg)  LMP 04/21/2018   SpO2 98%   BMI 31.25 kg/m   Physical Exam  Constitutional: She is oriented to person, place, and time. She appears well-developed and well-nourished. No distress.  HENT:  Head: Normocephalic and atraumatic.  Right Ear: Tympanic membrane and ear canal normal.  Left Ear: Tympanic membrane and ear canal normal.  Nose: Nose normal. Right sinus exhibits no maxillary sinus tenderness and no frontal sinus tenderness. Left sinus exhibits no maxillary sinus tenderness and no frontal sinus tenderness.  Mouth/Throat: Oropharynx is clear and moist. No oropharyngeal exudate, posterior oropharyngeal edema or posterior oropharyngeal erythema.  Eyes: Pupils are equal, round, and reactive to light. Conjunctivae and EOM are normal. Right eye exhibits no discharge. Left eye exhibits no discharge.  Neck: Neck supple.  Cardiovascular: Normal rate, regular rhythm and normal heart sounds.  Pulmonary/Chest: Effort normal. No stridor. No respiratory distress. She has wheezes (faint B/L expir wheeze). She has no rhonchi.  Lymphadenopathy:    She has no cervical adenopathy.  Neurological: She is alert and oriented to person, place, and time.  Skin: Skin is warm and dry.  Psychiatric: She has a normal mood and affect. Her behavior is normal.     A/P:  1. Viral URI with cough - cont supportive care Rx: - guaiFENesin-codeine (CHERATUSSIN AC) 100-10 MG/5ML syrup; Take 5 mLs by mouth 3 (three) times daily as needed for cough.  Dispense: 120 mL; Refill: 0 - fluticasone (FLOVENT HFA) 110 MCG/ACT inhaler; Inhale 2 puffs into the lungs 2 (two) times daily.  Dispense: 1 Inhaler; Refill: 1 - if symptoms worsen or do not improve, pt will call in 5-7 days and I agreed to the possibility of  sending Rx for abx as pt lives 40 min away from office Discussed plan and reviewed medications with patient, including risks, benefits, and potential side effects. Pt expressed understand. All questions answered.

## 2018-05-23 ENCOUNTER — Encounter: Payer: Self-pay | Admitting: Internal Medicine

## 2018-05-23 ENCOUNTER — Ambulatory Visit (INDEPENDENT_AMBULATORY_CARE_PROVIDER_SITE_OTHER): Payer: Managed Care, Other (non HMO) | Admitting: Internal Medicine

## 2018-05-23 VITALS — BP 110/78 | HR 91 | Temp 97.6°F | Ht 65.0 in | Wt 186.6 lb

## 2018-05-23 DIAGNOSIS — G43709 Chronic migraine without aura, not intractable, without status migrainosus: Secondary | ICD-10-CM | POA: Diagnosis not present

## 2018-05-23 DIAGNOSIS — K5900 Constipation, unspecified: Secondary | ICD-10-CM

## 2018-05-23 DIAGNOSIS — R05 Cough: Secondary | ICD-10-CM | POA: Diagnosis not present

## 2018-05-23 DIAGNOSIS — G43009 Migraine without aura, not intractable, without status migrainosus: Secondary | ICD-10-CM | POA: Diagnosis not present

## 2018-05-23 DIAGNOSIS — R059 Cough, unspecified: Secondary | ICD-10-CM

## 2018-05-23 MED ORDER — AZITHROMYCIN 250 MG PO TABS: ORAL_TABLET | ORAL | 0 refills | Status: DC

## 2018-05-23 NOTE — Progress Notes (Signed)
Chief Complaint  Patient presents with  . Follow-up   F/u  1. Migraines on topiramate ER 50 mg qd and maxalt MLT not helping though maxalt MLT helps sometimes. Cheratussin AC not helping  2. URI and cough green to clear phelgms she has not been having fever  3. Constipation linzess 72 causes ab cramps   Review of Systems  Constitutional: Negative for fever and weight loss.  HENT: Negative for hearing loss.   Eyes: Negative for blurred vision.  Respiratory: Positive for cough.   Cardiovascular: Negative for chest pain.  Skin: Negative for rash.  Neurological: Positive for headaches.  Psychiatric/Behavioral: Negative for depression.   Past Medical History:  Diagnosis Date  . Allergy   . Chicken pox   . Complication of anesthesia    WITH HER WISDOM TEETH SURGERY SHE HAD TO GO TO THE OR TO HAVE THIS DONE DUE TO TACHYCARDIA DUE TO GRAVES DISEASE  . Complication of anesthesia   . Dysmenorrhea   . Family history of endometriosis    mother,sister  . Graves disease    graves, hyperthyroidism   . Headache    MIGRAINES  . Migraine   . Panic attack   . UTI (urinary tract infection)    Past Surgical History:  Procedure Laterality Date  . APPENDECTOMY  2005   WITH DRAIN PLACED DUE TO ABSCESS 2005 [facility]  . BREAST BIOPSY Right 2017   had a reaction to xylo or xylo with epi where her throat closed up  . FOOT SURGERY Bilateral 2008   toes going outward surgery to turn toes inward 2010   . TONSILLECTOMY AND ADENOIDECTOMY N/A 04/17/2017   Procedure: TONSILLECTOMY AND POSSIBLE  ADENOIDECTOMY;  Surgeon: Beverly Gust, MD;  Location: [facility] OR[facility];  Service: ENT;  Laterality: N/A;  . WISDOM TOOTH EXTRACTION     2016   Family History  Problem Relation Age of Onset  . Graves' disease Other   . Diabetes Father   . Hyperlipidemia Father   . Graves' disease Maternal Grandfather   . Miscarriages / Stillbirths Maternal Grandfather   . Cancer Maternal Grandfather        prostate/bladder   .  Ovarian cancer Paternal Grandmother   . Cancer Paternal Grandmother        brain  . Hypertension Mother   . Migraines Mother   . Learning disabilities Sister   . Cancer Maternal Grandmother        lung (small cell) >liver>pancreas smoker   . Migraines Maternal Grandmother   . Cancer Paternal Grandfather        skin cancer   . Breast cancer Neg Hx   . Colon cancer Neg Hx   . Heart disease Neg Hx    Social History   Socioeconomic History  . Marital status: Single    Spouse name: Not on file  . Number of children: 0  . Years of education: Not on file  . Highest education level: Bachelor's degree (e.g., BA, AB, BS)  Occupational History  . Not on file  Social Needs  . Financial resource strain: Not on file  . Food insecurity:    Worry: Not on file    Inability: Not on file  . Transportation needs:    Medical: Not on file    Non-medical: Not on file  Tobacco Use  . Smoking status: Never Smoker  . Smokeless tobacco: Never Used  Substance and Sexual Activity  . Alcohol use: No    Alcohol/week: 0.0 standard  drinks  . Drug use: No  . Sexual activity: Never    Birth control/protection: None  Lifestyle  . Physical activity:    Days per week: Not on file    Minutes per session: Not on file  . Stress: Not on file  Relationships  . Social connections:    Talks on phone: Not on file    Gets together: Not on file    Attends religious service: Not on file    Active member of club or organization: Not on file    Attends meetings of clubs or organizations: Not on file    Relationship status: Not on file  . Intimate partner violence:    Fear of current or ex partner: Not on file    Emotionally abused: Not on file    Physically abused: Not on file    Forced sexual activity: Not on file  Other Topics Concern  . Not on file  Social History Narrative   Works [employer]    BA Degree    Wears seat belt,    Guns at home    Safe in relationship    Does not exercise     Caffeine: cup of soda or tea daily   Current Meds  Medication Sig  . cetirizine (ZYRTEC) 10 MG tablet Take 10 mg by mouth daily.  . fluticasone (FLOVENT HFA) 110 MCG/ACT inhaler Inhale 2 puffs into the lungs 2 (two) times daily.  Marland Kitchen guaiFENesin-codeine (CHERATUSSIN AC) 100-10 MG/5ML syrup Take 5 mLs by mouth 3 (three) times daily as needed for cough.  . montelukast (SINGULAIR) 10 MG tablet Take 1 tablet (10 mg total) by mouth at bedtime.  . norgestimate-ethinyl estradiol (ORTHO-CYCLEN,SPRINTEC,PREVIFEM) 0.25-35 MG-MCG tablet Take 1 tablet by mouth daily.  . nortriptyline (PAMELOR) 25 MG capsule Take 1 capsule (25 mg total) by mouth at bedtime.  . ondansetron (ZOFRAN-ODT) 4 MG disintegrating tablet Take 1 tablet (4 mg total) by mouth every 8 (eight) hours as needed for nausea.  . rizatriptan (MAXALT-MLT) 10 MG disintegrating tablet Take 1 tablet (10 mg total) by mouth as needed for migraine. May repeat in 2 hours if needed. Max twice in one day.  . SUMAtriptan (IMITREX) 100 MG tablet Take 100 mg by mouth every 2 (two) hours as needed for migraine. May repeat in 2 hours if headache persists or recurs.  . Topiramate ER (TROKENDI XR) 50 MG CP24 Take 50 mg by mouth at bedtime.   Allergies  Allergen Reactions  . Lidocaine Anaphylaxis  . Lidocaine-Epinephrine Anaphylaxis  . Latex Rash    rash  . Tape Rash   No results found for this or any previous visit (from the past 2160 hour(s)). Objective  Body mass index is 31.05 kg/m. Wt Readings from Last 3 Encounters:  05/23/18 186 lb 9.6 oz (84.6 kg)  05/17/18 187 lb 12.8 oz (85.2 kg)  03/28/18 187 lb (84.8 kg)   Temp Readings from Last 3 Encounters:  05/23/18 97.6 F (36.4 C) (Oral)  05/17/18 98.4 F (36.9 C) (Oral)  01/21/18 98.3 F (36.8 C) (Oral)   BP Readings from Last 3 Encounters:  05/23/18 110/78  05/17/18 116/62  03/28/18 100/60   Pulse Readings from Last 3 Encounters:  05/23/18 91  05/17/18 80  03/28/18 80    Physical  Exam Vitals signs and nursing note reviewed.  Constitutional:      Appearance: Normal appearance.  HENT:     Head: Normocephalic and atraumatic.     Nose: Nose  normal.     Mouth/Throat:     Mouth: Mucous membranes are moist.     Pharynx: Oropharynx is clear.  Eyes:     Pupils: Pupils are equal, round, and reactive to light.  Cardiovascular:     Rate and Rhythm: Normal rate and regular rhythm.     Heart sounds: Normal heart sounds.  Pulmonary:     Effort: Pulmonary effort is normal.     Breath sounds: Normal breath sounds. No wheezing.  Neurological:     General: No focal deficit present.     Mental Status: She is alert and oriented to person, place, and time. Mental status is at baseline.     Gait: Gait normal.  Psychiatric:        Attention and Perception: Attention and perception normal.        Mood and Affect: Mood and affect normal.        Speech: Speech normal.        Behavior: Behavior normal. Behavior is cooperative.        Thought Content: Thought content normal.        Cognition and Memory: Cognition and memory normal.        Judgment: Judgment normal.     Assessment   1. Migraines uncontrolled  2. URI with cough  3. Constipation and diarrhea could be IBS mixed  4. HM Plan   1. Sent msg Dr. Jaynee Eagles consider injections monthly meds not working  Add mag 400-500 mg qd and vitamin B2 200 mg bid  F/u neurology  2. zpack if not better call back cheratussin and delsym prn cough  3.  linzess did not help  Disc mag, prune juice  Given fodmap diet  4.  Never had flu shot  ? Date HPV Tdap utd  2/3 hep B vaccines 3rd due 05/2018  STD check 03/2017 [facility] neg f/u with Dr. Leafy Ro  -pap 04/22/18 negative no HPV comment [facility]  Dentist Renato Battles Dr. Gabriel Carina notes reviewed 11/05/17 for Graves dx'ed 2005 tx'ed methimazole until 12/2013 TSH and T4 remained normal labs 2016, 2017, 2019 labs. Graves in remission though labs checked f/u based on labs  Provider: Dr.  [name] McLean-Scocuzza-Internal Medicine

## 2018-05-23 NOTE — Patient Instructions (Addendum)
Desylm for cough  And Cherritussin Increase water intake, fiber supplements otc (gummies), warm prune juice   Magnesium 400-500 mg daily supplement Vitamin B2 200 mg 2x per day    Low-FODMAP Eating Plan  FODMAPs (fermentable oligosaccharides, disaccharides, monosaccharides, and polyols) are sugars that are hard for some people to digest. A low-FODMAP eating plan may help some people who have bowel (intestinal) diseases to manage their symptoms. This meal plan can be complicated to follow. Work with a diet and nutrition specialist (dietitian) to make a low-FODMAP eating plan that is right for you. A dietitian can make sure that you get enough nutrition from this diet. What are tips for following this plan? Reading food labels  Check labels for hidden FODMAPs such as: ? High-fructose syrup. ? Honey. ? Agave. ? Natural fruit flavors. ? Onion or garlic powder.  Choose low-FODMAP foods that contain 3-4 grams of fiber per serving.  Check food labels for serving sizes. Eat only one serving at a time to make sure FODMAP levels stay low. Meal planning  Follow a low-FODMAP eating plan for up to 6 weeks, or as told by your health care provider or dietitian.  To follow the eating plan: 1. Eliminate high-FODMAP foods from your diet completely. 2. Gradually reintroduce high-FODMAP foods into your diet one at a time. Most people should wait a few days after introducing one high-FODMAP food before they introduce the next high-FODMAP food. Your dietitian can recommend how quickly you may reintroduce foods. 3. Keep a daily record of what you eat and drink, and make note of any symptoms that you have after eating. 4. Review your daily record with a dietitian regularly. Your dietitian can help you identify which foods you can eat and which foods you should avoid. General tips  Drink enough fluid each day to keep your urine pale yellow.  Avoid processed foods. These often have added sugar and may be  high in FODMAPs.  Avoid most dairy products, whole grains, and sweeteners.  Work with a dietitian to make sure you get enough fiber in your diet. Recommended foods Grains  Gluten-free grains, such as rice, oats, buckwheat, quinoa, corn, polenta, and millet. Gluten-free pasta, bread, or cereal. Rice noodles. Corn tortillas. Vegetables  Eggplant, zucchini, cucumber, peppers, green beans, Brussels sprouts, bean sprouts, lettuce, arugula, kale, Swiss chard, spinach, collard greens, bok choy, summer squash, potato, and tomato. Limited amounts of corn, carrot, and sweet potato. Green parts of scallions. Fruits  Bananas, oranges, lemons, limes, blueberries, raspberries, strawberries, grapes, cantaloupe, honeydew melon, kiwi, papaya, passion fruit, and pineapple. Limited amounts of dried cranberries, banana chips, and shredded coconut. Dairy  Lactose-free milk, yogurt, and kefir. Lactose-free cottage cheese and ice cream. Non-dairy milks, such as almond, coconut, hemp, and rice milk. Yogurts made of non-dairy milks. Limited amounts of goat cheese, brie, mozzarella, parmesan, swiss, and other hard cheeses. Meats and other protein foods  Unseasoned beef, pork, poultry, or fish. Eggs. Berniece Salines. Tofu (firm) and tempeh. Limited amounts of nuts and seeds, such as almonds, walnuts, [place] nuts, pecans, peanuts, pumpkin seeds, chia seeds, and sunflower seeds. Fats and oils  Butter-free spreads. Vegetable oils, such as olive, canola, and sunflower oil. Seasoning and other foods  Artificial sweeteners with names that do not end in "ol" such as aspartame, saccharine, and stevia. Maple syrup, white table sugar, raw sugar, brown sugar, and molasses. Fresh basil, coriander, parsley, rosemary, and thyme. Beverages  Water and mineral water. Sugar-sweetened soft drinks. Small amounts of orange juice or  cranberry juice. Black and green tea. Most dry wines. Coffee. This may not be a complete list of low-FODMAP  foods. Talk with your dietitian for more information. Foods to avoid Grains  Wheat, including kamut, durum, and semolina. Barley and bulgur. Couscous. Wheat-based cereals. Wheat noodles, bread, crackers, and pastries. Vegetables  Chicory root, artichoke, asparagus, cabbage, snow peas, sugar snap peas, mushrooms, and cauliflower. Onions, garlic, leeks, and the white part of scallions. Fruits  Fresh, dried, and juiced forms of apple, pear, watermelon, peach, plum, cherries, apricots, blackberries, boysenberries, figs, nectarines, and mango. Avocado. Dairy  Milk, yogurt, ice cream, and soft cheese. Cream and sour cream. Milk-based sauces. Custard. Meats and other protein foods  Fried or fatty meat. Sausage. Cashews and pistachios. Soybeans, baked beans, black beans, chickpeas, kidney beans, fava beans, navy beans, lentils, and split peas. Seasoning and other foods  Any sugar-free gum or candy. Foods that contain artificial sweeteners such as sorbitol, mannitol, isomalt, or xylitol. Foods that contain honey, high-fructose corn syrup, or agave. Bouillon, vegetable stock, beef stock, and chicken stock. Garlic and onion powder. Condiments made with onion, such as hummus, chutney, pickles, relish, salad dressing, and salsa. Tomato paste. Beverages  Chicory-based drinks. Coffee substitutes. Chamomile tea. Fennel tea. Sweet or fortified wines such as port or sherry. Diet soft drinks made with isomalt, mannitol, maltitol, sorbitol, or xylitol. Apple, pear, and mango juice. Juices with high-fructose corn syrup. This may not be a complete list of high-FODMAP foods. Talk with your dietitian to discuss what dietary choices are best for you.  Summary  A low-FODMAP eating plan is a short-term diet that eliminates FODMAPs from your diet to help ease symptoms of certain bowel diseases.  The eating plan usually lasts up to 6 weeks. After that, high-FODMAP foods are restarted gradually, one at a time, so you  can find out which may be causing symptoms.  A low-FODMAP eating plan can be complicated. It is best to work with a dietitian who has experience with this type of plan. This information is not intended to replace advice given to you by your health care provider. Make sure you discuss any questions you have with your health care provider. Document Released: 01/09/2017 Document Revised: 01/09/2017 Document Reviewed: 01/09/2017 Elsevier Interactive Patient Education  2019 [place].  Diet for Irritable Bowel Syndrome When you have irritable bowel syndrome (IBS), it is very important to eat the foods and follow the eating habits that are best for your condition. IBS may cause various symptoms such as pain in the abdomen, constipation, or diarrhea. Choosing the right foods can help to ease the discomfort from these symptoms. Work with your health care provider and diet and nutrition specialist (dietitian) to find the eating plan that will help to control your symptoms. What are tips for following this plan?      Keep a food diary. This will help you identify foods that cause symptoms. Write down: ? What you eat and when you eat it. ? What symptoms you have. ? When symptoms occur in relation to your meals, such as "pain in abdomen 2 hours after dinner."  Eat your meals slowly and in a relaxed setting.  Aim to eat 5-6 small meals per day. Do not skip meals.  Drink enough fluid to keep your urine pale yellow.  Ask your health care provider if you should take an over-the-counter probiotic to help restore healthy bacteria in your gut (digestive tract). ? Probiotics are foods that contain good bacteria and yeasts.  Your dietitian may have specific dietary recommendations for you based on your symptoms. He or she may recommend that you: ? Avoid foods that cause symptoms. Talk with your dietitian about other ways to get the same nutrients that are in those problem foods. ? Avoid foods with  gluten. Gluten is a protein that is found in rye, wheat, and barley. ? Eat more foods that contain soluble fiber. Examples of foods with high soluble fiber include oats, seeds, and certain fruits and vegetables. Take a fiber supplement if directed by your dietitian. ? Reduce or avoid certain foods called FODMAPs. These are foods that contain carbohydrates that are hard to digest. Ask your doctor which foods contain these carbohydrates. What foods are not recommended? The following are some foods and drinks that may make your symptoms worse:  Fatty foods, such as french fries.  Foods that contain gluten, such as pasta and cereal.  Dairy products, such as milk, cheese, and ice cream.  Chocolate.  Alcohol.  Products with caffeine, such as coffee.  Carbonated drinks, such as soda.  Foods that are high in FODMAPs. These include certain fruits and vegetables.  Products with sweeteners such as honey, high fructose corn syrup, sorbitol, and mannitol. The items listed above may not be a complete list of foods and beverages you should avoid. Contact a dietitian for more information. What foods are good sources of fiber? Your health care provider or dietitian may recommend that you eat more foods that contain fiber. Fiber can help to reduce constipation and other IBS symptoms. Add foods with fiber to your diet a little at a time so your body can get used to them. Too much fiber at one time might cause gas and swelling of your abdomen. The following are some foods that are good sources of fiber:  Berries, such as raspberries, strawberries, and blueberries.  Tomatoes.  Carrots.  Brown rice.  Oats.  Seeds, such as chia and pumpkin seeds. The items listed above may not be a complete list of recommended sources of fiber. Contact your dietitian for more options. Where to find more information  International Foundation for Functional Gastrointestinal Disorders: www.iffgd.[employer] Diabetes and Digestive and Kidney Diseases: [employer] Summary  When you have irritable bowel syndrome (IBS), it is very important to eat the foods and follow the eating habits that are best for your condition.  IBS may cause various symptoms such as pain in the abdomen, constipation, or diarrhea.  Choosing the right foods can help to ease the discomfort that comes from symptoms.  Keep a food diary. This will help you identify foods that cause symptoms.  Your health care provider or diet and nutrition specialist (dietitian) may recommend that you eat more foods that contain fiber. This information is not intended to replace advice given to you by your health care provider. Make sure you discuss any questions you have with your health care provider. Document Released: 08/05/2003 Document Revised: 12/10/2017 Document Reviewed: 01/16/2017 Elsevier Interactive Patient Education  2019 Else[employer] Inc.   Cough, Adult  Coughing is a reflex that clears your throat and your airways. Coughing helps to heal and protect your lungs. It is normal to cough occasionally, but a cough that happens with other symptoms or lasts a long time may be a sign of a condition that needs treatment. A cough may last only 2-3 weeks (acute), or it may last longer than 8 weeks (chronic). What are the causes? Coughing is commonly caused by:  Breathing in substances that irritate your lungs.  A viral or bacterial respiratory infection.  Allergies.  Asthma.  Postnasal drip.  Smoking.  Acid backing up from the stomach into the esophagus (gastroesophageal reflux).  Certain medicines.  Chronic lung problems, including COPD (or rarely, lung cancer).  Other medical conditions such as heart failure. Follow these instructions at home: Pay attention to any changes in your symptoms. Take these actions to help with your discomfort:  Take medicines only as told by your health care provider. ? If you  were prescribed an antibiotic medicine, take it as told by your health care provider. Do not stop taking the antibiotic even if you start to feel better. ? Talk with your health care provider before you take a cough suppressant medicine.  Drink enough fluid to keep your urine clear or pale yellow.  If the air is dry, use a cold steam vaporizer or humidifier in your bedroom or your home to help loosen secretions.  Avoid anything that causes you to cough at work or at home.  If your cough is worse at night, try sleeping in a semi-upright position.  Avoid cigarette smoke. If you smoke, quit smoking. If you need help quitting, ask your health care provider.  Avoid caffeine.  Avoid alcohol.  Rest as needed. Contact a health care provider if:  You have new symptoms.  You cough up pus.  Your cough does not get better after 2-3 weeks, or your cough gets worse.  You cannot control your cough with suppressant medicines and you are losing sleep.  You develop pain that is getting worse or pain that is not controlled with pain medicines.  You have a fever.  You have unexplained weight loss.  You have night sweats. Get help right away if:  You cough up blood.  You have difficulty breathing.  Your heartbeat is very fast. This information is not intended to replace advice given to you by your health care provider. Make sure you discuss any questions you have with your health care provider. Document Released: 11/11/2010 Document Revised: 10/21/2015 Document Reviewed: 07/22/2014 Elsevier Interactive Patient Education  2019 [employer].

## 2018-05-27 ENCOUNTER — Telehealth: Payer: Self-pay | Admitting: *Deleted

## 2018-05-27 MED ORDER — TOPIRAMATE ER 100 MG PO CAP24: 100.0000 mg | ORAL_CAPSULE | Freq: Every day | ORAL | 11 refills | Status: DC

## 2018-05-27 NOTE — Telephone Encounter (Signed)
Called pt and discussed that the Topiramate  may work better if we increase the dose however due to the kind of medication and risk for s/e we start low and increase over time. Pt was very understanding of this and agreed to increase her dose to 100 mg daily at bedtime to see if this helps her migraines. Advised pt that RN will send in a prescription for the 100 mg strength however until she picks it up pt can take two 50 mg tabs nightly. Plan to see pt for f/u as scheduled in February. Pt verbalized understanding and appreciation.

## 2018-05-27 NOTE — Telephone Encounter (Signed)
-----   Message from Melvenia Beam, MD sent at 05/27/2018 11:29 AM EST ----- We should probably increase the topiramate will you call and explain why we start low and that topiramate may work at a higher dose, we can go up to 100mg  at bedtime. Thanks ----- Message ----- From: McLean-Scocuzza, [name], MD Sent: 05/23/2018   9:01 AM EST To: Melvenia Beam, MD  Pt states Topiramate ER 50 mg is not helping and MALT disenergrating tablet only helps sometimes may be interested in injections

## 2018-06-03 ENCOUNTER — Telehealth: Payer: Self-pay

## 2018-06-03 NOTE — Telephone Encounter (Signed)
Is this FYI? I dont think I need messages like this unless there is a question  [place]

## 2018-06-03 NOTE — Telephone Encounter (Signed)
Nurse visit for HEP B.

## 2018-06-03 NOTE — Telephone Encounter (Signed)
Copied from [place] [id number]. Topic: General - Other >> Jun 03, 2018  4:08 PM Ivar Drape wrote: Reason for CRM:   Kerin Salen said it would be ok to reschedule the patient's Thursday, 06/06/18 appt at 10:30am to Friday, 06/07/18 at 9:00am

## 2018-06-04 NOTE — Telephone Encounter (Signed)
FYI That is all

## 2018-06-06 ENCOUNTER — Ambulatory Visit: Payer: Self-pay

## 2018-06-07 ENCOUNTER — Ambulatory Visit (INDEPENDENT_AMBULATORY_CARE_PROVIDER_SITE_OTHER): Payer: Managed Care, Other (non HMO) | Admitting: *Deleted

## 2018-06-07 ENCOUNTER — Encounter: Payer: Self-pay | Admitting: Internal Medicine

## 2018-06-07 VITALS — BP 102/60 | HR 64 | Temp 98.0°F | Resp 20

## 2018-06-07 DIAGNOSIS — G43009 Migraine without aura, not intractable, without status migrainosus: Secondary | ICD-10-CM | POA: Diagnosis not present

## 2018-06-07 DIAGNOSIS — Z23 Encounter for immunization: Secondary | ICD-10-CM

## 2018-06-07 MED ORDER — KETOROLAC TROMETHAMINE 60 MG/2ML IM SOLN
30.0000 mg | Freq: Once | INTRAMUSCULAR | Status: AC
  Administered 2018-06-07: 30 mg via INTRAMUSCULAR

## 2018-06-07 MED ORDER — METHYLPREDNISOLONE ACETATE 40 MG/ML IJ SUSP
40.0000 mg | Freq: Once | INTRAMUSCULAR | Status: AC
  Administered 2018-06-07: 40 mg via INTRAMUSCULAR

## 2018-06-07 MED ORDER — ONDANSETRON HCL 4 MG PO TABS
4.0000 mg | ORAL_TABLET | Freq: Once | ORAL | Status: AC
  Administered 2018-06-07: 4 mg via ORAL

## 2018-06-07 NOTE — Progress Notes (Signed)
Patient came into office with c/o headache migraine with pain rated at 9 on scale 0-10 verbal.  Duration 5 days, patient had tried all home medications with no relief. Patient stated she cannot get appointment with neurology until  07/18/18.  Advised PCP was given verbal for Zofran 0.4 mg and for Toradol 30 mg and depo-medrol 40.

## 2018-06-10 ENCOUNTER — Telehealth: Payer: Self-pay

## 2018-06-10 NOTE — Telephone Encounter (Signed)
Ok to do so? Copied from [place] [id number]. Topic: General - Inquiry >> Jun 10, 2018 10:27 AM Berneta Levins wrote: Reason for CRM:   Pt's mother, Arrie Aran, calling in.  States that pt missed work on Saturday due to migraine and shots she was given on Friday in office.  Pt is requesting a work note for Friday and Saturday. Pt can be reached at [phone]

## 2018-06-12 ENCOUNTER — Encounter: Payer: Self-pay | Admitting: Internal Medicine

## 2018-06-12 NOTE — Telephone Encounter (Signed)
mychart message has been sent

## 2018-06-12 NOTE — Telephone Encounter (Signed)
Note ready for pick up   [place]

## 2018-07-04 ENCOUNTER — Ambulatory Visit: Payer: Managed Care, Other (non HMO) | Admitting: Neurology

## 2018-07-22 ENCOUNTER — Ambulatory Visit: Payer: Self-pay | Admitting: *Deleted

## 2018-07-22 NOTE — Telephone Encounter (Signed)
Severe abdominal pain, patient states it is a dull pain and then spasm to a hard pain that she cannot walk or move advised patient with pain this severe she needs to be evaluated now in the ER. Patient says that she has a slight bloody discharge , last menstrual completed on 07/17/18 this started today, ask patient was she sexually active she said not really but would not explain not actually , advised patient if she had unprotected sex before really being on the birth control and she had missed a dose she should definitely be evaluated now, patient stated she would have her mother take her. FYI docotor in building.

## 2018-07-22 NOTE — Telephone Encounter (Signed)
FYI

## 2018-07-22 NOTE — Telephone Encounter (Signed)
Just started 4th pack of birthcontrol pills yesterday with period ended on 2/19. Woke up this morning and wiping bloody discharge from vagina.Shortly after, she started having lower pelvic cramping. No clots/string/fever. Feels hesitancy with voiding today. She then voids and her bladder feels empty at the time. Has voided twice today. LBM today and normal.Does report nausea with this. Denies possibility of pregnancy at this time. Her mother is with her on the phone. Appointment was made for tomorrow morning when she called earlier today. No PCP availability today. Stated UC refused her for treatment due to the pain factor.  Offered ER or other UC as alternative. Offered advice care included pain management with tylenol or advil and OTC Azo. If no relief or if fever occurs, seek treatment immediately at [facility] or ER. Stated she understood.   Reason for Disposition . [1] Mild lower abdominal pain comes and goes (cramps) AND [2] lasts > 24 hours  Answer Assessment - Initial Assessment Questions 1. AMOUNT: "Describe the bleeding that you are having."    - SPOTTING: spotting, or pinkish / brownish mucous discharge; does not fill panti-liner or pad    - MILD:  less than 1 pad / hour; less than patient's usual menstrual bleeding   - MODERATE: 1-2 pads / hour; 1 menstrual cup every 6 hours; small-medium blood clots (e.g., pea, grape, small coin)   - SEVERE: soaking 2 or more pads/hour for 2 or more hours; 1 menstrual cup every 2 hours; bleeding not contained by pads or continuous red blood from vagina; large blood clots (e.g., golf ball, large coin)      Vaginal discharge with bright red bleeding. 2. ONSET: "When did the bleeding begin?" "Is it continuing now?"     Started this morning about 11:30a 3. MENSTRUAL PERIOD: "When was the last normal menstrual period?" "How is this different than your period?"     Last Wednesday. 4. REGULARITY: "How regular are your periods?"     yes 5. ABDOMINAL PAIN: "Do you have  any pain?" "How bad is the pain?"  (e.g., Scale 1-10; mild, moderate, or severe)   - MILD (1-3): doesn't interfere with normal activities, abdomen soft and not tender to touch    - MODERATE (4-7): interferes with normal activities or awakens from sleep, tender to touch    - SEVERE (8-10): excruciating pain, doubled over, unable to do any normal activities     8 6. PREGNANCY: "Could you be pregnant?" "Are you sexually active?" "Did you recently give birth?"     Is not pregnant she reports. 7. BREASTFEEDING: "Are you breastfeeding?"    no 8. HORMONES: "Are you taking any hormone medications, prescription or OTC?" (e.g., birth control pills, estrogen)    Birth control pills. Started pills last night as period ended last wednesday 9. BLOOD THINNERS: "Do you take any blood thinners?" (e.g., Coumadin/warfarin, Pradaxa/dabigatran, aspirin)    no 10. CAUSE: "What do you think is causing the bleeding?" (e.g., recent gyn surgery, recent gyn procedure; known bleeding disorder, cervical cancer, polycystic ovarian disease, fibroids)         None of these. 11. HEMODYNAMIC STATUS: "Are you weak or feeling lightheaded?" If so, ask: "Can you stand and walk normally?"       no 12. OTHER SYMPTOMS: "What other symptoms are you having with the bleeding?" (e.g., passed tissue, vaginal discharge, fever, menstrual-type cramps)       Constant lower abdominal pain goes between dull and sharp. Menstrual type cramps  Answer Assessment -  Initial Assessment Questions 1. DISCHARGE: "Describe the discharge." (e.g., white, yellow, green, gray, foamy, cottage cheese-like)     Blood tinged on the toilet paper.  2. ODOR: "Is there a bad odor?"    no 3. ONSET: "When did the discharge begin?"     This morning at 11:30a 4. RASH: "Is there a rash in that area?" If so, ask: "Describe it." (e.g., redness, blisters, sores, bumps)     Not that she is aware of  5. ABDOMINAL PAIN: "Are you having any abdominal pain?" If yes: "What  does it feel like? " (e.g., crampy, dull, intermittent, constant)     crampy intermittent 6. ABDOMINAL PAIN SEVERITY: If present, ask: "How bad is it?"  (e.g., mild, moderate, severe)  - MILD - doesn't interfere with normal activities   - MODERATE - interferes with normal activities or awakens from sleep   - SEVERE - patient doesn't want to move (R/O peritonitis)      Rates 8. 7. CAUSE: "What do you think is causing the discharge?" "Have you had the same problem before? What happened then?"     Does not know. Never happened before 8. OTHER SYMPTOMS: "Do you have any other symptoms?" (e.g., fever, itching, vaginal bleeding, pain with urination, injury to genital area, vaginal foreign body)     hesitancy with voiding. 9. PREGNANCY: "Is there any chance you are pregnant?" "When was your last menstrual period?"     Ended last Wednesday. Stated no chance she is pregnant.  Protocols used: VAGINAL DISCHARGE-A-AH, VAGINAL BLEEDING - ABNORMAL-A-AH

## 2018-07-22 NOTE — Telephone Encounter (Signed)
Reviewed  TMS 

## 2018-07-23 ENCOUNTER — Ambulatory Visit: Payer: Self-pay | Admitting: Internal Medicine

## 2018-07-24 ENCOUNTER — Ambulatory Visit: Payer: Managed Care, Other (non HMO) | Admitting: Neurology

## 2018-07-24 ENCOUNTER — Encounter: Payer: Self-pay | Admitting: Neurology

## 2018-07-24 VITALS — BP 100/64 | HR 64 | Ht 65.0 in | Wt 189.0 lb

## 2018-07-24 DIAGNOSIS — G43709 Chronic migraine without aura, not intractable, without status migrainosus: Secondary | ICD-10-CM

## 2018-07-24 MED ORDER — ERENUMAB-AOOE 140 MG/ML ~~LOC~~ SOAJ: 140.0000 mg | SUBCUTANEOUS | 11 refills | Status: DC

## 2018-07-24 NOTE — Patient Instructions (Signed)
Start Aimovig once monthly  Erenumab injection What is this medicine? ERENUMAB (e REN ue mab) is used to prevent migraine headaches. This medicine may be used for other purposes; ask your health care provider or pharmacist if you have questions. COMMON BRAND NAME(S): Aimovig What should I tell my health care provider before I take this medicine? They need to know if you have any of these conditions: -an unusual or allergic reaction to erenumab, latex, other medicines, foods, dyes, or preservatives -pregnant or trying to get pregnant -breast-feeding How should I use this medicine? This medicine is for injection under the skin. You will be taught how to prepare and give this medicine. Use exactly as directed. Take your medicine at regular intervals. Do not take your medicine more often than directed. It is important that you put your used needles and syringes in a special sharps container. Do not put them in a trash can. If you do not have a sharps container, call your pharmacist or healthcare provider to get one. Talk to your pediatrician regarding the use of this medicine in children. Special care may be needed. Overdosage: If you think you have taken too much of this medicine contact a poison control center or emergency room at once. NOTE: This medicine is only for you. Do not share this medicine with others. What if I miss a dose? If you miss a dose, take it as soon as you can. If it is almost time for your next dose, take only that dose. Do not take double or extra doses. What may interact with this medicine? Interactions are not expected. This list may not describe all possible interactions. Give your health care provider a list of all the medicines, herbs, non-prescription drugs, or dietary supplements you use. Also tell them if you smoke, drink alcohol, or use illegal drugs. Some items may interact with your medicine. What should I watch for while using this medicine? Tell your doctor  or healthcare professional if your symptoms do not start to get better or if they get worse. What side effects may I notice from receiving this medicine? Side effects that you should report to your doctor or health care professional as soon as possible: -allergic reactions like skin rash, itching or hives, swelling of the face, lips, or tongue Side effects that usually do not require medical attention (report these to your doctor or health care professional if they continue or are bothersome): -constipation -muscle cramps -pain, redness, or irritation at site where injected This list may not describe all possible side effects. Call your doctor for medical advice about side effects. You may report side effects to FDA at 1-800-FDA-1088. Where should I keep my medicine? Keep out of the reach of children. You will be instructed on how to store this medicine. Throw away any unused medicine after the expiration date on the label. NOTE: This sheet is a summary. It may not cover all possible information. If you have questions about this medicine, talk to your doctor, pharmacist, or health care provider.  2019 Elsevier/Gold Standard (2016-10-16 09:39:04)

## 2018-07-24 NOTE — Progress Notes (Signed)
[facility]    Provider:  Dr Jaynee Eagles Referring Provider: McLean-Scocuzza, [name] * Primary Care Physician:  McLean-Scocuzza, [name], MD  CC:  Migraine  Interval history 07/24/2018: Patient is here for follow-up of migraines.  She is failed multiple medication classes.  I last appointment we started topiramate and increased it and this did not work.  Now patient's failed at least 4 classes of medications.  She is on birth control and is not planning on getting pregnant.  Discussed her options which include another class of medications such as a beta-blocker which she may not tolerate due to low blood pressure or other oral daily medications, we discussed Botox for migraine, we also discussed the new CGRP monthly injections.  She would like to try Aimovig.  Discussed side effects with her.  We tried her on acute management which did not work per patient but I have asked her to try it again after starting Aimovig, often after patient start Aimovig any residual migraines are less severe and easier to treat with acute management.  If the Aimovig works but her acute management is still not effective then we can consider changing that.  Discussed with patient do not want her to get pregnant, she needs to be off the [place] for 5 to 6 months before becoming pregnant.  Reviewed MRI of the brain images and agree with the following: This MRI of the brain with and without contrast shows the following: 1.    Single punctate FLAIR hyperintense focus in the subcortical white matter of the posterior right frontal lobe not noted on other sequences.  This is unlikely to be clinically significant. 2.    There are no acute findings and there is a normal enhancement pattern.   HPI:  Ruth Tran is a 28 y.o. female here as requested by Dr. Terese Door for migraine.PMHx migraine, panic attack, graves disease. Migraines started in 2017 when she started college at [employer]. Sumatriptan  worked for a while. Light and sound trigger. Progressively getting worse. 20 headache days. 10 migraine days a month and those can be moderately severe to severe. She has been having blurry vision and diplopia. Unilateral and spreads, worse positionally, fluid in the head like underwater, pounding/throbbing/pulsating, +photo/phono/osmophobia. Feeling of fluid in the head. + nausea. +vomiting. She can wake with the migraines and headaches. She has vision changes, difficulty with seeing, blurry maybe double. Can last 24-72 hours. Mother and MGM with migraines. No aura. May or may not have an aura of blurry vision. No dizziness, no known triggers except smell and light. No other focal neurologic deficits, associated symptoms, inciting events or modifiable factors.   Tried: Imitrex, nortriptyline, celexa, Topiramate, topiramate  Reviewed notes, labs and imaging from outside physicians, which showed:  Reviewed notes from McLean-Scocuzza, [name], MD. patient has a history of migraines.  Was last seen in August by referring physician.  She had one headache in August 2019 early in the morning which was severe 7 out of 10 bad headache at night tried Imitrex doses did not help and for Advil with cold washcloth getting in the hot shower and went to sleep with relief next day.  She is tried Imitrex, nortriptyline, NSAIDs not helping.  She has severe constipation on Linzess.  Prior appointments with referring physician she also discussed migraines at work, the son triggers the migraines, Imitrex and NSAIDs for acute management, migraines not triggered by cycles, she has nausea with the headaches.   CT head showed  No acute intracranial abnormalities including mass lesion or mass effect, hydrocephalus, extra-axial fluid collection, midline shift, hemorrhage, or acute infarction, large ischemic events (personally reviewed images)     Review of Systems: Patient complains of symptoms per HPI as well as the following  symptoms: headache, double vision, blurred vision, joint pain, nausea. Pertinent negatives and positives per HPI. All others negative.   Social History   Socioeconomic History  . Marital status: Single    Spouse name: Not on file  . Number of children: 0  . Years of education: Not on file  . Highest education level: Bachelor's degree (e.g., BA, AB, BS)  Occupational History  . Not on file  Social Needs  . Financial resource strain: Not on file  . Food insecurity:    Worry: Not on file    Inability: Not on file  . Transportation needs:    Medical: Not on file    Non-medical: Not on file  Tobacco Use  . Smoking status: Never Smoker  . Smokeless tobacco: Never Used  Substance and Sexual Activity  . Alcohol use: No    Alcohol/week: 0.0 standard drinks  . Drug use: No  . Sexual activity: Never    Birth control/protection: None  Lifestyle  . Physical activity:    Days per week: Not on file    Minutes per session: Not on file  . Stress: Not on file  Relationships  . Social connections:    Talks on phone: Not on file    Gets together: Not on file    Attends religious service: Not on file    Active member of club or organization: Not on file    Attends meetings of clubs or organizations: Not on file    Relationship status: Not on file  . Intimate partner violence:    Fear of current or ex partner: Not on file    Emotionally abused: Not on file    Physically abused: Not on file    Forced sexual activity: Not on file  Other Topics Concern  . Not on file  Social History Narrative   Works [employer]    BA Degree    Wears seat belt,    Guns at home    Safe in relationship    Does not exercise    Caffeine: cup of soda or tea daily    Family History  Problem Relation Age of Onset  . Graves' disease Other   . Diabetes Father   . Hyperlipidemia Father   . Graves' disease Maternal Grandfather   . Miscarriages / Stillbirths Maternal Grandfather   . Cancer Maternal  Grandfather        prostate/bladder   . Ovarian cancer Paternal Grandmother   . Cancer Paternal Grandmother        brain  . Hypertension Mother   . Migraines Mother   . Learning disabilities Sister   . Cancer Maternal Grandmother        lung (small cell) >liver>pancreas smoker   . Migraines Maternal Grandmother   . Cancer Paternal Grandfather        skin cancer   . Breast cancer Neg Hx   . Colon cancer Neg Hx   . Heart disease Neg Hx     Past Medical History:  Diagnosis Date  . Allergy   . Chicken pox   . Complication of anesthesia    WITH HER WISDOM TEETH SURGERY SHE HAD TO GO TO THE OR TO HAVE THIS  DONE DUE TO TACHYCARDIA DUE TO GRAVES DISEASE  . Complication of anesthesia   . Dysmenorrhea   . Family history of endometriosis    mother,sister  . Graves disease    graves, hyperthyroidism   . Headache    MIGRAINES  . Migraine   . Ovarian cyst   . Panic attack   . UTI (urinary tract infection)     Past Surgical History:  Procedure Laterality Date  . APPENDECTOMY  2005   WITH DRAIN PLACED DUE TO ABSCESS 2005 [facility]  . BREAST BIOPSY Right 2017   had a reaction to xylo or xylo with epi where her throat closed up  . FOOT SURGERY Bilateral 2008   toes going outward surgery to turn toes inward 2010   . TONSILLECTOMY AND ADENOIDECTOMY N/A 04/17/2017   Procedure: TONSILLECTOMY AND POSSIBLE  ADENOIDECTOMY;  Surgeon: Beverly Gust, MD;  Location: [facility] ORS;  Service: ENT;  Laterality: N/A;  . WISDOM TOOTH EXTRACTION     2016    Current Outpatient Medications  Medication Sig Dispense Refill  . cetirizine (ZYRTEC) 10 MG tablet Take 10 mg by mouth daily.    . montelukast (SINGULAIR) 10 MG tablet Take 1 tablet (10 mg total) by mouth at bedtime. 90 tablet 3  . norgestimate-ethinyl estradiol (ORTHO-CYCLEN,SPRINTEC,PREVIFEM) 0.25-35 MG-MCG tablet Take 1 tablet by mouth daily.    Eduard Roux (AIMOVIG) 140 MG/ML SOAJ Inject 140 mg into the skin every 30 (thirty) days. 1 pen 11    . nortriptyline (PAMELOR) 25 MG capsule Take 1 capsule (25 mg total) by mouth at bedtime. (Patient not taking: Reported on 07/24/2018) 30 capsule 2  . ondansetron (ZOFRAN-ODT) 4 MG disintegrating tablet Take 1 tablet (4 mg total) by mouth every 8 (eight) hours as needed for nausea. (Patient not taking: Reported on 07/24/2018) 30 tablet 3  . rizatriptan (MAXALT-MLT) 10 MG disintegrating tablet Take 1 tablet (10 mg total) by mouth as needed for migraine. May repeat in 2 hours if needed. Max twice in one day. (Patient not taking: Reported on 07/24/2018) 9 tablet 11  . SUMAtriptan (IMITREX) 100 MG tablet Take 100 mg by mouth every 2 (two) hours as needed for migraine. May repeat in 2 hours if headache persists or recurs.     No current facility-administered medications for this visit.     Allergies as of 07/24/2018 - Review Complete 07/24/2018  Allergen Reaction Noted  . Lidocaine Anaphylaxis 04/10/2017  . Lidocaine-epinephrine Anaphylaxis 04/10/2017  . Toradol [ketorolac tromethamine] Anaphylaxis 07/24/2018  . Latex Rash 01/22/2014  . Linzess [linaclotide]  05/23/2018  . Tape Rash 01/22/2014    Vitals: BP 100/64 (BP Location: Right Arm, Patient Position: Sitting)   Pulse 64   Ht 5\' 5"  (1.651 m)   Wt 189 lb (85.7 kg)   BMI 31.45 kg/m  Last Weight:  Wt Readings from Last 1 Encounters:  07/24/18 189 lb (85.7 kg)   Last Height:   Ht Readings from Last 1 Encounters:  07/24/18 5\' 5"  (1.651 m)    Physical exam: Exam: Gen: NAD, conversant, well nourised, obese, well groomed                     CV: RRR, no MRG. No Carotid Bruits. No peripheral edema, warm, nontender Eyes: Conjunctivae clear without exudates or hemorrhage  Neuro: Detailed Neurologic Exam  Speech:    Speech is normal; fluent and spontaneous with normal comprehension.  Cognition:    The patient is oriented to person, place, and  time;     recent and remote memory intact;     language fluent;     normal attention,  concentration,     fund of knowledge Cranial Nerves:    The pupils are equal, round, and reactive to light. The fundi are normal and spontaneous venous pulsations are present. Visual fields are full to finger confrontation. Extraocular movements are intact. Trigeminal sensation is intact and the muscles of mastication are normal. The face is symmetric. The palate elevates in the midline. Hearing intact. Voice is normal. Shoulder shrug is normal. The tongue has normal motion without fasciculations.   Coordination:    Normal finger to nose and heel to shin. Normal rapid alternating movements.   Gait:    Heel-toe and tandem gait are normal.   Motor Observation:    No asymmetry, no atrophy, and no involuntary movements noted. Tone:    Normal muscle tone.    Posture:    Posture is normal. normal erect    Strength:    Strength is V/V in the upper and lower limbs.      Sensation: intact to LT     Reflex Exam:  DTR's:    Deep tendon reflexes in the upper and lower extremities are normal bilaterally.   Toes:    The toes are downgoing bilaterally.   Clonus:    Clonus is absent.      Assessment/Plan:  Patient with chronic migraines and failed multiple classes of medications.  MRI brain unremarkable Start Aimovig for migraine prevention Acute: Maxalt and zofran F/u 6 months  Discussed: To prevent or relieve headaches, try the following: Cool Compress. Lie down and place a cool compress on your head.  Avoid headache triggers. If certain foods or odors seem to have triggered your migraines in the past, avoid them. A headache diary might help you identify triggers.  Include physical activity in your daily routine. Try a daily walk or other moderate aerobic exercise.  Manage stress. Find healthy ways to cope with the stressors, such as delegating tasks on your to-do list.  Practice relaxation techniques. Try deep breathing, yoga, massage and visualization.  Eat regularly. Eating  regularly scheduled meals and maintaining a healthy diet might help prevent headaches. Also, drink plenty of fluids.  Follow a regular sleep schedule. Sleep deprivation might contribute to headaches Consider biofeedback. With this mind-body technique, you learn to control certain bodily functions - such as muscle tension, heart rate and blood pressure - to prevent headaches or reduce headache pain.    Proceed to emergency room if you experience new or worsening symptoms or symptoms do not resolve, if you have new neurologic symptoms or if headache is severe, or for any concerning symptom.   Provided education and documentation from American headache Society toolbox including articles on: chronic migraine medication overuse headache, chronic migraines, prevention of migraines, behavioral and other nonpharmacologic treatments for headache.  A total of 25 minutes was spent face-to-face with this patient. Over half this time was spent on counseling patient on the  1. Chronic migraine without aura without status migrainosus, not intractable    diagnosis and different diagnostic and therapeutic options, counseling and coordination of care, risks ans benefits of management, compliance, or risk factor reduction and education.    Cc: Dr. Loetta Rough, [facility] [address] [place], [place] [address]  Phone [phone] Fax [phone]

## 2018-07-26 ENCOUNTER — Other Ambulatory Visit: Payer: Self-pay

## 2018-07-26 ENCOUNTER — Encounter: Payer: Self-pay | Admitting: Internal Medicine

## 2018-07-26 ENCOUNTER — Ambulatory Visit (INDEPENDENT_AMBULATORY_CARE_PROVIDER_SITE_OTHER): Payer: Managed Care, Other (non HMO) | Admitting: Internal Medicine

## 2018-07-26 VITALS — BP 100/58 | HR 64 | Temp 97.5°F | Ht 65.0 in | Wt 189.4 lb

## 2018-07-26 DIAGNOSIS — N83201 Unspecified ovarian cyst, right side: Secondary | ICD-10-CM

## 2018-07-26 DIAGNOSIS — R102 Pelvic and perineal pain: Secondary | ICD-10-CM

## 2018-07-26 MED ORDER — OXYCODONE-ACETAMINOPHEN 5-325 MG PO TABS: 0.5000 | ORAL_TABLET | Freq: Two times a day (BID) | ORAL | 0 refills | Status: DC | PRN

## 2018-07-26 NOTE — Progress Notes (Signed)
Chief Complaint  Patient presents with  . Follow-up    ER F/U   F/u ED [facility] for right lower abdomen pain noted to have ovarian cyst right ovary Still with RLQ ab pain today 7-9/10 tried naproxen 500 mg bid w/o help and heat pt missed work 2/24 and 2/25 and Monday had multiple stools and missed work due to n/v and vaginal bleeding after her cycle ended 1 week prior on Weds. appt with ob/gyn 08/05/2018 but pt will call to try to move appt up. STD check and pregnancy tests were negative. Urine normal negative blood. She is s/p appendectomy       Review of Systems  Constitutional: Negative for weight loss.  HENT: Negative for hearing loss.   Eyes: Negative for blurred vision.  Respiratory: Negative for shortness of breath.   Cardiovascular: Negative for chest pain.  Gastrointestinal: Positive for abdominal pain. Negative for diarrhea, nausea and vomiting.  Genitourinary:       No further vag bleeding   Skin: Negative for rash.  Psychiatric/Behavioral: The patient has insomnia.    Past Medical History:  Diagnosis Date  . Allergy   . Chicken pox   . Complication of anesthesia    WITH HER WISDOM TEETH SURGERY SHE HAD TO GO TO THE OR TO HAVE THIS DONE DUE TO TACHYCARDIA DUE TO GRAVES DISEASE  . Complication of anesthesia   . Dysmenorrhea   . Family history of endometriosis    mother,sister  . Graves disease    graves, hyperthyroidism   . Headache    MIGRAINES  . Migraine   . Ovarian cyst   . Panic attack   . UTI (urinary tract infection)    Past Surgical History:  Procedure Laterality Date  . APPENDECTOMY  2005   WITH DRAIN PLACED DUE TO ABSCESS 2005 [facility]  . BREAST BIOPSY Right 2017   had a reaction to xylo or xylo with epi where her throat closed up  . FOOT SURGERY Bilateral 2008   toes going outward surgery to turn toes inward 2010   . TONSILLECTOMY AND ADENOIDECTOMY N/A 04/17/2017   Procedure: TONSILLECTOMY AND POSSIBLE  ADENOIDECTOMY;  Surgeon: Beverly Gust, MD;  Location: [facility] ORS;  Service: ENT;  Laterality: N/A;  . WISDOM TOOTH EXTRACTION     2016   Family History  Problem Relation Age of Onset  . Graves' disease Other   . Diabetes Father   . Hyperlipidemia Father   . Graves' disease Maternal Grandfather   . Miscarriages / Stillbirths Maternal Grandfather   . Cancer Maternal Grandfather        prostate/bladder   . Ovarian cancer Paternal Grandmother   . Cancer Paternal Grandmother        brain  . Hypertension Mother   . Migraines Mother   . Learning disabilities Sister   . Cancer Maternal Grandmother        lung (small cell) >liver>pancreas smoker   . Migraines Maternal Grandmother   . Cancer Paternal Grandfather        skin cancer   . Breast cancer Neg Hx   . Colon cancer Neg Hx   . Heart disease Neg Hx    Social History   Socioeconomic History  . Marital status: Single    Spouse name: Not on file  . Number of children: 0  . Years of education: Not on file  . Highest education level: Bachelor's degree (e.g., BA, AB, BS)  Occupational History  . Not on  file  Social Needs  . Financial resource strain: Not on file  . Food insecurity:    Worry: Not on file    Inability: Not on file  . Transportation needs:    Medical: Not on file    Non-medical: Not on file  Tobacco Use  . Smoking status: Never Smoker  . Smokeless tobacco: Never Used  Substance and Sexual Activity  . Alcohol use: No    Alcohol/week: 0.0 standard drinks  . Drug use: No  . Sexual activity: Never    Birth control/protection: None  Lifestyle  . Physical activity:    Days per week: Not on file    Minutes per session: Not on file  . Stress: Not on file  Relationships  . Social connections:    Talks on phone: Not on file    Gets together: Not on file    Attends religious service: Not on file    Active member of club or organization: Not on file    Attends meetings of clubs or organizations: Not on file    Relationship status: Not on  file  . Intimate partner violence:    Fear of current or ex partner: Not on file    Emotionally abused: Not on file    Physically abused: Not on file    Forced sexual activity: Not on file  Other Topics Concern  . Not on file  Social History Narrative   Works [employer]    BA Degree    Wears seat belt,    Guns at home    Safe in relationship    Does not exercise    Caffeine: cup of soda or tea daily   Current Meds  Medication Sig  . cetirizine (ZYRTEC) 10 MG tablet Take 10 mg by mouth daily.  Eduard Roux (AIMOVIG) 140 MG/ML SOAJ Inject 140 mg into the skin every 30 (thirty) days.  . montelukast (SINGULAIR) 10 MG tablet Take 1 tablet (10 mg total) by mouth at bedtime.  . naproxen (NAPROSYN) 500 MG tablet Take 500 mg by mouth 2 (two) times daily with a meal.   . norgestimate-ethinyl estradiol (ORTHO-CYCLEN,SPRINTEC,PREVIFEM) 0.25-35 MG-MCG tablet Take 1 tablet by mouth daily.  . norgestimate-ethinyl estradiol (ORTHO-CYCLEN,SPRINTEC,PREVIFEM) 0.25-35 MG-MCG tablet Take by mouth.  . ondansetron (ZOFRAN-ODT) 4 MG disintegrating tablet Take 1 tablet (4 mg total) by mouth every 8 (eight) hours as needed for nausea.  . [DISCONTINUED] ondansetron (ZOFRAN-ODT) 4 MG disintegrating tablet Take by mouth.   Allergies  Allergen Reactions  . Lidocaine Anaphylaxis  . Lidocaine-Epinephrine Anaphylaxis  . Toradol [Ketorolac Tromethamine] Anaphylaxis    Throat swelling, rash, feeling of heat over whole body.  . Latex Rash    rash  . Linzess [Linaclotide]     Ab cramps   . Tape Rash   No results found for this or any previous visit (from the past 2160 hour(s)). Objective  Body mass index is 31.52 kg/m. Wt Readings from Last 3 Encounters:  07/26/18 189 lb 6.4 oz (85.9 kg)  07/24/18 189 lb (85.7 kg)  05/23/18 186 lb 9.6 oz (84.6 kg)   Temp Readings from Last 3 Encounters:  07/26/18 (!) 97.5 F (36.4 C) (Oral)  06/07/18 98 F (36.7 C) (Oral)  05/23/18 97.6 F (36.4 C) (Oral)    BP Readings from Last 3 Encounters:  07/26/18 (!) 100/58  07/24/18 100/64  06/07/18 102/60   Pulse Readings from Last 3 Encounters:  07/26/18 64  07/24/18 64  06/07/18 64  Physical Exam Vitals signs and nursing note reviewed.  Constitutional:      Appearance: Normal appearance. She is well-developed and well-groomed.  HENT:     Head: Normocephalic and atraumatic.     Nose: Nose normal.     Mouth/Throat:     Mouth: Mucous membranes are moist.     Pharynx: Oropharynx is clear.  Eyes:     Conjunctiva/sclera: Conjunctivae normal.     Pupils: Pupils are equal, round, and reactive to light.  Cardiovascular:     Rate and Rhythm: Normal rate and regular rhythm.     Heart sounds: Normal heart sounds.  Pulmonary:     Effort: Pulmonary effort is normal.     Breath sounds: Normal breath sounds.  Abdominal:     Tenderness: There is abdominal tenderness in the right lower quadrant. There is no right CVA tenderness, left CVA tenderness, guarding or rebound.    Skin:    General: Skin is warm and dry.  Neurological:     General: No focal deficit present.     Mental Status: She is alert and oriented to person, place, and time. Mental status is at baseline.     Gait: Gait normal.  Psychiatric:        Attention and Perception: Attention and perception normal.        Mood and Affect: Mood and affect normal.        Speech: Speech normal.        Behavior: Behavior normal. Behavior is cooperative.        Thought Content: Thought content normal.        Cognition and Memory: Cognition and memory normal.        Judgment: Judgment normal.     Assessment   1. Right ovarian cyst with right pelvic pain  FINDINGS:   UTERUS/CERVIX: The uterus is anteverted and slightly heterogeneous. No focal myometrial mass was seen.  The endometrium within normal limits in thickness for patient age and phase of cycle. Nabothian cysts within cervix. Trace fluid in the cervical canal.  OVARIES: The  ovaries were seen well transvaginally. Small cystic areas were seen within both ovaries compatible with follicles.  Appropriate flow was seen in both ovaries with color and spectral  doppler. A dominant follicle within the right ovary measures 2.0 x 2.0 x 1.7 cm. Additional anechoic simple cystic structure along the right ovary measures 0.7 cm and may represent a small paraovarian or exophytic ovarian cyst.  OTHER: Trace free fluid surrounding the right ovary.  IMPRESSION: -- No evidence of ovarian torsion during examination. -- 2.0 cm dominant follicle within the right ovary and additional 0.7 cm paraovarian/exophytic ovarian simple cyst. Unremarkable left ovary.  Please see below for data measurements: LMP: x 1 week ago  Uterus: length 7.8 cm; width 4.5 cm; height 2.7 cm Endometrium: 0.75 cm Right ovary: length 3.6 cm; transverse 3.1 cm; AP 2.6 cm Left ovary: length 3.7 cm; transverse 2.3. cm; AP 1.7 cm  Status     Plan   1.  Pt will call to sch ob/gyn appt sooner than 08/05/2018  Note for work  Percocet 5-325 mg bid prn   Provider: Dr. [name] McLean-Scocuzza-Internal Medicine

## 2018-07-26 NOTE — Patient Instructions (Addendum)
-- No evidence of ovarian torsion during examination. -- 2.0 cm dominant follicle within the right ovary and additional 0.7 cm paraovarian/exophytic ovarian simple cyst. Unremarkable left ovary.  Please see below for data measurements: LMP: x 1 week ago  Uterus: length 7.8 cm; width 4.5 cm; height 2.7 cm Endometrium: 0.75 cm Right ovary: length 3.6 cm; transverse 3.1 cm; AP 2.6 cm Left ovary: length 3.7 cm; transverse 2.3. cm; AP 1.7 cm  Result Narrative  EXAM: [place] ENDOVAGINAL (NON-OB) DATE: 07/23/2018 1:25 AM ACCESSION: [id number] UN DICTATED: 07/23/2018 1:35 AM INTERPRETATION LOCATION: [place]  CLINICAL INDICATION: 28 years old Female with right adnexal pain, concern for torsion vs cyst   TECHNIQUE: Ultrasound views of the pelvis were obtained endovaginally using gray scale and color Doppler imaging. Spectral Doppler was also performed.  COMPARISON: CT abdomen pelvis 01/03/2004.  FINDINGS:   UTERUS/CERVIX: The uterus is anteverted and slightly heterogeneous. No focal myometrial mass was seen. The endometrium within normal limits in thickness for patient age and phase of cycle. Nabothian cysts within cervix. Trace fluid in the cervical canal.  OVARIES: The ovaries were seen well transvaginally. Small cystic areas were seen within both ovaries compatible with follicles. Appropriate flow was seen in both ovaries with color and spectral doppler. A dominant follicle within the right ovary measures 2.0 x 2.0 x 1.7 cm. Additional anechoic simple cystic structure along the right ovary measures 0.7 cm and may represent a small paraovarian or exophytic ovarian cyst.  OTHER: Trace free fluid surrounding the right ovary.    Ovarian Cyst     An ovarian cyst is a fluid-filled sac that forms on an ovary. The ovaries are small organs that produce eggs in women. Various types of cysts can form on the ovaries. Some may cause symptoms and require treatment. Most ovarian cysts go away on their  own, are not cancerous (are benign), and do not cause problems. Common types of ovarian cysts include:  Functional (follicle) cysts. ? Occur during the menstrual cycle, and usually go away with the next menstrual cycle if you do not get pregnant. ? Usually cause no symptoms.  Endometriomas. ? Are cysts that form from the tissue that lines the uterus (endometrium). ? Are sometimes called "chocolate cysts" because they become filled with blood that turns brown. ? Can cause pain in the lower abdomen during intercourse and during your period.  Cystadenoma cysts. ? Develop from cells on the outside surface of the ovary. ? Can get very large and cause lower abdomen pain and pain with intercourse. ? Can cause severe pain if they twist or break open (rupture).  Dermoid cysts. ? Are sometimes found in both ovaries. ? May contain different kinds of body tissue, such as skin, teeth, hair, or cartilage. ? Usually do not cause symptoms unless they get very big.  Theca lutein cysts. ? Occur when too much of a certain hormone (human chorionic gonadotropin) is produced and overstimulates the ovaries to produce an egg. ? Are most common after having procedures used to assist with the conception of a baby (in vitro fertilization). What are the causes? Ovarian cysts may be caused by:  Ovarian hyperstimulation syndrome. This is a condition that can develop from taking fertility medicines. It causes multiple large ovarian cysts to form.  Polycystic ovarian syndrome (PCOS). This is a common hormonal disorder that can cause ovarian cysts, as well as problems with your period or fertility. What increases the risk? The following factors may make you more likely to develop  ovarian cysts:  Being overweight or obese.  Taking fertility medicines.  Taking certain forms of hormonal birth control.  Smoking. What are the signs or symptoms? Many ovarian cysts do not cause symptoms. If symptoms are present,  they may include:  Pelvic pain or pressure.  Pain in the lower abdomen.  Pain during sex.  Abdominal swelling.  Abnormal menstrual periods.  Increasing pain with menstrual periods. How is this diagnosed? These cysts are commonly found during a routine pelvic exam. You may have tests to find out more about the cyst, such as:  Ultrasound.  X-ray of the pelvis.  CT scan.  MRI.  Blood tests. How is this treated? Many ovarian cysts go away on their own without treatment. Your health care provider may want to check your cyst regularly for 2-3 months to see if it changes. If you are in menopause, it is especially important to have your cyst monitored closely because menopausal women have a higher rate of ovarian cancer. When treatment is needed, it may include:  Medicines to help relieve pain.  A procedure to drain the cyst (aspiration).  Surgery to remove the whole cyst.  Hormone treatment or birth control pills. These methods are sometimes used to help dissolve a cyst. Follow these instructions at home:  Take over-the-counter and prescription medicines only as told by your health care provider.  Do not drive or use heavy machinery while taking prescription pain medicine.  Get regular pelvic exams and Pap tests as often as told by your health care provider.  Return to your normal activities as told by your health care provider. Ask your health care provider what activities are safe for you.  Do not use any products that contain nicotine or tobacco, such as cigarettes and e-cigarettes. If you need help quitting, ask your health care provider.  Keep all follow-up visits as told by your health care provider. This is important. Contact a health care provider if:  Your periods are late, irregular, or painful, or they stop.  You have pelvic pain that does not go away.  You have pressure on your bladder or trouble emptying your bladder completely.  You have pain during  sex.  You have any of the following in your abdomen: ? A feeling of fullness. ? Pressure. ? Discomfort. ? Pain that does not go away. ? Swelling.  You feel generally ill.  You become constipated.  You lose your appetite.  You develop severe acne.  You start to have more body hair and facial hair.  You are gaining weight or losing weight without changing your exercise and eating habits.  You think you may be pregnant. Get help right away if:  You have abdominal pain that is severe or gets worse.  You cannot eat or drink without vomiting.  You suddenly develop a fever.  Your menstrual period is much heavier than usual. This information is not intended to replace advice given to you by your health care provider. Make sure you discuss any questions you have with your health care provider. Document Released: 05/15/2005 Document Revised: 12/03/2015 Document Reviewed: 10/17/2015 Elsevier Interactive Patient Education  2019 [place], [place], [address] [place]  [place], [place] [address]  [phone]  [phone] (Fax)

## 2018-08-05 ENCOUNTER — Ambulatory Visit
Admission: RE | Admit: 2018-08-05 | Discharge: 2018-08-05 | Disposition: A | Discharge status: Home / Self Care | Payer: Managed Care, Other (non HMO) | Source: Ambulatory Visit | Attending: Obstetrics and Gynecology | Admitting: Obstetrics and Gynecology

## 2018-08-05 ENCOUNTER — Other Ambulatory Visit: Payer: Self-pay | Admitting: Obstetrics and Gynecology

## 2018-08-05 DIAGNOSIS — R1031 Right lower quadrant pain: Secondary | ICD-10-CM | POA: Diagnosis not present

## 2018-08-05 MED ORDER — IOHEXOL 300 MG/ML  SOLN
100.0000 mL | Freq: Once | INTRAMUSCULAR | Status: AC | PRN
  Administered 2018-08-05: 100 mL via INTRAVENOUS

## 2018-08-06 NOTE — H&P (Signed)
Ruth Tran is a 28 y.o. female here for Follow-up  History of Present Illness: -Patient went to ER on 07/22/2018 for pelvic pain and cyst of right ovary, she was 12 days into her cycle -Patient had suddenly felt pelvic pain enough that she could not move. Her pain was a 10 and was relieved when leaning over in fetal position. Worsens with sitting and standing for long periods of time. Gives bladder frequency. Did have spotting and cramping at the time it started.  Her CT scan today without hernia and ultrasound negative for torsion. Trigger point tenderness at scar. Pain now 9/10  Ultrasound with complex 4.5cm right ovarian cyst with septations.  Per ER: -Pt was tender over her suprapubic region. Pelvic exam revealed thick whiter d/c with no blood. Normal cervix, and no cervical motion tenderness. Right adnexal pain with no mass  US Pelvic (Transvaginal) - NOT PREGNANT  Preliminary Result   -- No evidence of ovarian torsion during examination.   -- 2.0 cm simple cyst within the right ovary and additional 0.7 cm paraovarian/exophytic ovarian simple cyst. Unremarkable left ovary.   Please see below for data measurements:   Uterus: length 7.8 cm; width 4.5 cm; height 2.7 cm  Endometrium: 0.75 cm  Right ovary: length 3.6 cm; transverse 3.1 cm; AP 2.6 cm  Left ovary: length 3.7 cm; transverse 2.3. cm; AP 1.7 cm    Today's Visit: -Pain today in visit is a 5 -RLQ is tender, hurts more when pushed, hurts more when she does a crunch up/sits up -If she stands or sits for a long period of time, it hurts her more   Past Medical History:  has a past medical history of Migraines.  Past Surgical History:  has a past surgical history that includes Appendectomy (2005); Tonsillectomy; WISDOM TEETH EXTRACTION; and 2 TOE SURGERIES. Family History: family history includes Diabetes in her father; Heart disease in her maternal grandfather and paternal grandfather; High blood pressure  (Hypertension) in her mother; Lung cancer in her maternal grandmother; Myocardial Infarction (Heart attack) in her paternal grandfather; Thyroid disease in her maternal grandfather. Social History:  reports that she has never smoked. She has never used smokeless tobacco. She reports that she does not drink alcohol or use drugs. OB/GYN History:          OB History    Gravida  0   Para  0   Term  0   Preterm  0   AB  0   Living  0     SAB  0   TAB  0   Ectopic  0   Molar  0   Multiple  0   Live Births  0          Allergies: is allergic to adhesive; lidocaine; tramadol; and latex. Medications: Current Outpatient Medications:  .  cetirizine (ZYRTEC) 10 mg capsule, Take 10 mg by mouth once daily, Disp: , Rfl:  .  montelukast (SINGULAIR) 10 mg tablet, Take 10 mg by mouth nightly, Disp: , Rfl: 3 .  norgestimate-ethinyl estradioL (SPRINTEC 0.25/35, 28,) 0.25-35 mg-mcg tablet, Take 1 tablet by mouth once daily, Disp: 3 Package, Rfl: 4 .  nortriptyline (PAMELOR) 25 MG capsule, Take 25 mg by mouth nightly, Disp: , Rfl: 2 .  ondansetron (ZOFRAN-ODT) 4 MG disintegrating tablet, DISSOLVE 1 TABLET IN MOUTH EVERY 8 HOURS AS NEEDED FOR NAUSEA, Disp: , Rfl: 3 .  rizatriptan (MAXALT-MLT) 10 MG disintegrating tablet, DISSOLVE 1 TABLET IN MOUTH AS NEEDED  FOR MIGRAINE. MAY REPEAT IN 2 HOURS IF NEEDED, Disp: , Rfl: 11 .  ketorolac (TORADOL) 10 mg tablet, Take 1 tablet (10 mg total) by mouth every 6 (six) hours as needed for Pain for up to 5 days, Disp: 20 tablet, Rfl: 0  Review of Systems: No SOB, no palpitations or chest pain, no new lower extremity edema, no nausea or vomiting or bowel or bladder complaints. See HPI for gyn specific ROS.   Exam:      Vitals:   08/05/18 1443  BP: 102/65  Pulse: 75   Body mass index is 31.28 kg/m.  WD female in NAD  Skin: No rashes, ulcers or skin lesions noted. No excessive hirsutism or acne noted.                                                                                                                                                Neurological: Appears alert and oriented and is a good historian. No gross abnormalities are noted.                                                                                                                              Psychological: Normal affect and mood. No signs of anxiety or depression noted.                                                                                                                                          Abdomen: Soft , no mass, normal active bowel sounds,  Patient is tender on LRQ-hurts more when pushed, hurts more when she sits up - Carnett's maneuver positive directly under her prior open appy scar. No bulging at scar, no palpable mass. Rovsing's sign  negative. Pelvic: deferred   Trigger point injection Procedure - Trigger point injection in 4 locations  After locating areas with the patient's assistance, skin was cleaned with alcohol wipes. 2cc of solution applied into the rectus muscle and massaged into area. Bandaids used if needed. Patient tolerated procedure well.  In 10cc syringe= 1cc of 40mg /ml Kenalog + 7cc of injectable saline   Impression:   The primary encounter diagnosis was Ovarian cyst, right. A diagnosis of Abdominal pain, RLQ (right lower quadrant) was also pertinent to this visit.  Plan:   1) RLQ pain, right ovarian cyst  Ddx incisional hernia, adhesive disease, ruptured ovarian cyst, ovarian torsion Patient returns for a preoperative discussion regarding her plans to proceed with surgical treatment of her right ovarian cyst and pain by dx lap with right ovarian cystectomy, possible LOA and possible right oophorectomy procedure. The patient and I discussed the technical aspects of the procedure including the potential for risks and complications. These include but are not limited to the risk of infection requiring  post-operative antibiotics or further procedures. We talked about the risk of injury to adjacent organs including bladder, bowel, ureter, blood vessels or nerves. We talked about the need to convert to an open incision. We talked about the possible need for blood transfusion. We talked aboutpostop complications such asthromboembolic or cardiopulmonary complications. All of her questions were answered.  Her preoperative exam was completed and the appropriate consents were signed. She is scheduled to undergo this procedure in the near future.  Specific Peri-operative Considerations:  - Consent: obtained today - Labs: CBC, CMP preoperatively - Studies: EKG, CXR preoperatively - Bowel Preparation: None required - Abx:  None indicated - VTE ppx: SCDs perioperatively - Glucose Protocol: na - Beta-blockade: n/a  Of notet:  Hx of anaphylaxis with lidocaine and tramadol.   Diagnoses and all orders for this visit:  Ovarian cyst, right

## 2018-08-08 ENCOUNTER — Encounter
Admission: RE | Admit: 2018-08-08 | Discharge: 2018-08-08 | Disposition: A | Discharge status: Home / Self Care | Payer: Managed Care, Other (non HMO) | Source: Ambulatory Visit | Attending: Obstetrics and Gynecology | Admitting: Obstetrics and Gynecology

## 2018-08-08 ENCOUNTER — Other Ambulatory Visit: Payer: Self-pay

## 2018-08-08 NOTE — Patient Instructions (Signed)
Your procedure is scheduled on: 08-12-18  Report to Same Day Surgery 2nd floor medical mall [facility] Entrance-take elevator on left to 2nd floor.  Check in with surgery information desk.) To find out your arrival time please call [phone] between 1PM - 3PM on 08-09-18  Remember: Instructions that are not followed completely may result in serious medical risk, up to and including death, or upon the discretion of your surgeon and anesthesiologist your surgery may need to be rescheduled.    _x___ 1. Do not eat food after midnight the night before your procedure. NO GUM OR CANDY AFTER MIDNIGHT. You may drink clear liquids up to 2 hours before you are scheduled to arrive at the hospital for your procedure.  Do not drink clear liquids within 2 hours of your scheduled arrival to the hospital.  Clear liquids include  --Water or Apple juice without pulp  --Clear carbohydrate beverage such as ClearFast or Gatorade  --Black Coffee or Clear Tea (No milk, no creamers, do not add anything to the coffee or Tea   ____Ensure clear carbohydrate drink on the way to the hospital for bariatric patients  ____Ensure clear carbohydrate drink 3 hours before surgery for Dr Dwyane Luo patients if physician instructed.     __x__ 2. No Alcohol for 24 hours before or after surgery.   __x__3. No Smoking or e-cigarettes for 24 prior to surgery.  Do not use any chewable tobacco products for at least 6 hour prior to surgery   ____  4. Bring all medications with you on the day of surgery if instructed.    __x__ 5. Notify your doctor if there is any change in your medical condition     (cold, fever, infections).    x___6. On the morning of surgery brush your teeth with toothpaste and water.  You may rinse your mouth with mouth wash if you wish.  Do not swallow any toothpaste or mouthwash.   Do not wear jewelry, make-up, hairpins, clips or nail polish.  Do not wear lotions, powders, or perfumes. You may wear  deodorant.  Do not shave 48 hours prior to surgery. Men may shave face and neck.  Do not bring valuables to the hospital.    [facility] is not responsible for any belongings or valuables.               Contacts, dentures or bridgework may not be worn into surgery.  Leave your suitcase in the car. After surgery it may be brought to your room.  For patients admitted to the hospital, discharge time is determined by your  treatment team.  _  Patients discharged the day of surgery will not be allowed to drive home.  You will need someone to drive you home and stay with you the night of your procedure.    Please read over the following fact sheets that you were given:   [facility] Preparing for Surgery   _x___ Take anti-hypertensive listed below, cardiac, seizure, asthma, anti-reflux and psychiatric medicines. These include:  1. YOU MAY TAKE YOUR NORCO OR PERCOCET IF NEEDED DAY OF SURGERY  2.  3.  4.  5.  6.  ____Fleets enema or Magnesium Citrate as directed.   ____ Use CHG Soap or sage wipes as directed on instruction sheet   ____ Use inhalers on the day of surgery and bring to hospital day of surgery  ____ Stop Metformin and Janumet 2 days prior to surgery.    ____ Take 1/2  of usual insulin dose the night before surgery and none on the morning surgery.   ____ Follow recommendations from Cardiologist, Pulmonologist or PCP regarding stopping Aspirin, Coumadin, Plavix ,Eliquis, Effient, or Pradaxa, and Pletal.  X____Stop Anti-inflammatories such as Advil, Aleve, Ibuprofen, Motrin, Naproxen, Naprosyn, Goodies powders or aspirin products NOW-OK to take Tylenol    ____ Stop supplements until after surgery.     ____ Bring C-Pap to the hospital.

## 2018-08-12 ENCOUNTER — Ambulatory Visit: Payer: Managed Care, Other (non HMO) | Admitting: Certified Registered"

## 2018-08-12 ENCOUNTER — Encounter
Admission: RE | Disposition: A | Discharge status: Home / Self Care | Payer: Self-pay | Source: Home / Self Care | Attending: Obstetrics and Gynecology

## 2018-08-12 ENCOUNTER — Other Ambulatory Visit: Payer: Self-pay

## 2018-08-12 ENCOUNTER — Encounter: Payer: Self-pay | Admitting: *Deleted

## 2018-08-12 ENCOUNTER — Ambulatory Visit
Admission: RE | Admit: 2018-08-12 | Discharge: 2018-08-12 | Disposition: A | Discharge status: Home / Self Care | Payer: Managed Care, Other (non HMO) | Attending: Obstetrics and Gynecology | Admitting: Obstetrics and Gynecology

## 2018-08-12 DIAGNOSIS — Z8249 Family history of ischemic heart disease and other diseases of the circulatory system: Secondary | ICD-10-CM | POA: Diagnosis not present

## 2018-08-12 DIAGNOSIS — N736 Female pelvic peritoneal adhesions (postinfective): Secondary | ICD-10-CM | POA: Insufficient documentation

## 2018-08-12 DIAGNOSIS — Z888 Allergy status to other drugs, medicaments and biological substances status: Secondary | ICD-10-CM | POA: Insufficient documentation

## 2018-08-12 DIAGNOSIS — N83201 Unspecified ovarian cyst, right side: Secondary | ICD-10-CM | POA: Insufficient documentation

## 2018-08-12 DIAGNOSIS — Z8349 Family history of other endocrine, nutritional and metabolic diseases: Secondary | ICD-10-CM | POA: Insufficient documentation

## 2018-08-12 DIAGNOSIS — Z791 Long term (current) use of non-steroidal anti-inflammatories (NSAID): Secondary | ICD-10-CM | POA: Diagnosis not present

## 2018-08-12 DIAGNOSIS — Z801 Family history of malignant neoplasm of trachea, bronchus and lung: Secondary | ICD-10-CM | POA: Diagnosis not present

## 2018-08-12 DIAGNOSIS — Z833 Family history of diabetes mellitus: Secondary | ICD-10-CM | POA: Diagnosis not present

## 2018-08-12 DIAGNOSIS — Z885 Allergy status to narcotic agent status: Secondary | ICD-10-CM | POA: Diagnosis not present

## 2018-08-12 DIAGNOSIS — Z79899 Other long term (current) drug therapy: Secondary | ICD-10-CM | POA: Diagnosis not present

## 2018-08-12 HISTORY — PX: LYSIS OF ADHESION: SHX5961

## 2018-08-12 HISTORY — PX: CHROMOPERTUBATION: SHX6288

## 2018-08-12 HISTORY — PX: LAPAROSCOPIC OVARIAN CYSTECTOMY: SHX6248

## 2018-08-12 LAB — BASIC METABOLIC PANEL
Anion gap: 7 (ref 5–15)
BUN: 12 mg/dL (ref 6–20)
CO2: 24 mmol/L (ref 22–32)
CREATININE: 0.56 mg/dL (ref 0.44–1.00)
Calcium: 8.8 mg/dL — ABNORMAL LOW (ref 8.9–10.3)
Chloride: 106 mmol/L (ref 98–111)
GFR calc Af Amer: >60 mL/min (ref >60)
GFR calc non Af Amer: >60 mL/min (ref >60)
GLUCOSE: 83 mg/dL (ref 70–99)
Potassium: 3.7 mmol/L (ref 3.5–5.1)
Sodium: 137 mmol/L (ref 135–145)

## 2018-08-12 LAB — CBC
HCT: 38 % (ref 36.0–46.0)
Hemoglobin: 12.4 g/dL (ref 12.0–15.0)
MCH: 28.8 pg (ref 26.0–34.0)
MCHC: 32.6 g/dL (ref 30.0–36.0)
MCV: 88.4 fL (ref 80.0–100.0)
Platelets: 158 10*3/uL (ref 150–400)
RBC: 4.3 MIL/uL (ref 3.87–5.11)
RDW: 13.2 % (ref 11.5–15.5)
WBC: 6.8 10*3/uL (ref 4.0–10.5)
nRBC: 0 % (ref 0.0–0.2)

## 2018-08-12 LAB — TYPE AND SCREEN
ABO/RH(D): A POS
Antibody Screen: NEGATIVE

## 2018-08-12 LAB — ABO/RH: ABO/RH(D): A POS

## 2018-08-12 LAB — POCT PREGNANCY, URINE: Preg Test, Ur: NEGATIVE

## 2018-08-12 SURGERY — EXCISION, CYST, OVARY, LAPAROSCOPIC
Anesthesia: General | Site: Abdomen | Laterality: Right

## 2018-08-12 MED ORDER — ACETAMINOPHEN 500 MG PO TABS
ORAL_TABLET | ORAL | Status: AC
  Administered 2018-08-12: 1000 mg via ORAL
  Filled 2018-08-12: qty 2

## 2018-08-12 MED ORDER — ONDANSETRON HCL 4 MG/2ML IJ SOLN: 4.0000 mg | Freq: Once | INTRAMUSCULAR | Status: DC | PRN

## 2018-08-12 MED ORDER — LORAZEPAM 2 MG/ML IJ SOLN
1.0000 mg | Freq: Once | INTRAMUSCULAR | Status: AC
  Administered 2018-08-12: 1 mg via INTRAVENOUS

## 2018-08-12 MED ORDER — TRIAMCINOLONE ACETONIDE 40 MG/ML IJ SUSP
INTRAMUSCULAR | Status: AC
  Filled 2018-08-12: qty 1

## 2018-08-12 MED ORDER — ACETAMINOPHEN 500 MG PO TABS
1000.0000 mg | ORAL_TABLET | ORAL | Status: AC
  Administered 2018-08-12: 1000 mg via ORAL

## 2018-08-12 MED ORDER — GABAPENTIN 300 MG PO CAPS
ORAL_CAPSULE | ORAL | Status: AC
  Administered 2018-08-12: 300 mg via ORAL
  Filled 2018-08-12: qty 1

## 2018-08-12 MED ORDER — EPHEDRINE SULFATE 50 MG/ML IJ SOLN
INTRAMUSCULAR | Status: DC | PRN
  Administered 2018-08-12: 10 mg via INTRAVENOUS

## 2018-08-12 MED ORDER — METHYLENE BLUE 0.5 % INJ SOLN
INTRAVENOUS | Status: DC | PRN
  Administered 2018-08-12: 15 mL via SUBMUCOSAL

## 2018-08-12 MED ORDER — LACTATED RINGERS IV SOLN
INTRAVENOUS | Status: DC
  Administered 2018-08-12: 12:00:00 via INTRAVENOUS

## 2018-08-12 MED ORDER — PROPOFOL 10 MG/ML IV BOLUS
INTRAVENOUS | Status: AC
  Filled 2018-08-12: qty 20

## 2018-08-12 MED ORDER — FENTANYL CITRATE (PF) 100 MCG/2ML IJ SOLN
25.0000 ug | INTRAMUSCULAR | Status: DC | PRN
  Administered 2018-08-12 (×3): 25 ug via INTRAVENOUS

## 2018-08-12 MED ORDER — ONDANSETRON HCL 4 MG/2ML IJ SOLN
INTRAMUSCULAR | Status: DC | PRN
  Administered 2018-08-12: 4 mg via INTRAVENOUS

## 2018-08-12 MED ORDER — DEXAMETHASONE SODIUM PHOSPHATE 10 MG/ML IJ SOLN
INTRAMUSCULAR | Status: DC | PRN
  Administered 2018-08-12: 4 mg via INTRAVENOUS

## 2018-08-12 MED ORDER — SODIUM CHLORIDE (PF) 0.9 % IJ SOLN
INTRAMUSCULAR | Status: AC
  Filled 2018-08-12: qty 10

## 2018-08-12 MED ORDER — FAMOTIDINE 20 MG PO TABS
ORAL_TABLET | ORAL | Status: AC
  Administered 2018-08-12: 20 mg via ORAL
  Filled 2018-08-12: qty 1

## 2018-08-12 MED ORDER — FENTANYL CITRATE (PF) 100 MCG/2ML IJ SOLN
INTRAMUSCULAR | Status: AC
  Filled 2018-08-12: qty 2

## 2018-08-12 MED ORDER — METHYLENE BLUE 0.5 % INJ SOLN
INTRAVENOUS | Status: AC
  Filled 2018-08-12: qty 10

## 2018-08-12 MED ORDER — GABAPENTIN 300 MG PO CAPS
300.0000 mg | ORAL_CAPSULE | ORAL | Status: AC
  Administered 2018-08-12: 300 mg via ORAL

## 2018-08-12 MED ORDER — MIDAZOLAM HCL 2 MG/2ML IJ SOLN
INTRAMUSCULAR | Status: DC | PRN
  Administered 2018-08-12: 2 mg via INTRAVENOUS

## 2018-08-12 MED ORDER — TRIAMCINOLONE ACETONIDE 40 MG/ML IJ SUSP
INTRAMUSCULAR | Status: DC | PRN
  Administered 2018-08-12: 40 mg via INTRAMUSCULAR

## 2018-08-12 MED ORDER — BUPIVACAINE HCL 0.5 % IJ SOLN
INTRAMUSCULAR | Status: DC | PRN
  Administered 2018-08-12: 15 mL

## 2018-08-12 MED ORDER — ROCURONIUM BROMIDE 100 MG/10ML IV SOLN
INTRAVENOUS | Status: DC | PRN
  Administered 2018-08-12: 50 mg via INTRAVENOUS

## 2018-08-12 MED ORDER — SODIUM CHLORIDE (PF) 0.9 % IJ SOLN
INTRAMUSCULAR | Status: AC
  Filled 2018-08-12: qty 50

## 2018-08-12 MED ORDER — OXYCODONE HCL 5 MG PO CAPS: 5.0000 mg | ORAL_CAPSULE | Freq: Four times a day (QID) | ORAL | 0 refills | Status: DC | PRN

## 2018-08-12 MED ORDER — PROPOFOL 10 MG/ML IV BOLUS
INTRAVENOUS | Status: DC | PRN
  Administered 2018-08-12: 200 mg via INTRAVENOUS

## 2018-08-12 MED ORDER — SUGAMMADEX SODIUM 200 MG/2ML IV SOLN
INTRAVENOUS | Status: DC | PRN
  Administered 2018-08-12: 200 mg via INTRAVENOUS

## 2018-08-12 MED ORDER — ACETAMINOPHEN 500 MG PO TABS: 1000.0000 mg | ORAL_TABLET | Freq: Four times a day (QID) | ORAL | 0 refills | Status: AC

## 2018-08-12 MED ORDER — DOCUSATE SODIUM 100 MG PO CAPS: 100.0000 mg | ORAL_CAPSULE | Freq: Two times a day (BID) | ORAL | 0 refills | Status: DC

## 2018-08-12 MED ORDER — BUPIVACAINE HCL (PF) 0.5 % IJ SOLN
INTRAMUSCULAR | Status: AC
  Filled 2018-08-12: qty 30

## 2018-08-12 MED ORDER — IBUPROFEN 800 MG PO TABS: 800.0000 mg | ORAL_TABLET | Freq: Three times a day (TID) | ORAL | 1 refills | Status: AC

## 2018-08-12 MED ORDER — LORAZEPAM 2 MG/ML IJ SOLN
INTRAMUSCULAR | Status: AC
  Filled 2018-08-12: qty 1

## 2018-08-12 MED ORDER — GABAPENTIN 800 MG PO TABS: 800.0000 mg | ORAL_TABLET | Freq: Every day | ORAL | 0 refills | Status: DC

## 2018-08-12 MED ORDER — FAMOTIDINE 20 MG PO TABS
20.0000 mg | ORAL_TABLET | Freq: Once | ORAL | Status: AC
  Administered 2018-08-12: 20 mg via ORAL

## 2018-08-12 MED ORDER — MIDAZOLAM HCL 2 MG/2ML IJ SOLN
INTRAMUSCULAR | Status: AC
  Filled 2018-08-12: qty 2

## 2018-08-12 MED ORDER — LACTATED RINGERS IV SOLN
INTRAVENOUS | Status: DC
  Administered 2018-08-12: 13:00:00 via INTRAVENOUS

## 2018-08-12 MED ORDER — FENTANYL CITRATE (PF) 100 MCG/2ML IJ SOLN
INTRAMUSCULAR | Status: DC | PRN
  Administered 2018-08-12 (×2): 50 ug via INTRAVENOUS

## 2018-08-12 SURGICAL SUPPLY — 46 items
BAG URINE DRAINAGE (UROLOGICAL SUPPLIES) ×4 IMPLANT
BLADE SURG SZ11 CARB STEEL (BLADE) ×4 IMPLANT
CATH FOLEY 2WAY  5CC 16FR (CATHETERS) ×2
CATH URTH 16FR FL 2W BLN LF (CATHETERS) ×2 IMPLANT
CHLORAPREP W/TINT 26 (MISCELLANEOUS) ×4 IMPLANT
CLOSURE WOUND 1/4X4 (GAUZE/BANDAGES/DRESSINGS) ×1
COVER WAND RF STERILE (DRAPES) ×4 IMPLANT
DERMABOND ADVANCED (GAUZE/BANDAGES/DRESSINGS) ×2
DERMABOND ADVANCED .7 DNX12 (GAUZE/BANDAGES/DRESSINGS) ×2 IMPLANT
DRAPE GENERAL ENDO 106X123.5 (DRAPES) ×4 IMPLANT
DRAPE LEGGINS SURG 28X43 STRL (DRAPES) ×4 IMPLANT
DRAPE UNDER BUTTOCK W/FLU (DRAPES) ×4 IMPLANT
GLOVE BIO SURGEON STRL SZ7 (GLOVE) ×8 IMPLANT
GLOVE INDICATOR 7.5 STRL GRN (GLOVE) ×4 IMPLANT
GOWN STRL REUS W/ TWL LRG LVL3 (GOWN DISPOSABLE) ×4 IMPLANT
GOWN STRL REUS W/TWL LRG LVL3 (GOWN DISPOSABLE) ×4
IRRIGATION STRYKERFLOW (MISCELLANEOUS) IMPLANT
IRRIGATOR STRYKERFLOW (MISCELLANEOUS)
IV NS 1000ML (IV SOLUTION) ×2
IV NS 1000ML BAXH (IV SOLUTION) ×2 IMPLANT
KIT PINK PAD W/HEAD ARE REST (MISCELLANEOUS) ×4
KIT PINK PAD W/HEAD ARM REST (MISCELLANEOUS) ×2 IMPLANT
KIT TURNOVER CYSTO (KITS) ×4 IMPLANT
LABEL OR SOLS (LABEL) ×4 IMPLANT
LIGASURE LAP MARYLAND 5MM 37CM (ELECTROSURGICAL) ×4 IMPLANT
LIGASURE VESSEL 5MM BLUNT TIP (ELECTROSURGICAL) IMPLANT
NDL SAFETY ECLIPSE 18X1.5 (NEEDLE) ×2 IMPLANT
NEEDLE HYPO 18GX1.5 SHARP (NEEDLE) ×2
NEEDLE HYPO 22GX1.5 SAFETY (NEEDLE) ×4 IMPLANT
NS IRRIG 500ML POUR BTL (IV SOLUTION) IMPLANT
PACK GYN LAPAROSCOPIC (MISCELLANEOUS) ×4 IMPLANT
PAD OB MATERNITY 4.3X12.25 (PERSONAL CARE ITEMS) ×4 IMPLANT
PAD PREP 24X41 OB/GYN DISP (PERSONAL CARE ITEMS) ×4 IMPLANT
POUCH SPECIMEN RETRIEVAL 10MM (ENDOMECHANICALS) IMPLANT
SCISSORS METZENBAUM CVD 33 (INSTRUMENTS) IMPLANT
SET TUBE SMOKE EVAC HIGH FLOW (TUBING) ×4 IMPLANT
SLEEVE ENDOPATH XCEL 5M (ENDOMECHANICALS) ×8 IMPLANT
STRIP CLOSURE SKIN 1/4X4 (GAUZE/BANDAGES/DRESSINGS) ×3 IMPLANT
SUT MNCRL AB 4-0 PS2 18 (SUTURE) ×4 IMPLANT
SUT VIC AB 2-0 UR6 27 (SUTURE) ×4 IMPLANT
SUT VIC AB 4-0 SH 27 (SUTURE) ×2
SUT VIC AB 4-0 SH 27XANBCTRL (SUTURE) ×2 IMPLANT
SYR 10ML LL (SYRINGE) ×4 IMPLANT
SYR 5ML LL (SYRINGE) ×4 IMPLANT
TROCAR XCEL NON-BLD 5MMX100MML (ENDOMECHANICALS) ×4 IMPLANT
TUBING ART PRESS 48 MALE/FEM (TUBING) ×4 IMPLANT

## 2018-08-12 NOTE — Discharge Instructions (Signed)
Laparoscopic Ovarian Surgery Discharge Instructions  For the next three days, take ibuprofen and acetaminophen on a schedule, every 8 hours. You can take them together or you can intersperse them, and take one every four hours. I also gave you gabapentin for nighttime, to help you sleep and also to control pain. Take gabapentin medicines at night for at least the next 3 nights. You also have a narcotic, oxycodone, to take as needed if the above medicines don't help.  Postop constipation is a major cause of pain. Stay well hydrated, walk as you tolerate, and take over the counter senna as well as stool softeners if you need them.   RISKS AND COMPLICATIONS   Infection.  Bleeding.  Injury to surrounding organs.  Anesthetic side effects.   PROCEDURE   You may be given a medicine to help you relax (sedative) before the procedure. You will be given a medicine to make you sleep (general anesthetic) during the procedure.  A tube will be put down your throat to help your breath while under general anesthesia.  Several small cuts (incisions) are made in the lower abdominal area and one incision is made near the belly button.  Your abdominal area will be inflated with a safe gas (carbon dioxide). This helps give the surgeon room to operate, visualize, and helps the surgeon avoid other organs.  A thin, lighted tube (laparoscope) with a camera attached is inserted into your abdomen through the incision near the belly button. Other small instruments may also be inserted through other abdominal incisions.  The ovary is located and are removed.  After the ovary is removed, the gas is released from the abdomen.  The incisions will be closed with stitches (sutures), and Dermabond. A bandage may be placed over the incisions.  AFTER THE PROCEDURE   You will also have some mild abdominal discomfort for 3-7 days. You will be given pain medicine to ease any discomfort.  As long as there are no  problems, you may be allowed to go home. Someone will need to drive you home and be with you for at least 24 hours once home.  You may have some mild discomfort in the throat. This is from the tube placed in your throat while you were sleeping.  You may experience discomfort in the shoulder area from some trapped air between the liver and diaphragm. This sensation is normal and will slowly go away on its own.  HOME CARE INSTRUCTIONS   Take all medicines as directed.  Only take over-the-counter or prescription medicines for pain, discomfort, or fever as directed by your caregiver.  Resume daily activities as directed.  Showers are preferred over baths for 2 weeks.  You may resume sexual activities in 1 week or as you feel you would like to.  Do not drive while taking narcotics.  SEEK MEDICAL CARE IF: .  There is increasing abdominal pain.  You feel lightheaded or faint.  You have the chills.  You have an oral temperature above 102 F (38.9 C).  There is pus-like (purulent) drainage from any of the wounds.  You are unable to pass gas or have a bowel movement.  You feel sick to your stomach (nauseous) or throw up (vomit) and can't control it with your medicines.  MAKE SURE YOU:   Understand these instructions.  Will watch your condition.  Will get help right away if you are not doing well or get worse.  ExitCare Patient Information 2013 [place].  AMBULATORY SURGERY  DISCHARGE INSTRUCTIONS   1) The drugs that you were given will stay in your system until tomorrow so for the next 24 hours you should not:  A) Drive an automobile B) Make any legal decisions C) Drink any alcoholic beverage   2) You may resume regular meals tomorrow.  Today it is better to start with liquids and gradually work up to solid foods.  You may eat anything you prefer, but it is better to start with liquids, then soup and crackers, and gradually work up to solid  foods.   3) Please notify your doctor immediately if you have any unusual bleeding, trouble breathing, redness and pain at the surgery site, drainage, fever, or pain not relieved by medication.    4) Additional Instructions: TAKE A STOOL SOFTENER TWICE A DAY WHILE TAKING NARCOTIC PAIN MEDICINE TO PREVENT CONSTIPATION   Please contact your physician with any problems or Same Day Surgery at [phone], Monday through Friday 6 am to 4 pm, or [place] at [facility] number at [phone].

## 2018-08-12 NOTE — Transfer of Care (Signed)
Immediate Anesthesia Transfer of Care Note  Patient: ALIZEA PELL  Procedure(s) Performed: LAPAROSCOPIC OVARIAN CYSTECTOMY (Right Abdomen) LYSIS OF ADHESIONS Lysis of Omental Adhesions, right cecum adhesions, right fallopian tube and right adnexal adhesions (Right Abdomen) CHROMOPERTUBATION (Right Abdomen)  Patient Location: PACU  Anesthesia Type:General  Level of Consciousness: awake, alert  and oriented  Airway & Oxygen Therapy: Patient Spontanous Breathing and Patient connected to face mask oxygen  Post-op Assessment: Report given to RN and Post -op Vital signs reviewed and stable  Post vital signs: Reviewed and stable  Last Vitals:  Vitals Value Taken Time  BP    Temp    Pulse    Resp    SpO2      Last Pain:  Vitals:   08/12/18 1151  TempSrc: Tympanic  PainSc: 0-No pain         Complications: No apparent anesthesia complications

## 2018-08-12 NOTE — Anesthesia Preprocedure Evaluation (Signed)
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)  Airway Mallampati: II       Dental   Pulmonary neg COPD,           Cardiovascular (-) hypertension(-) Past MI and (-) CHF (-) dysrhythmias (-) Valvular Problems/Murmurs     Neuro/Psych neg Seizures Anxiety    GI/Hepatic Neg liver ROS, neg GERD  ,  Endo/Other  neg diabetesHyperthyroidism (graves dz)   Renal/GU negative Renal ROS     Musculoskeletal   Abdominal   Peds  Hematology   Anesthesia Other Findings   Reproductive/Obstetrics                             Anesthesia Physical Anesthesia Plan  ASA: II  Anesthesia Plan: General   Post-op Pain Management:    Induction:   PONV Risk Score and Plan: 3 and Dexamethasone, Ondansetron and Midazolam  Airway Management Planned: Oral ETT  Additional Equipment:   Intra-op Plan:   Post-operative Plan:   Informed Consent: I have reviewed the patients History and Physical, chart, labs and discussed the procedure including the risks, benefits and alternatives for the proposed anesthesia with the patient or authorized representative who has indicated his/her understanding and acceptance.       Plan Discussed with:   Anesthesia Plan Comments:         Anesthesia Quick Evaluation

## 2018-08-12 NOTE — Op Note (Signed)
Ruth Tran PROCEDURE DATE: 08/12/2018  PREOPERATIVE DIAGNOSIS: Right lower quadrant pain; pelvic pain; Rt ovarian cyst POSTOPERATIVE DIAGNOSIS: Significant adhesive disease; right ovarian hemorrhagic cyst; right fallopian tube adhesions PROCEDURE:  -Right lower quadrant trigger point injections -Lysis of right lower quadrant adhesions surrounding the bowel -Lysis of omental adhesions -Lysis of right fallopian tube and right adnexal adhesions -Right ovarian cystectomy -Chromotubation  SURGEON:  Dr. Benjaman Kindler ASSISTANT: Dr. Marcello Moores Schermerhorn ANESTHESIOLOGIST: Alvin Critchley, MD Anesthesiologist: Alvin Critchley, MD CRNA: Disser, Einar Grad, CRNA; Philbert Riser, CRNA  INDICATIONS: 28 y.o. G0P0000 with history of acute right lower quadrant pelvic pain and a hx of open appy as a child, with a large right ovarian cyst, desiring surgical evaluation.   Please see preoperative notes for further details.  Risks of surgery were discussed with the patient including but not limited to: bleeding which may require transfusion or reoperation; infection which may require antibiotics; injury to bowel, bladder, ureters or other surrounding organs; need for additional procedures including laparotomy; thromboembolic phenomenon, incisional problems and other postoperative/anesthesia complications. Written informed consent was obtained.    FINDINGS:  Small uterus, normal left ovary.  Enlarged right ovary with a right ovarian cyst.  Significant omental adhesions at the umbilicus and in the right lower quadrant.  These obscured the right lower quadrant on the bowel in this area.  Omental adhesions were significant from the umbilicus down to the pelvis along the anterior abdominal wall.   Of particular note, the right fallopian tube was adherent to the right pelvic brim, as part of the peritoneal adhesion complex in the right lower quadrant.    No other abdominal/pelvic abnormality.  Normal upper  abdomen.  ANESTHESIA:    General INTRAVENOUS FLUIDS: 800 ml ESTIMATED BLOOD LOSS: minimal. However, her right IV did leak during the case for a small amount of unmeasured blood to the floor.  URINE OUTPUT: 200 ml SPECIMENS: Right ovarian cyst wall COMPLICATIONS: None immediate  PROCEDURE IN DETAIL:  The patient had sequential compression devices applied to her lower extremities while in the preoperative area.  She was then taken to the operating room where general anesthesia was administered and was found to be adequate.    Trigger point injection in the right lower quadrant surrounding the site of her appendectomy scar was performed, using 1 mL of 40 Kenalog mixed with 9 mL's of injectable saline.  She was placed in the dorsal lithotomy position, and was prepped and draped in a sterile manner.  A Foley catheter was inserted into her bladder and attached to constant drainage and a uterine manipulator was then advanced into the uterus.  After an adequate timeout was performed, attention was turned to the abdomen where an umbilical incision was made with the scalpel.  The Optiview 5-mm trocar and sleeve were then advanced without difficulty with the laparoscope under direct visualization into the abdomen.  The abdomen was then insufflated with carbon dioxide gas and adequate pneumoperitoneum was obtained.   Adhesions were present at this site, and visualization was poor.  Therefore, a left upper quadrant 5 mm port was placed under direct visualization at Palmer's point.  Review of the pelvis and right lower quadrant revealed the omental adhesions as above.  A detailed survey of the patient's pelvis and abdomen revealed the findings as mentioned above.  Right and left lower quadrant ports was placed under direct visualization after sharp and blunt dissection of the omentum was performed to open up the space.  All of the  omental adhesions were taken down under direct visualization using a LigaSure  device.  The right ureter was evaluated and found to be outside of the operative field.  The right fallopian tube was then freed from its underlying adhesions, paying careful attention to the integrity of the tube.  However, given its attachment, chromotubation was performed at this time which revealed bilateral spillage from both of the tubes.  Right ovarian cyst was then entered and old blood spilled out.  The cyst wall was removed using a standard pulling technique.  The cyst bed was evaluated and no bleeding or welling was noted.  The pelvis was irrigated, and all the blood from the cyst and the chromotubation fluid was removed.  The operative site was surveyed, and it was found to be hemostatic.  No intraoperative injury to surrounding organs was noted.  Taking down adhesions comprised the majority of this case, for at least an hour of operating time.  Pictures were taken of the quadrants and pelvis. The abdomen was desufflated and all instruments were then removed from the patient's abdomen. The uterine manipulator was removed without complications.  All incisions were closed with 4-0 Monocryl and Dermabond.   The patient tolerated the procedures well.  All instruments, needles, and sponge counts were correct x 2. The patient was taken to the recovery room in stable condition.

## 2018-08-12 NOTE — Anesthesia Procedure Notes (Signed)
Procedure Name: Intubation Date/Time: 08/12/2018 12:42 PM Performed by: Philbert Riser, CRNA Pre-anesthesia Checklist: Patient identified, Emergency Drugs available, Suction available, Patient being monitored and Timeout performed Patient Re-evaluated:Patient Re-evaluated prior to induction Oxygen Delivery Method: Circle system utilized and Simple face mask Preoxygenation: Pre-oxygenation with 100% oxygen Induction Type: IV induction Ventilation: Mask ventilation without difficulty Laryngoscope Size: Mac and 3 Grade View: Grade I Tube type: Oral Tube size: 7.0 mm Number of attempts: 1 Airway Equipment and Method: Stylet Placement Confirmation: ETT inserted through vocal cords under direct vision,  positive ETCO2 and breath sounds checked- equal and bilateral Secured at: 21 cm Tube secured with: Tape Dental Injury: Teeth and Oropharynx as per pre-operative assessment

## 2018-08-12 NOTE — Anesthesia Post-op Follow-up Note (Signed)
Anesthesia QCDR form completed.        

## 2018-08-12 NOTE — Interval H&P Note (Signed)
History and Physical Interval Note:  08/12/2018 12:09 PM  Ruth Tran  has presented today for surgery, with the diagnosis of rt ovarian cyst, pelvic pain.  The various methods of treatment have been discussed with the patient and family. After consideration of risks, benefits and other options for treatment, the patient has consented to  Procedure(s): LAPAROSCOPIC OVARIAN CYSTECTOMY (Right) LYSIS OF ADHESION (N/A) LAPAROSCOPIC OOPHORECTOMY (Right) as a surgical intervention.  The patient's history has been reviewed, patient examined, no change in status, stable for surgery.  I have reviewed the patient's chart and labs.  Questions were answered to the patient's satisfaction.     Benjaman Kindler

## 2018-08-13 ENCOUNTER — Encounter: Payer: Self-pay | Admitting: Obstetrics and Gynecology

## 2018-08-13 NOTE — Anesthesia Postprocedure Evaluation (Signed)
Anesthesia Post Note  Patient: Ruth Tran  Procedure(s) Performed: LAPAROSCOPIC OVARIAN CYSTECTOMY (Right Abdomen) LYSIS OF ADHESIONS Lysis of Omental Adhesions, right cecum adhesions, right fallopian tube and right adnexal adhesions (Right Abdomen) CHROMOPERTUBATION (Right Abdomen)  Patient location during evaluation: PACU Anesthesia Type: General Level of consciousness: awake and alert and oriented Pain management: pain level controlled Vital Signs Assessment: post-procedure vital signs reviewed and stable Respiratory status: spontaneous breathing Cardiovascular status: blood pressure returned to baseline Anesthetic complications: no     Last Vitals:  Vitals:   08/12/18 1546 08/12/18 1618  BP: (!) 107/56 (!) 98/48  Pulse: 72 85  Resp: 16 16  Temp: 36.4 C   SpO2: 100% 100%    Last Pain:  Vitals:   08/12/18 1618  TempSrc:   PainSc: 8                  Ellyn Rubiano

## 2018-08-13 NOTE — Addendum Note (Signed)
Addendum  created 08/13/18 1727 by Doreen Salvage, CRNA   Charge Capture section accepted

## 2018-08-14 LAB — SURGICAL PATHOLOGY

## 2018-08-26 ENCOUNTER — Telehealth: Payer: Self-pay | Admitting: *Deleted

## 2018-08-26 NOTE — Telephone Encounter (Signed)
Received notice from [place] store [phone] that PA is needed for Aimovig 140 mg.

## 2018-08-26 NOTE — Telephone Encounter (Signed)
PA submitted. Waiting on determination. Key: [id number]  "Your information has been submitted and will be reviewed by [employer]. An electronic determination will be received in CoverMyMeds within 72-120 hours. If Christella Scheuermann has not responded in 120 hours, contact [employer] at [phone]."

## 2018-08-27 NOTE — Telephone Encounter (Signed)
PA Case: [id number], Status: Approved, Coverage Starts on: 08/27/2018 12:00:00 AM, Coverage Ends on: 08/27/2019 12:00:00 AM.

## 2018-09-16 ENCOUNTER — Encounter: Payer: Self-pay | Admitting: Internal Medicine

## 2018-09-16 MED ORDER — CETIRIZINE HCL 10 MG PO TABS: 10.0000 mg | ORAL_TABLET | Freq: Every day | ORAL | 1 refills | Status: DC

## 2018-10-08 ENCOUNTER — Other Ambulatory Visit: Payer: Self-pay | Admitting: Neurology

## 2018-10-08 MED ORDER — VENLAFAXINE HCL ER 75 MG PO CP24: 75.0000 mg | ORAL_CAPSULE | Freq: Every day | ORAL | 11 refills | Status: DC

## 2018-10-08 MED ORDER — GALCANEZUMAB-GNLM 120 MG/ML ~~LOC~~ SOAJ: 120.0000 mg | SUBCUTANEOUS | 11 refills | Status: DC

## 2018-10-22 ENCOUNTER — Encounter: Payer: Self-pay | Admitting: Internal Medicine

## 2018-10-22 ENCOUNTER — Other Ambulatory Visit: Payer: Self-pay

## 2018-10-22 ENCOUNTER — Ambulatory Visit (INDEPENDENT_AMBULATORY_CARE_PROVIDER_SITE_OTHER): Payer: Managed Care, Other (non HMO) | Admitting: Internal Medicine

## 2018-10-22 DIAGNOSIS — E559 Vitamin D deficiency, unspecified: Secondary | ICD-10-CM | POA: Diagnosis not present

## 2018-10-22 DIAGNOSIS — Z0000 Encounter for general adult medical examination without abnormal findings: Secondary | ICD-10-CM

## 2018-10-22 DIAGNOSIS — E0590 Thyrotoxicosis, unspecified without thyrotoxic crisis or storm: Secondary | ICD-10-CM

## 2018-10-22 DIAGNOSIS — N83209 Unspecified ovarian cyst, unspecified side: Secondary | ICD-10-CM | POA: Insufficient documentation

## 2018-10-22 DIAGNOSIS — Z1389 Encounter for screening for other disorder: Secondary | ICD-10-CM

## 2018-10-22 DIAGNOSIS — G43709 Chronic migraine without aura, not intractable, without status migrainosus: Secondary | ICD-10-CM

## 2018-10-22 DIAGNOSIS — E162 Hypoglycemia, unspecified: Secondary | ICD-10-CM

## 2018-10-22 DIAGNOSIS — E161 Other hypoglycemia: Secondary | ICD-10-CM

## 2018-10-22 DIAGNOSIS — R55 Syncope and collapse: Secondary | ICD-10-CM

## 2018-10-22 DIAGNOSIS — Z1159 Encounter for screening for other viral diseases: Secondary | ICD-10-CM

## 2018-10-22 DIAGNOSIS — Z0184 Encounter for antibody response examination: Secondary | ICD-10-CM

## 2018-10-22 DIAGNOSIS — E0500 Thyrotoxicosis with diffuse goiter without thyrotoxic crisis or storm: Secondary | ICD-10-CM | POA: Diagnosis not present

## 2018-10-22 DIAGNOSIS — Z13220 Encounter for screening for lipoid disorders: Secondary | ICD-10-CM

## 2018-10-22 NOTE — Progress Notes (Addendum)
Virtual Visit via Video Note  I connected with Ruth Tran   on 10/22/18 at 11:00 AM EDT by a video enabled telemedicine application and verified that I am speaking with the correct person using two identifiers.  Location patient: work  Environmental manager or home office Persons participating in the virtual visit: patient, provider  I discussed the limitations of evaluation and management by telemedicine and the availability of in person appointments. The patient expressed understanding and agreed to proceed.   HPI: 1. Migraines -effexor 75 mg xr is giving her nausea and she thinks caused her glucose to drop w/o skipping meals reviewed side effects and low glucose is not side effect of effexor. She tried taking the medication at night but did not make a difference could not tolerate it.  She has not had Emgality filled yet by her pharmacy  2. Right ovarian cyst removed 08/12/2018 neg pathology she reports in the future general surgery will have to regraft the area where she had her appendix removed pain occurs more with menses monthly   ROS: See pertinent positives and negatives per HPI.  Past Medical History:  Diagnosis Date  . Allergy   . Chicken pox   . Complication of anesthesia    WITH HER WISDOM TEETH SURGERY SHE HAD TO GO TO THE OR TO HAVE THIS DONE DUE TO TACHYCARDIA DUE TO GRAVES DISEASE  . Complication of anesthesia   . Dysmenorrhea   . Family history of endometriosis    mother,sister  . Graves disease    graves, hyperthyroidism   . Headache    MIGRAINES  . Migraine   . Ovarian cyst   . Panic attack   . UTI (urinary tract infection)     Past Surgical History:  Procedure Laterality Date  . APPENDECTOMY  2005   WITH DRAIN PLACED DUE TO ABSCESS 2005 [facility]  . BREAST BIOPSY Right 2017   had a reaction to xylo or xylo with epi where her throat closed up  . CHROMOPERTUBATION Right 08/12/2018   Procedure: CHROMOPERTUBATION;  Surgeon: Benjaman Kindler, MD;  Location:  [facility] OR[facility];  Service: Gynecology;  Laterality: Right;  . FOOT SURGERY Bilateral 2008   toes going outward surgery to turn toes inward 2010   . LAPAROSCOPIC OVARIAN CYSTECTOMY Right 08/12/2018   Procedure: LAPAROSCOPIC OVARIAN CYSTECTOMY;  Surgeon: Benjaman Kindler, MD;  Location: [facility] OR[facility];  Service: Gynecology;  Laterality: Right;  . LYSIS OF ADHESION Right 08/12/2018   Procedure: LYSIS OF ADHESIONS Lysis of Omental Adhesions, right cecum adhesions, right fallopian tube and right adnexal adhesions;  Surgeon: Benjaman Kindler, MD;  Location: [facility] OR[facility];  Service: Gynecology;  Laterality: Right;  . TONSILLECTOMY AND ADENOIDECTOMY N/A 04/17/2017   Procedure: TONSILLECTOMY AND POSSIBLE  ADENOIDECTOMY;  Surgeon: Beverly Gust, MD;  Location: [facility] OR[facility];  Service: ENT;  Laterality: N/A;  . WISDOM TOOTH EXTRACTION     2016    Family History  Problem Relation Age of Onset  . Graves' disease Other   . Diabetes Father   . Hyperlipidemia Father   . Graves' disease Maternal Grandfather   . Miscarriages / Stillbirths Maternal Grandfather   . Cancer Maternal Grandfather        prostate/bladder   . Ovarian cancer Paternal Grandmother   . Cancer Paternal Grandmother        brain  . Hypertension Mother   . Migraines Mother   . Learning disabilities Sister   . Cancer Maternal Grandmother        lung (  small cell) >liver>pancreas smoker   . Migraines Maternal Grandmother   . Cancer Paternal Grandfather        skin cancer   . Breast cancer Neg Hx   . Colon cancer Neg Hx   . Heart disease Neg Hx     SOCIAL HX:  Works for the [employer]  Wears seat belt,  Guns at home  Safe in relationship  Does not exercise  Caffeine: cup of soda or tea daily   Current Outpatient Medications:  .  cetirizine (ZYRTEC) 10 MG tablet, Take 1 tablet (10 mg total) by mouth at bedtime., Disp: 90 tablet, Rfl: 1 .  montelukast (SINGULAIR) 10 MG tablet, Take 1 tablet (10 mg total) by mouth at bedtime.,  Disp: 90 tablet, Rfl: 3 .  norgestimate-ethinyl estradiol (ORTHO-CYCLEN,SPRINTEC,PREVIFEM) 0.25-35 MG-MCG tablet, Take 1 tablet by mouth daily., Disp: , Rfl:  .  docusate sodium (COLACE) 100 MG capsule, Take 1 capsule (100 mg total) by mouth 2 (two) times daily. To keep stools soft, Disp: 30 capsule, Rfl: 0 .  gabapentin (NEURONTIN) 800 MG tablet, Take 1 tablet (800 mg total) by mouth at bedtime for 14 days. Take nightly for 3 days, then up to 14 days as needed, Disp: 14 tablet, Rfl: 0 .  Galcanezumab-gnlm (EMGALITY) 120 MG/ML SOAJ, Inject 120 mg into the skin every 30 (thirty) days. (Patient not taking: Reported on 10/22/2018), Disp: 1 pen, Rfl: 11 .  oxycodone (OXY-IR) 5 MG capsule, Take 1 capsule (5 mg total) by mouth every 6 (six) hours as needed for pain., Disp: 15 capsule, Rfl: 0 .  venlafaxine XR (EFFEXOR XR) 75 MG 24 hr capsule, Take 1 capsule (75 mg total) by mouth daily with breakfast. (Patient not taking: Reported on 10/22/2018), Disp: 30 capsule, Rfl: 11  EXAM:  VITALS per patient if applicable:  GENERAL: alert, oriented, appears well and in no acute distress  HEENT: atraumatic, conjunttiva clear, no obvious abnormalities on inspection of external nose and ears  NECK: normal movements of the head and neck  LUNGS: on inspection no signs of respiratory distress, breathing rate appears normal, no obvious gross SOB, gasping or wheezing  CV: no obvious cyanosis  MS: moves all visible extremities without noticeable abnormality  PSYCH/NEURO: pleasant and cooperative, no obvious depression or anxiety, speech and thought processing grossly intact  ASSESSMENT AND PLAN:  Discussed the following assessment and plan:  Graves disease - Plan: TSH, T4, free, T3, free Hyperthyroidism - Plan: TSH, T4, free, T3, free Hypoglycemia -if continues needs to work with endocrine rec tid eating with snacks in between   Chronic migraine without aura without status migrainosus, not intractable -not  yet filled Emgality  -will let neuro know unable to tolerate effexor xr 75 due to nausea to reviewed other opts with pt  -has been on nortriptyline, imitrex, nsaids in the past wo help   Vitamin D deficiency disease - Plan: Vitamin D (25 hydroxy) rec D3 5000 IU daily   HM -sch physical In future after labs   Never had flu shot  ? Date HPV Tdap utd  3/3hep B vaccines check hep B in future  STD check 03/2017 [facility] neg f/u with Dr. Leafy Ro  -pap 04/22/18 negative no HPV comment [facility]  Dentist Renato Battles Dr. Gabriel Carina notes reviewed 11/05/17 for Graves dx'ed 2005 tx'ed methimazole until 12/2013 TSH and T4 remained normal labs 2016, 2017, 2019 labs. Graves in remission though labs checked f/u based on labs  12/18/2018  pt contacted me about passing out rec she go to Urgent care of ED she talked to nurse at urgent care who told her they would do blood work and the MD wouldn't see her unless she passed out again  She is passing out 15 secs starting 12/17/18, 12/18/18 see note 12/18/18 she reports it happens 30 minutes to 3 hours after eating food  -disc with pt consider endocrine (Dr. Gabriel Carina) referral for hypoglycemia, postprandial hypoglycemia in the future. She is also established with neurology and will need to See Dr. Jaynee Eagles if this w/u is negative  -[facility] negative refer endocrine and neurology appt neurology upcoming but will need with md not NP or PA  Per neurology Dr. Jaynee Eagles 02/04/2019 Doesn't sound like her brief loss of consciusness (15 seconds then fine) are seizures or primary neurologic - could it be vaso vagal? Cardiac? Pending EEG and an MRI of the brain   Rec. Holter Monitor or Cardiology referral  Doesn't appear to be narcolepsy either. Her BP runs very low, we will performed some orthostatics today and she was fine. I advised increased fluids, no driving for 6 months to a year until etiology resolved  [place] Cards  referral placed for holter monitor and further w/u FH sister +POTS      I discussed the assessment and treatment plan with the patient. The patient was provided an opportunity to ask questions and all were answered. The patient agreed with the plan and demonstrated an understanding of the instructions.   The patient was advised to call back or seek an in-person evaluation if the symptoms worsen or if the condition fails to improve as anticipated.  Time spent 15 minutes  Delorise Jackson, MD

## 2018-11-18 ENCOUNTER — Other Ambulatory Visit: Payer: Self-pay

## 2018-12-18 ENCOUNTER — Encounter: Payer: Self-pay | Admitting: Internal Medicine

## 2018-12-19 ENCOUNTER — Telehealth: Payer: Managed Care, Other (non HMO)

## 2018-12-19 ENCOUNTER — Other Ambulatory Visit: Payer: Self-pay | Admitting: Internal Medicine

## 2018-12-19 ENCOUNTER — Telehealth: Payer: Self-pay

## 2018-12-19 ENCOUNTER — Encounter: Payer: Self-pay | Admitting: Internal Medicine

## 2018-12-23 ENCOUNTER — Other Ambulatory Visit: Payer: Self-pay

## 2018-12-23 ENCOUNTER — Other Ambulatory Visit: Payer: Managed Care, Other (non HMO)

## 2018-12-23 ENCOUNTER — Other Ambulatory Visit: Payer: Self-pay | Admitting: Family Medicine

## 2018-12-23 DIAGNOSIS — E161 Other hypoglycemia: Secondary | ICD-10-CM

## 2018-12-23 DIAGNOSIS — Z0000 Encounter for general adult medical examination without abnormal findings: Secondary | ICD-10-CM | POA: Diagnosis not present

## 2018-12-23 DIAGNOSIS — E0590 Thyrotoxicosis, unspecified without thyrotoxic crisis or storm: Secondary | ICD-10-CM

## 2018-12-23 DIAGNOSIS — Z0184 Encounter for antibody response examination: Secondary | ICD-10-CM | POA: Diagnosis not present

## 2018-12-23 DIAGNOSIS — E162 Hypoglycemia, unspecified: Secondary | ICD-10-CM | POA: Diagnosis not present

## 2018-12-23 DIAGNOSIS — Z1159 Encounter for screening for other viral diseases: Secondary | ICD-10-CM

## 2018-12-23 DIAGNOSIS — E0500 Thyrotoxicosis with diffuse goiter without thyrotoxic crisis or storm: Secondary | ICD-10-CM

## 2018-12-23 DIAGNOSIS — R55 Syncope and collapse: Secondary | ICD-10-CM | POA: Diagnosis not present

## 2018-12-23 DIAGNOSIS — Z1389 Encounter for screening for other disorder: Secondary | ICD-10-CM

## 2018-12-23 DIAGNOSIS — E559 Vitamin D deficiency, unspecified: Secondary | ICD-10-CM

## 2018-12-23 DIAGNOSIS — Z13220 Encounter for screening for lipoid disorders: Secondary | ICD-10-CM

## 2018-12-23 NOTE — Progress Notes (Addendum)
Final lab results for Visit 12/23/2018.         Will fax results to PCP at  - [phone]  Unable to [place] for patient. She has been sent to [facility] for her lab draw.

## 2018-12-23 NOTE — Addendum Note (Signed)
Addended by: Orland Mustard on: 12/23/2018 07:48 AM   Modules accepted: Orders

## 2018-12-24 LAB — VITAMIN D 25 HYDROXY (VIT D DEFICIENCY, FRACTURES): Vit D, 25-Hydroxy: 34.3 ng/mL (ref 30.0–100.0)

## 2018-12-24 LAB — URINALYSIS, ROUTINE W REFLEX MICROSCOPIC
Bilirubin, UA: NEGATIVE
Glucose, UA: NEGATIVE
Ketones, UA: NEGATIVE
Nitrite, UA: NEGATIVE
Protein,UA: NEGATIVE
RBC, UA: NEGATIVE
Specific Gravity, UA: 1.019 (ref 1.005–1.030)
Urobilinogen, Ur: 0.2 mg/dL (ref 0.2–1.0)
pH, UA: 5 (ref 5.0–7.5)

## 2018-12-24 LAB — MICROSCOPIC EXAMINATION
Casts: NONE SEEN /lpf
Epithelial Cells (non renal): >10 /hpf — AB (ref 0–10)

## 2018-12-25 ENCOUNTER — Encounter: Payer: Self-pay | Admitting: Internal Medicine

## 2018-12-27 ENCOUNTER — Telehealth: Payer: Self-pay | Admitting: *Deleted

## 2018-12-30 ENCOUNTER — Other Ambulatory Visit: Payer: Self-pay | Admitting: Internal Medicine

## 2018-12-30 ENCOUNTER — Encounter: Payer: Self-pay | Admitting: Internal Medicine

## 2018-12-30 DIAGNOSIS — Z8639 Personal history of other endocrine, nutritional and metabolic disease: Secondary | ICD-10-CM

## 2018-12-30 DIAGNOSIS — R946 Abnormal results of thyroid function studies: Secondary | ICD-10-CM

## 2018-12-30 DIAGNOSIS — E161 Other hypoglycemia: Secondary | ICD-10-CM

## 2018-12-30 DIAGNOSIS — R55 Syncope and collapse: Secondary | ICD-10-CM

## 2019-01-05 LAB — CBC WITH DIFFERENTIAL/PLATELET
Basophils Absolute: 0 10*3/uL (ref 0.0–0.2)
Basos: 0 %
EOS (ABSOLUTE): 0 10*3/uL (ref 0.0–0.4)
Eos: 0 %
Hematocrit: 39.9 % (ref 34.0–46.6)
Hemoglobin: 13.3 g/dL (ref 11.1–15.9)
Immature Grans (Abs): 0 10*3/uL (ref 0.0–0.1)
Immature Granulocytes: 0 %
Lymphocytes Absolute: 1.7 10*3/uL (ref 0.7–3.1)
Lymphs: 22 %
MCH: 28.7 pg (ref 26.6–33.0)
MCHC: 33.3 g/dL (ref 31.5–35.7)
MCV: 86 fL (ref 79–97)
Monocytes Absolute: 0.7 10*3/uL (ref 0.1–0.9)
Monocytes: 9 %
Neutrophils Absolute: 5.1 10*3/uL (ref 1.4–7.0)
Neutrophils: 69 %
Platelets: 178 10*3/uL (ref 150–450)
RBC: 4.63 x10E6/uL (ref 3.77–5.28)
RDW: 11.7 % (ref 11.7–15.4)
WBC: 7.5 10*3/uL (ref 3.4–10.8)

## 2019-01-05 LAB — LIPID PANEL W/O CHOL/HDL RATIO
Cholesterol, Total: 190 mg/dL (ref 100–199)
HDL: 66 mg/dL (ref >39)
LDL Calculated: 100 mg/dL — ABNORMAL HIGH (ref 0–99)
Triglycerides: 120 mg/dL (ref 0–149)
VLDL Cholesterol Cal: 24 mg/dL (ref 5–40)

## 2019-01-05 LAB — HEPATITIS B SURFACE ANTIBODY,QUALITATIVE: Hep B Surface Ab, Qual: REACTIVE

## 2019-01-05 LAB — COMPREHENSIVE METABOLIC PANEL
ALT: 14 IU/L (ref 0–32)
AST: 24 IU/L (ref 0–40)
Albumin/Globulin Ratio: 2.1 (ref 1.2–2.2)
Albumin: 4.2 g/dL (ref 3.9–5.0)
Alkaline Phosphatase: 72 IU/L (ref 39–117)
BUN/Creatinine Ratio: 15 (ref 9–23)
BUN: 9 mg/dL (ref 6–20)
Bilirubin Total: 0.6 mg/dL (ref 0.0–1.2)
CO2: 23 mmol/L (ref 20–29)
Calcium: 9 mg/dL (ref 8.7–10.2)
Chloride: 102 mmol/L (ref 96–106)
Creatinine, Ser: 0.62 mg/dL (ref 0.57–1.00)
GFR calc Af Amer: 145 mL/min/{1.73_m2} (ref >59)
GFR calc non Af Amer: 126 mL/min/{1.73_m2} (ref >59)
Globulin, Total: 2 g/dL (ref 1.5–4.5)
Glucose: 79 mg/dL (ref 65–99)
Potassium: 4.4 mmol/L (ref 3.5–5.2)
Sodium: 142 mmol/L (ref 134–144)
Total Protein: 6.2 g/dL (ref 6.0–8.5)

## 2019-01-05 LAB — TSH: TSH: 0.192 u[IU]/mL — ABNORMAL LOW (ref 0.450–4.500)

## 2019-01-05 LAB — SULFONYLUREA HYPOGLYCEMICS PANEL, SERUM
Acetohexamide: NEGATIVE ug/mL (ref 20–60)
Chlorpropamide: NEGATIVE ug/mL (ref 75–250)
Glimepiride: NEGATIVE ng/mL (ref 80–250)
Glipizide: NEGATIVE ng/mL (ref 200–1000)
Glyburide: NEGATIVE ng/mL
Nateglinide: NEGATIVE ng/mL
Repaglinide: NEGATIVE ng/mL
Tolazamide: NEGATIVE ug/mL
Tolbutamide: NEGATIVE ug/mL (ref 40–100)

## 2019-01-05 LAB — BETA-HYDROXYBUTYRIC ACID: Beta-Hydroxybutyrate: 2 mg/dL

## 2019-01-05 LAB — T4, FREE: Free T4: 1.44 ng/dL (ref 0.82–1.77)

## 2019-01-05 LAB — PROINSULIN: Proinsulin: 3.1 pmol/L (ref 0.0–10.0)

## 2019-01-05 LAB — INSULIN AND C-PEPTIDE, SERUM
C-Peptide: 2.7 ng/mL (ref 1.1–4.4)
INSULIN: 22.2 u[IU]/mL (ref 2.6–24.9)

## 2019-01-05 LAB — T3 UPTAKE: T3 Uptake Ratio: 21 % — ABNORMAL LOW (ref 24–39)

## 2019-01-05 LAB — CORTISOL: Cortisol: 21.3 ug/dL

## 2019-01-05 LAB — INSULIN ANTIBODIES, BLOOD: Insulin AutoAb: <5 uU/mL

## 2019-01-07 NOTE — Telephone Encounter (Signed)
Opened in error

## 2019-01-13 ENCOUNTER — Encounter: Payer: Self-pay | Admitting: Internal Medicine

## 2019-01-15 ENCOUNTER — Encounter: Payer: Self-pay | Admitting: Internal Medicine

## 2019-01-19 ENCOUNTER — Encounter: Payer: Self-pay | Admitting: Internal Medicine

## 2019-01-20 ENCOUNTER — Ambulatory Visit (INDEPENDENT_AMBULATORY_CARE_PROVIDER_SITE_OTHER)
Admission: RE | Admit: 2019-01-20 | Discharge: 2019-01-20 | Disposition: A | Discharge status: Home / Self Care | Payer: Managed Care, Other (non HMO) | Source: Ambulatory Visit

## 2019-01-20 DIAGNOSIS — J029 Acute pharyngitis, unspecified: Secondary | ICD-10-CM

## 2019-01-20 MED ORDER — AMOXICILLIN 500 MG PO CAPS: 1000.0000 mg | ORAL_CAPSULE | Freq: Every day | ORAL | 0 refills | Status: AC

## 2019-01-20 NOTE — Discharge Instructions (Addendum)
Treating you for possibility of strep throat.  This could be viral Take the medication as prescribed.  You can take ibuprofen or tylenol for pain, fever.  Warm salt water gargles .  Follow up as needed for continued or worsening symptoms

## 2019-01-20 NOTE — ED Provider Notes (Signed)
Virtual Visit via Video Note:  [name]  initiated request for Telemedicine visit with [name] team. I connected with Ruth Tran  on 01/20/2019 at 9:52 AM  for a synchronized telemedicine visit using a video enabled HIPPA compliant telemedicine application. I verified that I am speaking with Ruth Tran  using two identifiers. Orvan July, NP  was physically located in a [facility] Urgent care site and PRICELLA VOSKUIL was located at a different location.   The limitations of evaluation and management by telemedicine as well as the availability of in-person appointments were discussed. Patient was informed that she  may incur a bill ( including co-pay) for this virtual visit encounter. Ruth Tran  expressed understanding and gave verbal consent to proceed with virtual visit.     History of Present Illness:Ruth Tran  is a 28 y.o. female presents with worsening sore throat over the past 3 days.  Reporting pain with swelling, severe redness and white spots to the back of her throat.  She does have history of tonsillectomy.  Last known strep infection was back in 2009.  Denies any associated fevers.  She does have seasonal allergies and takes daily allergy medication.  She has been taking this medication consistently.  Reporting this does not feel like her allergies.  No other symptoms to include nasal congestion, rhinorrhea, cough, chest congestion.  No recent sick contacts or recent traveling.  No known COVID exposures.  Past Medical History:  Diagnosis Date  . Allergy   . Chicken pox   . Complication of anesthesia    WITH HER WISDOM TEETH SURGERY SHE HAD TO GO TO THE OR TO HAVE THIS DONE DUE TO TACHYCARDIA DUE TO GRAVES DISEASE  . Complication of anesthesia   . Dysmenorrhea   . Family history of endometriosis    mother,sister  . Graves disease    graves, hyperthyroidism   . Headache    MIGRAINES  . Migraine   . Ovarian cyst   .  Panic attack   . UTI (urinary tract infection)     Allergies  Allergen Reactions  . Lidocaine Anaphylaxis  . Lidocaine-Epinephrine Anaphylaxis  . Toradol [Ketorolac Tromethamine] Anaphylaxis and Rash    Throat swelling and feeling of heat over whole body.  . Tramadol Anaphylaxis  . Latex Rash  . Linzess [Linaclotide]     Ab cramps   . Tape Rash        Observations/Objective: VITALS: Per patient if applicable, see vitals. GENERAL: Alert, appears well and in no acute distress. HEENT: Atraumatic, conjunctiva clear, no obvious abnormalities on inspection of external nose and ears. NECK: Normal movements of the head and neck. CARDIOPULMONARY: No increased WOB. Speaking in clear sentences. I:E ratio WNL.  MS: Moves all visible extremities without noticeable abnormality. PSYCH: Pleasant and cooperative, well-groomed. Speech normal rate and rhythm. Affect is appropriate. Insight and judgement are appropriate. Attention is focused, linear, and appropriate.  NEURO: CN grossly intact. Oriented as arrived to appointment on time with no prompting. Moves both UE equally.  SKIN: No obvious lesions, wounds, erythema, or cyanosis noted on face or hands.     Assessment and Plan: Treating for possibility of bacterial strep with amoxicillin.  Recommended ibuprofen as needed for pain.  Warm salt water gargles.  Recommended continue the allergy medication.   Follow Up Instructions: Follow-up in person for any continued or worsening problems.    I discussed the assessment and treatment plan with  the patient. The patient was provided an opportunity to ask questions and all were answered. The patient agreed with the plan and demonstrated an understanding of the instructions.   The patient was advised to call back or seek an in-person evaluation if the symptoms worsen or if the condition fails to improve as anticipated.      Orvan July, NP  01/20/2019 9:52 AM         Orvan July,  NP 01/20/19 1015

## 2019-02-04 ENCOUNTER — Other Ambulatory Visit: Payer: Self-pay

## 2019-02-04 ENCOUNTER — Ambulatory Visit: Payer: Managed Care, Other (non HMO) | Admitting: Neurology

## 2019-02-04 ENCOUNTER — Encounter: Payer: Self-pay | Admitting: Internal Medicine

## 2019-02-04 ENCOUNTER — Other Ambulatory Visit: Payer: Self-pay | Admitting: Internal Medicine

## 2019-02-04 ENCOUNTER — Encounter: Payer: Self-pay | Admitting: Neurology

## 2019-02-04 VITALS — BP 120/58 | HR 68 | Temp 97.8°F | Ht 65.0 in | Wt 196.0 lb

## 2019-02-04 DIAGNOSIS — R55 Syncope and collapse: Secondary | ICD-10-CM

## 2019-02-04 DIAGNOSIS — R419 Unspecified symptoms and signs involving cognitive functions and awareness: Secondary | ICD-10-CM
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
## 2019-02-04 NOTE — Patient Instructions (Signed)
- Doesn't sound like seizures or primary neurologic - vaso vagal? Cardiac? Will suggest Dr. Terese Door consider cardiology referral or holter moniter - TSH 0.192 11/2018 - follow up with Dr. Terese Door - MRI of the brain and eeg - Per [facility] statutes, patients with unexplained loss of consciousness are not allowed to drive until they have been event -free for six months.    Use caution when using heavy equipment or power tools. Avoid working on ladders or at heights. Take showers instead of baths. Ensure the water temperature is not too high on the home water heater. Do not go swimming alone. Do not lock yourself in a room alone (i.e. bathroom). When caring for infants or small children, sit down when holding, feeding, or changing them to minimize risk of injury to the child in the event you have a seizure. Maintain good sleep hygiene. Avoid alcohol.   Syncope  Syncope refers to a condition in which a person temporarily loses consciousness. Syncope may also be called fainting or passing out. It is caused by a sudden decrease in blood flow to the brain. Even though most causes of syncope are not dangerous, syncope can be a sign of a serious medical problem. Your health care provider may do tests to find the reason why you are having syncope. Signs that you may be about to faint include:  Feeling dizzy or light-headed.  Feeling nauseous.  Seeing all white or all black in your field of vision.  Having cold, clammy skin. If you faint, get medical help right away. Call your local emergency services (911 in the [place] Do not drive yourself to the hospital. Follow these instructions at home: Pay attention to any changes in your symptoms. Take these actions to stay safe and to help relieve your symptoms: Lifestyle  Do not drive, use machinery, or play sports until your health care provider says it is okay.  Do not drink alcohol.  Do not use any products that contain nicotine  or tobacco, such as cigarettes and e-cigarettes. If you need help quitting, ask your health care provider.  Drink enough fluid to keep your urine pale yellow. General instructions  Take over-the-counter and prescription medicines only as told by your health care provider.  If you are taking blood pressure or heart medicine, get up slowly and take several minutes to sit and then stand. This can reduce dizziness or light-headedness.  Have someone stay with you until you feel stable.  If you start to feel like you might faint, lie down right away and raise (elevate) your feet above the level of your heart. Breathe deeply and steadily. Wait until all the symptoms have passed.  Keep all follow-up visits as told by your health care provider. This is important. Get help right away if you:  Have a severe headache.  Faint once or repeatedly.  Have pain in your chest, abdomen, or back.  Have a very fast or irregular heartbeat (palpitations).  Have pain when you breathe.  Are bleeding from your mouth or rectum, or you have black or tarry stool.  Have a seizure.  Are confused.  Have trouble walking.  Have severe weakness.  Have vision problems. These symptoms may represent a serious problem that is an emergency. Do not wait to see if your symptoms will go away. Get medical help right away. Call your local emergency services (911 in the [place] Do not drive yourself to the hospital. Summary  Syncope refers to a condition in  which a person temporarily loses consciousness. It is caused by a sudden decrease in blood flow to the brain.  Signs that you may be about to faint include dizziness, feeling light-headed, feeling nauseous, sudden vision changes, or cold, clammy skin.  Although most causes of syncope are not dangerous, syncope can be a sign of a serious medical problem. If you faint, get medical help right away. This information is not intended to replace advice given to you by your  health care provider. Make sure you discuss any questions you have with your health care provider. Document Released: 05/15/2005 Document Revised: 04/27/2017 Document Reviewed: 04/23/2017 Elsevier Patient Education  2020 [employer].

## 2019-02-05 ENCOUNTER — Telehealth: Payer: Self-pay | Admitting: Neurology

## 2019-02-05 ENCOUNTER — Ambulatory Visit: Payer: Managed Care, Other (non HMO) | Admitting: Family Medicine

## 2019-02-05 NOTE — Telephone Encounter (Signed)
[employer] order sent to GI. They will obtain the auth and reach out to the patient to schedule.  

## 2019-02-13 ENCOUNTER — Telehealth: Payer: Self-pay

## 2019-02-16 ENCOUNTER — Other Ambulatory Visit: Payer: Self-pay

## 2019-02-16 ENCOUNTER — Emergency Department
Admission: EM | Admit: 2019-02-16 | Discharge: 2019-02-17 | Disposition: A | Discharge status: Home / Self Care | Payer: Managed Care, Other (non HMO) | Attending: Emergency Medicine | Admitting: Emergency Medicine

## 2019-02-16 DIAGNOSIS — Z79899 Other long term (current) drug therapy: Secondary | ICD-10-CM | POA: Insufficient documentation

## 2019-02-16 DIAGNOSIS — Z91040 Latex allergy status: Secondary | ICD-10-CM | POA: Diagnosis not present

## 2019-02-16 DIAGNOSIS — R55 Syncope and collapse: Secondary | ICD-10-CM | POA: Diagnosis present

## 2019-02-16 DIAGNOSIS — R42 Dizziness and giddiness: Secondary | ICD-10-CM | POA: Diagnosis not present

## 2019-02-16 LAB — URINALYSIS, COMPLETE (UACMP) WITH MICROSCOPIC
Bilirubin Urine: NEGATIVE
Glucose, UA: NEGATIVE mg/dL
Hgb urine dipstick: NEGATIVE
Ketones, ur: NEGATIVE mg/dL
Nitrite: NEGATIVE
Protein, ur: NEGATIVE mg/dL
Specific Gravity, Urine: 1.008 (ref 1.005–1.030)
pH: 7 (ref 5.0–8.0)

## 2019-02-16 LAB — CBC WITH DIFFERENTIAL/PLATELET
Abs Immature Granulocytes: 0.01 10*3/uL (ref 0.00–0.07)
Basophils Absolute: 0 10*3/uL (ref 0.0–0.1)
Basophils Relative: 0 %
Eosinophils Absolute: 0.1 10*3/uL (ref 0.0–0.5)
Eosinophils Relative: 1 %
HCT: 39.2 % (ref 36.0–46.0)
Hemoglobin: 13 g/dL (ref 12.0–15.0)
Immature Granulocytes: 0 %
Lymphocytes Relative: 41 %
Lymphs Abs: 3.1 10*3/uL (ref 0.7–4.0)
MCH: 28.9 pg (ref 26.0–34.0)
MCHC: 33.2 g/dL (ref 30.0–36.0)
MCV: 87.1 fL (ref 80.0–100.0)
Monocytes Absolute: 0.6 10*3/uL (ref 0.1–1.0)
Monocytes Relative: 8 %
Neutro Abs: 3.7 10*3/uL (ref 1.7–7.7)
Neutrophils Relative %: 50 %
Platelets: 163 10*3/uL (ref 150–400)
RBC: 4.5 MIL/uL (ref 3.87–5.11)
RDW: 13.2 % (ref 11.5–15.5)
WBC: 7.5 10*3/uL (ref 4.0–10.5)
nRBC: 0 % (ref 0.0–0.2)

## 2019-02-16 LAB — BASIC METABOLIC PANEL
Anion gap: 9 (ref 5–15)
BUN: 13 mg/dL (ref 6–20)
CO2: 25 mmol/L (ref 22–32)
Calcium: 9.2 mg/dL (ref 8.9–10.3)
Chloride: 105 mmol/L (ref 98–111)
Creatinine, Ser: 0.58 mg/dL (ref 0.44–1.00)
GFR calc Af Amer: >60 mL/min (ref >60)
GFR calc non Af Amer: >60 mL/min (ref >60)
Glucose, Bld: 101 mg/dL — ABNORMAL HIGH (ref 70–99)
Potassium: 4 mmol/L (ref 3.5–5.1)
Sodium: 139 mmol/L (ref 135–145)

## 2019-02-16 LAB — POCT PREGNANCY, URINE: Preg Test, Ur: NEGATIVE

## 2019-02-16 MED ORDER — LACTATED RINGERS IV BOLUS: 1000.0000 mL | Freq: Once | INTRAVENOUS | Status: DC

## 2019-02-16 NOTE — ED Notes (Signed)
POC PREG NEGATIVE  

## 2019-02-16 NOTE — ED Triage Notes (Signed)
Pt assisted to wheelchair upon arrival by fiance; pt says she has had 3 syncopal episodes in the last 4 hours; is being followed by a neurologist in [place] for these episodes that started in July; pt says she has not had this many in such a short amount of time; says it almost feels like she's just nodding off to sleep, wakes and her arms feel heavy; pt says she had some sort of warning today before 2 episodes but not the third; she says she is supposed to have an MRI of her brain scheduled as well as an appointment with a cardiologist-her neurologist wants her to wear a heart monitor; fiance says after pt wakes her speech is clear; pt arrives awake and alert; talking in complete coherent sentences;

## 2019-02-16 NOTE — ED Provider Notes (Signed)
[facility] Emergency Department Provider Note   ____________________________________________   First MD Initiated Contact with Patient 02/16/19 2143     (approximate)  I have reviewed the triage vital signs and the nursing notes.   HISTORY  Chief Complaint Loss of Consciousness    HPI Ruth Tran is a 28 y.o. female with past medical history of Graves' disease presents to the ED complaining of syncope.  Patient reports she has had 3 syncopal episodes in approximately the past 4 hours.  She states she will feel like she is "going into a black hole" and will start to feel lightheaded before passing out for a few seconds.  Her fianc has been present for all of these episodes and has caught her before she falls to the ground.  She has not hit her head and denies any headache.  She reports she has had similar episodes in the past for which she follows with neurology, has upcoming EEG and MRI scheduled.  She also reports that her mother and sister have a history of POTS with similar episodes.  She states she is otherwise been feeling well with no recent fevers, cough, chest pain, or shortness of breath.        Past Medical History:  Diagnosis Date  . Allergy   . Chicken pox   . Complication of anesthesia    WITH HER WISDOM TEETH SURGERY SHE HAD TO GO TO THE OR TO HAVE THIS DONE DUE TO TACHYCARDIA DUE TO GRAVES DISEASE  . Complication of anesthesia   . Dysmenorrhea   . Family history of endometriosis    mother,sister  . Graves disease    graves, hyperthyroidism   . Headache    MIGRAINES  . Migraine   . Ovarian cyst   . Panic attack   . UTI (urinary tract infection)     Patient Active Problem List   Diagnosis Date Noted  . Ovarian cyst 10/22/2018  . Chronic migraine without aura without status migrainosus, not intractable 04/01/2018  . Constipation 01/21/2018  . Migraine without aura and without status migrainosus, not intractable 09/19/2017   . Hyperthyroidism 09/19/2017  . Seasonal allergies 09/19/2017  . Graves disease 01/22/2014  . Vitamin D deficiency disease 01/22/2014    Past Surgical History:  Procedure Laterality Date  . APPENDECTOMY  2005   WITH DRAIN PLACED DUE TO ABSCESS 2005 [facility]  . BREAST BIOPSY Right 2017   had a reaction to xylo or xylo with epi where her throat closed up  . CHROMOPERTUBATION Right 08/12/2018   Procedure: CHROMOPERTUBATION;  Surgeon: Benjaman Kindler, MD;  Location: [facility] ORS;  Service: Gynecology;  Laterality: Right;  . FOOT SURGERY Bilateral 2008   toes going outward surgery to turn toes inward 2010   . LAPAROSCOPIC OVARIAN CYSTECTOMY Right 08/12/2018   Procedure: LAPAROSCOPIC OVARIAN CYSTECTOMY;  Surgeon: Benjaman Kindler, MD;  Location: [facility] ORS;  Service: Gynecology;  Laterality: Right;  . LYSIS OF ADHESION Right 08/12/2018   Procedure: LYSIS OF ADHESIONS Lysis of Omental Adhesions, right cecum adhesions, right fallopian tube and right adnexal adhesions;  Surgeon: Benjaman Kindler, MD;  Location: [facility] ORS;  Service: Gynecology;  Laterality: Right;  . TONSILLECTOMY AND ADENOIDECTOMY N/A 04/17/2017   Procedure: TONSILLECTOMY AND POSSIBLE  ADENOIDECTOMY;  Surgeon: Beverly Gust, MD;  Location: [facility] ORS;  Service: ENT;  Laterality: N/A;  . WISDOM TOOTH EXTRACTION     2016    Prior to Admission medications   Medication Sig Start Date End Date  Taking? Authorizing Provider  cetirizine (ZYRTEC) 10 MG tablet Take 1 tablet (10 mg total) by mouth at bedtime. 09/16/18   McLean-Scocuzza, [name], MD  montelukast (SINGULAIR) 10 MG tablet Take 1 tablet (10 mg total) by mouth at bedtime. 10/18/17   McLean-Scocuzza, [name], MD  norgestimate-ethinyl estradiol (ORTHO-CYCLEN,SPRINTEC,PREVIFEM) 0.25-35 MG-MCG tablet Take 1 tablet by mouth daily. 04/22/18   [provider]    Allergies Lidocaine, Lidocaine-epinephrine, Toradol [ketorolac tromethamine], Tramadol, Latex, Linzess [linaclotide], and  Tape  Family History  Problem Relation Age of Onset  . Graves' disease Other   . Diabetes Father   . Hyperlipidemia Father   . Graves' disease Maternal Grandfather   . Miscarriages / Stillbirths Maternal Grandfather   . Cancer Maternal Grandfather        prostate/bladder   . Ovarian cancer Paternal Grandmother   . Cancer Paternal Grandmother        brain  . Hypertension Mother   . Migraines Mother   . Learning disabilities Sister   . Cancer Maternal Grandmother        lung (small cell) >liver>pancreas smoker   . Migraines Maternal Grandmother   . Cancer Paternal Grandfather        skin cancer   . Breast cancer Neg Hx   . Colon cancer Neg Hx   . Heart disease Neg Hx     Social History Social History   Tobacco Use  . Smoking status: Never Smoker  . Smokeless tobacco: Never Used  Substance Use Topics  . Alcohol use: No    Alcohol/week: 0.0 standard drinks  . Drug use: No    Review of Systems  Constitutional: No fever/chills Eyes: No visual changes. ENT: No sore throat. Cardiovascular: Denies chest pain.  Positive for syncope. Respiratory: Denies shortness of breath. Gastrointestinal: No abdominal pain.  No nausea, no vomiting.  No diarrhea.  No constipation. Genitourinary: Negative for dysuria. Musculoskeletal: Negative for back pain. Skin: Negative for rash. Neurological: Negative for headaches, focal weakness or numbness.  ____________________________________________   PHYSICAL EXAM:  VITAL SIGNS: ED Triage Vitals  Enc Vitals Group     BP      Pulse      Resp      Temp      Temp src      SpO2      Weight      Height      Head Circumference      Peak Flow      Pain Score      Pain Loc      Pain Edu?      Excl. in [place]?     Constitutional: Alert and oriented. Eyes: Conjunctivae are normal. Head: Atraumatic. Nose: No congestion/rhinnorhea. Mouth/Throat: Mucous membranes are moist. Neck: Normal ROM Cardiovascular: Normal rate, regular rhythm.  Grossly normal heart sounds. Respiratory: Normal respiratory effort.  No retractions. Lungs CTAB. Gastrointestinal: Soft and nontender. No distention. Genitourinary: deferred Musculoskeletal: No lower extremity tenderness nor edema. Neurologic:  Normal speech and language. No gross focal neurologic deficits are appreciated. Skin:  Skin is warm, dry and intact. No rash noted. Psychiatric: Mood and affect are normal. Speech and behavior are normal.  ____________________________________________   LABS (all labs ordered are listed, but only abnormal results are displayed)  Labs Reviewed  URINALYSIS, COMPLETE (UACMP) WITH MICROSCOPIC - Abnormal; Notable for the following components:      Result Value   Color, Urine STRAW (*)    APPearance HAZY (*)  Leukocytes,Ua SMALL (*)    Bacteria, UA RARE (*)    All other components within normal limits  BASIC METABOLIC PANEL - Abnormal; Notable for the following components:   Glucose, Bld 101 (*)    All other components within normal limits  CBC WITH DIFFERENTIAL/PLATELET  POC URINE PREG, ED  POCT PREGNANCY, URINE   ____________________________________________  EKG  ED ECG REPORT I, Blake Divine, the attending physician, personally viewed and interpreted this ECG.   Date: 02/16/2019  EKG Time: 21:46  Rate: 87  Rhythm: normal sinus rhythm  Axis: Normal  Intervals:none  ST&T Change: None   PROCEDURES  Procedure(s) performed (including Critical Care):  Procedures   ____________________________________________   INITIAL IMPRESSION / ASSESSMENT AND PLAN / ED COURSE       28 year old female with history of prior syncopal episodes presents to the ED complaining of syncope.  She has not had any chest pain or shortness of breath associated with these episodes, initial EKG is unremarkable.  Low suspicion for cardiac etiology, will screen labs and urine pregnancy.  Urine pregnancy negative, UA unremarkable.  Electrolytes within  normal limits.  Patient had a single syncopal episode here in the ED while sitting on the stretcher, no evidence of arrhythmia during this time.  Patient is now feeling better after administration of IV fluids, she is appropriate for discharge home with outpatient follow-up.  Counseled to return to the ED for new or worsening symptoms, patient agrees with plan.      ____________________________________________   FINAL CLINICAL IMPRESSION(S) / ED DIAGNOSES  Final diagnoses:  Syncope, unspecified syncope type     ED Discharge Orders    None       Note:  This document was prepared using Dragon voice recognition software and may include unintentional dictation errors.   Blake Divine, MD 02/16/19 [phone]

## 2019-02-17 ENCOUNTER — Telehealth: Payer: Self-pay

## 2019-02-17 NOTE — Telephone Encounter (Signed)
Dr. Jaynee Eagles is going to [facility] message the pt.

## 2019-02-20 ENCOUNTER — Telehealth: Payer: Self-pay

## 2019-02-20 NOTE — Telephone Encounter (Signed)
Copied from [place] [id number]. Topic: General - Inquiry >> Feb 20, 2019 12:56 PM Scherrie Gerlach wrote: Reason for CRM: pt wants to know if her FMLA papers from [employer] have been received yet? It is for accommodations.

## 2019-02-20 NOTE — Telephone Encounter (Signed)
Sunday she passed out. Monday she had to call out per Dr. Needed to do LOA. Her work wants her to fill one out due to passing out at work. They don't want her to get in trouble for missing work if she had to go to hospital. She has a cardiology appointment coming up as well.

## 2019-02-20 NOTE — Telephone Encounter (Signed)
Have you received any?

## 2019-02-20 NOTE — Telephone Encounter (Signed)
Why does she need accomodation?    [place]

## 2019-02-21 NOTE — Telephone Encounter (Signed)
mychart message has been sent

## 2019-02-21 NOTE — Telephone Encounter (Signed)
Will get to this as soon as I can and let her know when done  Will be done by next week   [place]

## 2019-03-07 ENCOUNTER — Telehealth: Payer: Self-pay | Admitting: Internal Medicine

## 2019-03-07 NOTE — Telephone Encounter (Signed)
New Message:  Patient wanted to know if her upcoming appointment 10/22 could be done virtually or not. She will be out of town. If it is unable to be done virtually, she will need to reschedule. Please advise

## 2019-03-07 NOTE — Telephone Encounter (Signed)
Pt requested a virtual visit due to location. Appt type changed and pt messaged on MyChart.

## 2019-03-14 ENCOUNTER — Ambulatory Visit
Admission: RE | Admit: 2019-03-14 | Discharge: 2019-03-14 | Disposition: A | Discharge status: Home / Self Care | Payer: Self-pay | Source: Ambulatory Visit | Attending: Neurology | Admitting: Neurology

## 2019-03-14 ENCOUNTER — Other Ambulatory Visit: Payer: Self-pay

## 2019-03-14 DIAGNOSIS — R55 Syncope and collapse: Secondary | ICD-10-CM

## 2019-03-14 DIAGNOSIS — R419 Unspecified symptoms and signs involving cognitive functions and awareness: Secondary | ICD-10-CM

## 2019-03-14 MED ORDER — GADOBENATE DIMEGLUMINE 529 MG/ML IV SOLN
18.0000 mL | Freq: Once | INTRAVENOUS | Status: AC | PRN
  Administered 2019-03-14: 18 mL via INTRAVENOUS

## 2019-03-15 ENCOUNTER — Other Ambulatory Visit: Payer: Self-pay

## 2019-03-20 ENCOUNTER — Ambulatory Visit: Payer: Managed Care, Other (non HMO) | Admitting: Neurology

## 2019-03-20 ENCOUNTER — Other Ambulatory Visit: Payer: Self-pay

## 2019-03-20 ENCOUNTER — Encounter: Payer: Self-pay | Admitting: Internal Medicine

## 2019-03-20 ENCOUNTER — Telehealth (INDEPENDENT_AMBULATORY_CARE_PROVIDER_SITE_OTHER): Payer: 59 | Admitting: Internal Medicine

## 2019-03-20 ENCOUNTER — Telehealth: Payer: Self-pay | Admitting: Internal Medicine

## 2019-03-20 VITALS — Ht 65.0 in | Wt 190.0 lb

## 2019-03-20 DIAGNOSIS — R002 Palpitations: Secondary | ICD-10-CM | POA: Diagnosis not present

## 2019-03-20 DIAGNOSIS — R419 Unspecified symptoms and signs involving cognitive functions and awareness: Secondary | ICD-10-CM

## 2019-03-20 DIAGNOSIS — R55 Syncope and collapse: Secondary | ICD-10-CM

## 2019-03-20 NOTE — Telephone Encounter (Signed)
I called to [facility] and s/w Sherri P. RN in regards to pt calling about her appt which was supposed to be at 3:45 pm with Dr. Caryl Comes. Sherri asked if we could reach out to the pt and let her know that Dr. Caryl Comes is running behind however stay by the phone and they will be calling her shortly. I tried to reach pt to inform though no answer and VM not set up.

## 2019-03-20 NOTE — Telephone Encounter (Signed)
New message:    Patient calling she had  3:45 pm VV appt With Dr. Caryl Comes and she states she has not heard from anyone.

## 2019-03-20 NOTE — Progress Notes (Signed)
Electrophysiology TeleHealth Note   Due to national recommendations of social distancing due to COVID 19, an audio/video telehealth visit is felt to be most appropriate for this patient at this time.  See MyChart message from today for the patient's consent to telehealth for [facility].   Date:  03/20/2019   ID:  Ruth LAFORGIA, DOB 03/25/1991, MRN [id number]  Location: patient's home  Provider location: [address], [place]  Evaluation Performed: Initial Evaluation  PCP:  McLean-Scocuzza, [name], MD  Cardiologist:  No primary care provider on file. *  Electrophysiologist:  None   Chief Complaint:?  POTS  History of Present Illness:    Ruth Tran is a 28 y.o. female who presents via audio/video conferencing for a telehealth visit today for  Concerns regarding possible pots.  She describes the abrupt onset of syncopal spells in July 2020.  She has had since that time 40+ spells.  About 25% of them have occurred while lying down.  She is somewhat aware of a prodrome which she describes as "a black golf placing her, associated with auditory distancing.  She has fallen on one occasion.  All of the other occasions she has been caught.  She describes herself as pale but says she may be more pale.  There is some sensation of hot and cold.  There is no diaphoresis or nausea.  The duration of her unresponsiveness can be from 15 seconds to about 5 minutes.  She wakes with a "gasp for air ".  She describes herself as disoriented and confused and for some period of time afterwards can have short-term memory issues. She says she is described as having her eyes closed.  Her sister and her mother apparently carry a diagnosis of POTS.  On their recommendation she started increasing her salt and water intake.  She does not like salt.  Her fluid intake is increased.  These have been associated with no noticeable change.  Although she does say that she now has about 30  seconds of warning.  She has changed her showers from hot to warm based on her sister's recommendations.  She has had 1 "syncopal" episode in the shower where her boyfriend caught her and helped her lie down.  Symptoms are not aggravated by menses.  July marked the beginning of her new job at [employer]..  She is undergoing a neurological evaluation including an EEG and an MRI which are normal.  Reviewing Dr. Chong Sicilian note >>" She just passes out, didn't happen for a month, a month later she "went out" and yelled her name, she was arousable, no confusion"    Has a past medical history of migraines and Graves' disease The patient denies symptoms of fevers, chills, cough, or new SOB worrisome for COVID 19.    Past Medical History:  Diagnosis Date   Allergy    Chicken pox    Complication of anesthesia    WITH HER WISDOM TEETH SURGERY SHE HAD TO GO TO THE OR TO HAVE THIS DONE DUE TO TACHYCARDIA DUE TO GRAVES DISEASE   Complication of anesthesia    Dysmenorrhea    Family history of endometriosis    mother,sister   Graves disease    graves, hyperthyroidism    Headache    MIGRAINES   Migraine    Ovarian cyst    Panic attack    UTI (urinary tract infection)     Past Surgical History:  Procedure Laterality Date  APPENDECTOMY  2005   WITH DRAIN PLACED DUE TO ABSCESS 2005 [facility]   BREAST BIOPSY Right 2017   had a reaction to xylo or xylo with epi where her throat closed up   CHROMOPERTUBATION Right 08/12/2018   Procedure: CHROMOPERTUBATION;  Surgeon: Benjaman Kindler, MD;  Location: [facility] ORS;  Service: Gynecology;  Laterality: Right;   FOOT SURGERY Bilateral 2008   toes going outward surgery to turn toes inward 2010    LAPAROSCOPIC OVARIAN CYSTECTOMY Right 08/12/2018   Procedure: LAPAROSCOPIC OVARIAN CYSTECTOMY;  Surgeon: Benjaman Kindler, MD;  Location: [facility] ORS;  Service: Gynecology;  Laterality: Right;   LYSIS OF ADHESION Right 08/12/2018   Procedure: LYSIS OF  ADHESIONS Lysis of Omental Adhesions, right cecum adhesions, right fallopian tube and right adnexal adhesions;  Surgeon: Benjaman Kindler, MD;  Location: [facility] ORS;  Service: Gynecology;  Laterality: Right;   TONSILLECTOMY AND ADENOIDECTOMY N/A 04/17/2017   Procedure: TONSILLECTOMY AND POSSIBLE  ADENOIDECTOMY;  Surgeon: Ruth Gust, MD;  Location: [facility] ORS;  Service: ENT;  Laterality: N/A;   WISDOM TOOTH EXTRACTION     2016    Current Outpatient Medications  Medication Sig Dispense Refill   cetirizine (ZYRTEC) 10 MG tablet Take 1 tablet (10 mg total) by mouth at bedtime. 90 tablet 1   montelukast (SINGULAIR) 10 MG tablet Take 1 tablet (10 mg total) by mouth at bedtime. 90 tablet 3   norgestimate-ethinyl estradiol (ORTHO-CYCLEN,SPRINTEC,PREVIFEM) 0.25-35 MG-MCG tablet Take 1 tablet by mouth daily.     No current facility-administered medications for this visit.     Allergies:   Lidocaine, Lidocaine-epinephrine, Toradol [ketorolac tromethamine], Tramadol, Latex, Linzess [linaclotide], and Tape   Social History:  The patient  reports that she has never smoked. She has never used smokeless tobacco. She reports that she does not drink alcohol or use drugs.   Family History:  The patient's   family history includes Cancer in her maternal grandfather, maternal grandmother, paternal grandfather, and paternal grandmother; Diabetes in her father; Berenice Primas' disease in her maternal grandfather and another family member; Hyperlipidemia in her father; Hypertension in her mother; Learning disabilities in her sister; Migraines in her maternal grandmother and mother; Miscarriages / Stillbirths in her maternal grandfather; Ovarian cancer in her paternal grandmother.   ROS:  Please see the history of present illness.   All other systems are personally reviewed and negative.    Exam:    Vital Signs:  Ht 5\' 5"  (1.651 m)    Wt 190 lb (86.2 kg)    BMI 31.62 kg/m     Well appearing, alert and  conversant, regular work of breathing,  good skin color Eyes- anicteric, neuro- grossly intact, skin- no apparent rash or lesions or cyanosis, mouth- oral mucosa is pink   Labs/Other Tests and Data Reviewed:    Recent Labs: 12/23/2018: ALT 14; TSH 0.192 02/16/2019: BUN 13; Creatinine, Ser 0.58; Hemoglobin 13.0; Platelets 163; Potassium 4.0; Sodium 139   Wt Readings from Last 3 Encounters:  03/20/19 190 lb (86.2 kg)  02/16/19 190 lb (86.2 kg)  02/04/19 196 lb (88.9 kg)     Other studies personally reviewed: Additional studies/ records that were reviewed today include As above      ASSESSMENT & PLAN:   Spells//syncope  Migraines  The patient has frequent spells which are described as loss of consciousness.  They confuse me.  First, her description of the events to me today is quite different from her description of them to Dr. Richardson Landry, bothersome components to suggest  autonomic issues--recovery fatigue which is 1 of those discordant elements in her description, residual orthostatic intolerance which is also discordant, prodromal symptoms, suggestions of heat intolerance.  On the other hand, the fact that 25% of her spells have occurred while lying flat associated on at least one occasion with awakening and having another spell, and most bothersome only that her eyes are closed during these events suggests a nonsyncopal event.  I try to get at possibilities of psychosocial stressors coincidental with the onset of her symptoms; did find out that she is started new job,  I told her that to the degree that there is an autonomic component, that salt repletion would be helpful and offered her either salt tablets or rehydrating solutions like Pedialyte advance care/liquid IV.  Given the frequency of the events, and to exclude an arrhythmia, suggested we might try a 7-day Zio patch monitor.  However, I expect it will be normal again the closed eyes suggesting that this is not  syncope.  COVID 19 screen The patient denies symptoms of COVID 19 at this time.  The importance of social distancing was discussed today.  Follow-up:   prn Next remote:   Current medicines are reviewed at length with the patient today.   The patient does not have concerns regarding her medicines.  The following changes were made today:  none  Labs/ tests ordered today include:  ZIO patch x 7 d No orders of the defined types were placed in this encounter.   Future tests ( post COVID )     Patient Risk:  after full review of this patients clinical status, I feel that they are at moderate risk at this time.  Today, I have spent 17 minutes with the patient with telehealth technology discussing   Signed, Virl Axe, MD  03/20/2019 4:32 PM     [place] [address] [phone] (office) [phone] (fax)

## 2019-03-20 NOTE — Procedures (Signed)
    History:  Ruth Tran is a 28 year old patient with a history of migraine headache.  The patient has had an event of unresponsiveness, this was recurring several times in a single day.  The patient has no jerking or twitching with the events, heart rate in the 130s.  The patient is being evaluated for possible seizures.  This is a routine EEG.  No skull defects are noted.  Medications include Zyrtec, Singulair, and Sprintec.  EEG classification: Normal awake  Description of the recording: The background rhythms of this recording consists of a fairly well modulated medium amplitude alpha rhythm of 8 Hz that is reactive to eye opening and closure. As the record progresses, the patient appears to remain in the waking state throughout the recording. Photic stimulation was performed, resulting in a bilateral and symmetric photic driving response. Hyperventilation was also performed, resulting in a minimal buildup of the background rhythm activities without significant slowing seen. At no time during the recording does there appear to be evidence of spike or spike wave discharges or evidence of focal slowing. EKG monitor shows no evidence of cardiac rhythm abnormalities with a heart rate of 66.  Impression: This is a normal EEG recording in the waking state. No evidence of ictal or interictal discharges are seen.

## 2019-03-21 ENCOUNTER — Telehealth: Payer: Self-pay | Admitting: Neurology

## 2019-03-21 DIAGNOSIS — R404 Transient alteration of awareness: Secondary | ICD-10-CM

## 2019-03-21 NOTE — Telephone Encounter (Signed)
This is ok thanks for the update   [place]

## 2019-03-21 NOTE — Telephone Encounter (Signed)
Patient's eeg was normal. She saw Dr. Caryl Comes as well yesterday in Cardiology. I spoke to our wonderful colleague Dr. Caryl Comes today and he agree that she would benefit from long term eeg monitoring where she can also have cardiac monitoring to hopefully catch an event.  Since we have the new eeg monitoring unit inpatient, I wonder if she would be a great referral since she keeps losing consciousness without explanation. I will place Dr. Hortense Ramal on this message.  Romelle Starcher, would you call patient and advise her EEG in the office was normal  And ask if she would consent to this referral? We did discuss this next step in our appointment so she should be aware.

## 2019-03-21 NOTE — Addendum Note (Signed)
Addended by: Stanton Kidney on: 03/21/2019 09:40 AM   Modules accepted: Orders

## 2019-03-24 ENCOUNTER — Other Ambulatory Visit: Payer: Self-pay | Admitting: Neurology

## 2019-03-24 DIAGNOSIS — R404 Transient alteration of awareness: Secondary | ICD-10-CM

## 2019-03-24 NOTE — Telephone Encounter (Signed)
Called pt on mobile #, LVM asking for call back. Called pt on home #, no answer and vm was not setup.

## 2019-03-24 NOTE — Telephone Encounter (Signed)
No let's get her referred for inpatient EEG monitoring here at [facility]

## 2019-03-25 ENCOUNTER — Encounter: Payer: Self-pay | Admitting: *Deleted

## 2019-03-25 NOTE — Telephone Encounter (Signed)
Spoke with pt. She understands EEG is normal. She is amenable to be referred to the EEG unit at [facility]. 11/2 appt with Amy canceled pending this further testing. She verbalized appreciation for the call.

## 2019-03-25 NOTE — Telephone Encounter (Signed)
Called patient I was not able to leave a voice mail I wanted to relay to her that Levada Dy from [facility] [place] would be calling her to schedule apt. [phone]

## 2019-03-25 NOTE — Telephone Encounter (Signed)
Sent pt a mychart message. 

## 2019-03-26 NOTE — Addendum Note (Signed)
Addended by: Gildardo Griffes on: 03/26/2019 03:05 PM   Modules accepted: Orders

## 2019-03-26 NOTE — Telephone Encounter (Signed)
Pt responded to message. She will await the call from [facility] scheduler. I also updated her primary contact # in chart.

## 2019-03-26 NOTE — Telephone Encounter (Signed)
EMU reached out via epic and stated that they needed an order placed in epic for this referral. They do not see one.

## 2019-03-26 NOTE — Telephone Encounter (Signed)
I spoke with Levada Dy @ [facility] [phone]) and confirmed the order should be placed as referral to neurology.

## 2019-03-26 NOTE — Addendum Note (Signed)
Addended by: Gildardo Griffes on: 03/26/2019 11:31 AM   Modules accepted: Orders

## 2019-03-27 NOTE — Telephone Encounter (Signed)
Noted, thank you. DW  °

## 2019-03-31 ENCOUNTER — Ambulatory Visit: Payer: Managed Care, Other (non HMO) | Admitting: Family Medicine

## 2019-04-01 ENCOUNTER — Other Ambulatory Visit: Payer: Self-pay

## 2019-04-02 ENCOUNTER — Encounter: Payer: Self-pay | Admitting: Internal Medicine

## 2019-04-07 ENCOUNTER — Encounter: Payer: Self-pay | Admitting: Internal Medicine

## 2019-04-07 ENCOUNTER — Telehealth: Payer: Self-pay

## 2019-04-07 DIAGNOSIS — J302 Other seasonal allergic rhinitis: Secondary | ICD-10-CM

## 2019-04-07 NOTE — Telephone Encounter (Signed)
Spoke to pt, went over monitor instructions. Verified address. 7 day ZIO ordered.

## 2019-04-08 MED ORDER — CETIRIZINE HCL 10 MG PO TABS: 10.0000 mg | ORAL_TABLET | Freq: Every day | ORAL | 1 refills | Status: DC

## 2019-04-08 MED ORDER — MONTELUKAST SODIUM 10 MG PO TABS: 10.0000 mg | ORAL_TABLET | Freq: Every day | ORAL | 3 refills | Status: DC

## 2019-04-15 ENCOUNTER — Ambulatory Visit (INDEPENDENT_AMBULATORY_CARE_PROVIDER_SITE_OTHER): Payer: 59

## 2019-04-15 DIAGNOSIS — R002 Palpitations: Secondary | ICD-10-CM

## 2019-04-18 ENCOUNTER — Encounter: Payer: Self-pay | Admitting: Internal Medicine

## 2019-05-20 ENCOUNTER — Telehealth: Payer: Self-pay | Admitting: Internal Medicine

## 2019-05-20 NOTE — Telephone Encounter (Signed)
Patient needs a work excuse letting her work know that she is allergic to latex.

## 2019-05-21 ENCOUNTER — Encounter: Payer: Self-pay | Admitting: Internal Medicine

## 2019-05-21 NOTE — Telephone Encounter (Signed)
Letter in mychart

## 2019-05-28 NOTE — Telephone Encounter (Signed)
consented

## 2019-05-29 ENCOUNTER — Encounter: Payer: Self-pay | Admitting: Internal Medicine

## 2019-06-25 ENCOUNTER — Encounter: Payer: Self-pay | Admitting: Internal Medicine

## 2019-06-26 ENCOUNTER — Ambulatory Visit (INDEPENDENT_AMBULATORY_CARE_PROVIDER_SITE_OTHER)
Admission: RE | Admit: 2019-06-26 | Discharge: 2019-06-26 | Disposition: A | Discharge status: Home / Self Care | Payer: BC Managed Care – PPO | Source: Ambulatory Visit

## 2019-06-26 ENCOUNTER — Other Ambulatory Visit: Payer: Self-pay

## 2019-06-26 DIAGNOSIS — J069 Acute upper respiratory infection, unspecified: Secondary | ICD-10-CM

## 2019-06-26 MED ORDER — FLUTICASONE PROPIONATE 50 MCG/ACT NA SUSP: 1.0000 | Freq: Every day | NASAL | 0 refills | Status: DC

## 2019-06-26 MED ORDER — PREDNISONE 20 MG PO TABS: 40.0000 mg | ORAL_TABLET | Freq: Every day | ORAL | 0 refills | Status: AC

## 2019-06-26 NOTE — Discharge Instructions (Signed)
Sinus symptoms still likely viral upper respiratory infection Begin prednisone 40 mg daily for 5 days- take with food in morning if able Flonase nasal spray 1-2 spray in each nostril daily Continue cetirizine, mucinex Follow up if not having any improvement with the above as well as with another 4-5 days

## 2019-06-26 NOTE — ED Provider Notes (Signed)
Virtual Visit via Video Note:  [name]  initiated request for Telemedicine visit with [facility] team. I connected with [name]  on 06/26/2019 at 6:13 PM  for a synchronized telemedicine visit using a video enabled HIPPA compliant telemedicine application. I verified that I am speaking with [name]  using two identifiers. [name] C [name], PA-C  was physically located in a [facility]gent care site and ARDIS STUDER was located at a different location.   The limitations of evaluation and management by telemedicine as well as the availability of in-person appointments were discussed. Patient was informed that she  may incur a bill ( including co-pay) for this virtual visit encounter. [name]  expressed understanding and gave verbal consent to proceed with virtual visit.     History of Present Illness:Ruth Tran  is a 29 y.o. female presents for evaluation of sinus pressure  On right side, feeling numb. Teeth hurting on top. Pressure on ears. Nasal congestion and drainage. Reports headaches. Began Monday, persisting for 4 days. COVID tested earlier this week which was negative. Minimal cough. No chest pain or shortness of breath. Fever on day 1, no persistent fever. OTC decongestants.  Denies GI symptoms.   Allergies  Allergen Reactions  . Lidocaine Anaphylaxis  . Lidocaine-Epinephrine Anaphylaxis  . Toradol [Ketorolac Tromethamine] Anaphylaxis and Rash    Throat swelling and feeling of heat over whole body.  . Tramadol Anaphylaxis  . Latex Rash  . Linzess [Linaclotide]     Ab cramps   . Tape Rash     Past Medical History:  Diagnosis Date  . Allergy   . Chicken pox   . Complication of anesthesia    WITH HER WISDOM TEETH SURGERY SHE HAD TO GO TO THE OR TO HAVE THIS DONE DUE TO TACHYCARDIA DUE TO GRAVES DISEASE  . Complication of anesthesia   . Dysmenorrhea   . Family history of endometriosis    mother,sister  .  Graves disease    graves, hyperthyroidism   . Headache    MIGRAINES  . Migraine   . Ovarian cyst   . Panic attack   . UTI (urinary tract infection)      Social History   Tobacco Use  . Smoking status: Never Smoker  . Smokeless tobacco: Never Used  Substance Use Topics  . Alcohol use: No    Alcohol/week: 0.0 standard drinks  . Drug use: No        Observations/Objective: Physical Exam  Constitutional: She is oriented to person, place, and time and well-developed, well-nourished, and in no distress. No distress.  HENT:  Head: Normocephalic and atraumatic.  Pulmonary/Chest: Effort normal. No respiratory distress.  Speaking in full sentences, no coughing during video visit  Musculoskeletal:     Cervical back: Normal range of motion.  Neurological: She is alert and oriented to person, place, and time.  Speech clear, face symmetric     Assessment and Plan:    ICD-10-CM   1. Viral URI  J06.9     URI symptoms x4 days, significant sinus pressure.  Will do trial of prednisone 40 mg daily for 5 days, adding in Flonase.  Covid test negative.  Recommended continued symptomatic and supportive care.  Advised to follow-up next week if still not having any improvement for possible antibiotic addition.  No acute distress at this time, no respiratory distress.   Follow Up Instructions:     I discussed the  assessment and treatment plan with the patient. The patient was provided an opportunity to ask questions and all were answered. The patient agreed with the plan and demonstrated an understanding of the instructions.   The patient was advised to call back or seek an in-person evaluation if the symptoms worsen or if the condition fails to improve as anticipated.      Janith Lima, PA-C  06/26/2019 6:13 PM         Janith Lima, PA-C 06/26/19 1814

## 2019-08-14 ENCOUNTER — Ambulatory Visit: Payer: 59 | Admitting: Internal Medicine

## 2019-09-01 ENCOUNTER — Encounter: Payer: Self-pay | Admitting: *Deleted

## 2019-09-01 ENCOUNTER — Telehealth: Payer: Self-pay | Admitting: Neurology

## 2019-09-01 NOTE — Telephone Encounter (Signed)
I sent pt a message in mychart to see how she is doing on [employer] so we can do the authorization.

## 2019-09-01 NOTE — Telephone Encounter (Signed)
Caitlyn @ [employer] is asking for a call from RN re: the PA for pt's Aimovig.  Caitlyn provided the Ref Key and toll free # when calling back Ref key#[id number]

## 2019-09-10 ENCOUNTER — Other Ambulatory Visit: Payer: Self-pay

## 2019-09-12 ENCOUNTER — Encounter: Payer: Self-pay | Admitting: Internal Medicine

## 2019-09-12 ENCOUNTER — Other Ambulatory Visit: Payer: Self-pay

## 2019-09-12 ENCOUNTER — Ambulatory Visit (INDEPENDENT_AMBULATORY_CARE_PROVIDER_SITE_OTHER): Payer: BC Managed Care – PPO | Admitting: Internal Medicine

## 2019-09-12 VITALS — BP 110/68 | HR 76 | Temp 97.5°F | Ht 65.0 in | Wt 176.2 lb

## 2019-09-12 DIAGNOSIS — M25561 Pain in right knee: Secondary | ICD-10-CM | POA: Diagnosis not present

## 2019-09-12 DIAGNOSIS — Z1329 Encounter for screening for other suspected endocrine disorder: Secondary | ICD-10-CM

## 2019-09-12 DIAGNOSIS — G8929 Other chronic pain: Secondary | ICD-10-CM

## 2019-09-12 DIAGNOSIS — Z13220 Encounter for screening for lipoid disorders: Secondary | ICD-10-CM

## 2019-09-12 DIAGNOSIS — Z1389 Encounter for screening for other disorder: Secondary | ICD-10-CM

## 2019-09-12 DIAGNOSIS — J309 Allergic rhinitis, unspecified: Secondary | ICD-10-CM | POA: Diagnosis not present

## 2019-09-12 DIAGNOSIS — Z0184 Encounter for antibody response examination: Secondary | ICD-10-CM

## 2019-09-12 DIAGNOSIS — L509 Urticaria, unspecified: Secondary | ICD-10-CM

## 2019-09-12 DIAGNOSIS — G43909 Migraine, unspecified, not intractable, without status migrainosus: Secondary | ICD-10-CM | POA: Diagnosis not present

## 2019-09-12 DIAGNOSIS — Z1159 Encounter for screening for other viral diseases: Secondary | ICD-10-CM

## 2019-09-12 DIAGNOSIS — Z8639 Personal history of other endocrine, nutritional and metabolic disease: Secondary | ICD-10-CM

## 2019-09-12 DIAGNOSIS — E559 Vitamin D deficiency, unspecified: Secondary | ICD-10-CM

## 2019-09-12 DIAGNOSIS — Z0000 Encounter for general adult medical examination without abnormal findings: Secondary | ICD-10-CM

## 2019-09-12 MED ORDER — HYDROXYZINE HCL 25 MG PO TABS: 25.0000 mg | ORAL_TABLET | Freq: Two times a day (BID) | ORAL | 2 refills | Status: DC | PRN

## 2019-09-12 MED ORDER — CETIRIZINE HCL 10 MG PO TABS: 10.0000 mg | ORAL_TABLET | Freq: Every day | ORAL | 3 refills | Status: DC

## 2019-09-12 MED ORDER — HYDROCORTISONE 2.5 % EX CREA: TOPICAL_CREAM | Freq: Two times a day (BID) | CUTANEOUS | 2 refills | Status: DC

## 2019-09-12 NOTE — Patient Instructions (Addendum)
Sarna lotion emgality or nurtec for h/a call Dr. Jaynee Eagles   Magnesium 250 to 400 mg daily h/a prevention   Start taking prenatal vitamin and disc preconception counseling with ob/gyn    Preparing for Pregnancy If you are considering becoming pregnant, make an appointment to see your regular health care provider to learn how to prepare for a safe and healthy pregnancy (preconception care). During a preconception care visit, your health care provider will:  Do a complete physical exam, including a Pap test.  Take a complete medical history.  Give you information, answer your questions, and help you resolve problems. Preconception checklist Medical history  Tell your health care provider about any current or past medical conditions. Your pregnancy or your ability to become pregnant may be affected by chronic conditions, such as diabetes, chronic hypertension, and thyroid problems.  Include your family's medical history as well as your partner's medical history.  Tell your health care provider about any history of STIs (sexually transmitted infections).These can affect your pregnancy. In some cases, they can be passed to your baby. Discuss any concerns that you have about STIs.  If indicated, discuss the benefits of genetic testing. This testing will show whether there are any genetic conditions that may be passed from you or your partner to your baby.  Tell your health care provider about: ? Any problems you have had with conception or pregnancy. ? Any medicines you take. These include vitamins, herbal supplements, and over-the-counter medicines. ? Your history of immunizations. Discuss any vaccinations that you may need. Diet  Ask your health care provider what to include in a healthy diet that has a balance of nutrients. This is especially important when you are pregnant or preparing to become pregnant.  Ask your health care provider to help you reach a healthy weight before  pregnancy. ? If you are overweight, you may be at higher risk for certain complications, such as high blood pressure, diabetes, and preterm birth. ? If you are underweight, you are more likely to have a baby who has a low birth weight. Lifestyle, work, and home  Let your health care provider know: ? About any lifestyle habits that you have, such as alcohol use, drug use, or smoking. ? About recreational activities that may put you at risk during pregnancy, such as downhill skiing and certain exercise programs. ? Tell your health care provider about any international travel, especially any travel to places with an active [place] virus outbreak. ? About harmful substances that you may be exposed to at work or at home. These include chemicals, pesticides, radiation, or even litter boxes. ? If you do not feel safe at home. Mental health  Tell your health care provider about: ? Any history of mental health conditions, including feelings of depression, sadness, or anxiety. ? Any medicines that you take for a mental health condition. These include herbs and supplements. Home instructions to prepare for pregnancy Lifestyle   Eat a balanced diet. This includes fresh fruits and vegetables, whole grains, lean meats, low-fat dairy products, healthy fats, and foods that are high in fiber. Ask to meet with a nutritionist or registered dietitian for assistance with meal planning and goals.  Get regular exercise. Try to be active for at least 30 minutes a day on most days of the week. Ask your health care provider which activities are safe during pregnancy.  Do not use any products that contain nicotine or tobacco, such as cigarettes and e-cigarettes. If you need help  quitting, ask your health care provider.  Do not drink alcohol.  Do not take illegal drugs.  Maintain a healthy weight. Ask your health care provider what weight range is right for you. General instructions  Keep an accurate record of  your menstrual periods. This makes it easier for your health care provider to determine your baby's due date.  Begin taking prenatal vitamins and folic acid supplements daily as directed by your health care provider.  Manage any chronic conditions, such as high blood pressure and diabetes, as told by your health care provider. This is important. How do I know that I am pregnant? You may be pregnant if you have been sexually active and you miss your period. Symptoms of early pregnancy include:  Mild cramping.  Very light vaginal bleeding (spotting).  Feeling unusually tired.  Nausea and vomiting (morning sickness). If you have any of these symptoms and you suspect that you might be pregnant, you can take a home pregnancy test. These tests check for a hormone in your urine (human chorionic gonadotropin, or hCG). A woman's body begins to make this hormone during early pregnancy. These tests are very accurate. Wait until at least the first day after you miss your period to take one. If the test shows that you are pregnant (you get a positive result), call your health care provider to make an appointment for prenatal care. What should I do if I become pregnant?      Make an appointment with your health care provider as soon as you suspect you are pregnant.  Do not use any products that contain nicotine, such as cigarettes, chewing tobacco, and e-cigarettes. If you need help quitting, ask your health care provider.  Do not drink alcoholic beverages. Alcohol is related to a number of birth defects.  Avoid toxic odors and chemicals.  You may continue to have sexual intercourse if it does not cause pain or other problems, such as vaginal bleeding. This information is not intended to replace advice given to you by your health care provider. Make sure you discuss any questions you have with your health care provider. Document Revised: 05/17/2017 Document Reviewed: 12/05/2015 Elsevier Patient  Education  [place] (urticaria) are itchy, red, swollen areas on the skin. Hives can appear on any part of the body. Hives often fade within 24 hours (acute hives). Sometimes, new hives appear after old ones fade and the cycle can continue for several days or weeks (chronic hives). Hives do not spread from person to person (are not contagious). Hives come from the body's reaction to something a person is allergic to (allergen), something that causes irritation, or various other triggers. When a person is exposed to a trigger, his or her body releases a chemical (histamine) that causes redness, itching, and swelling. Hives can appear right after exposure to a trigger or hours later. What are the causes? This condition may be caused by:  Allergies to foods or ingredients.  Insect bites or stings.  Exposure to pollen or pets.  Contact with latex or chemicals.  Spending time in sunlight, heat, or cold (exposure).  Exercise.  Stress.  Certain medicines. You can also get hives from other medical conditions and treatments, such as:  Viruses, including the common cold.  Bacterial infections, such as urinary tract infections and strep throat.  Certain medicines.  Allergy shots.  Blood transfusions. Sometimes, the cause of this condition is not known (idiopathic hives). What increases the risk? You are  more likely to develop this condition if you:  Are a woman.  Have food allergies, especially to citrus fruits, milk, eggs, peanuts, tree nuts, or shellfish.  Are allergic to: ? Medicines. ? Latex. ? Insects. ? Animals. ? Pollen. What are the signs or symptoms? Common symptoms of this condition include raised, itchy, red or white bumps or patches on your skin. These areas may:  Become large and swollen (welts).  Change in shape and location, quickly and repeatedly.  Be separate hives or connect over a large area of skin.  Sting or become painful.   Turn white when pressed in the center (blanch). In severe cases, yourhands, feet, and face may also become swollen. This may occur if hives develop deeper in your skin. How is this diagnosed? This condition may be diagnosed by your symptoms, medical history, and physical exam.  Your skin, urine, or blood may be tested to find out what is causing your hives and to rule out other health issues.  Your health care provider may also remove a small sample of skin from the affected area and examine it under a microscope (biopsy). How is this treated? Treatment for this condition depends on the cause and severity of your symptoms. Your health care provider may recommend using cool, wet cloths (cool compresses) or taking cool showers to relieve itching. Treatment may include:  Medicines that help: ? Relieve itching (antihistamines). ? Reduce swelling (corticosteroids). ? Treat infection (antibiotics).  An injectable medicine (omalizumab). Your health care provider may prescribe this if you have chronic idiopathic hives and you continue to have symptoms even after treatment with antihistamines. Severe cases may require an emergency injection of adrenaline (epinephrine) to prevent a life-threatening allergic reaction (anaphylaxis). Follow these instructions at home: Medicines  Take and apply over-the-counter and prescription medicines only as told by your health care provider.  If you were prescribed an antibiotic medicine, take it as told by your health care provider. Do not stop using the antibiotic even if you start to feel better. Skin care  Apply cool compresses to the affected areas.  Do not scratch or rub your skin. General instructions  Do not take hot showers or baths. This can make itching worse.  Do not wear tight-fitting clothing.  Use sunscreen and wear protective clothing when you are outside.  Avoid any substances that cause your hives. Keep a journal to help track what  causes your hives. Write down: ? What medicines you take. ? What you eat and drink. ? What products you use on your skin.  Keep all follow-up visits as told by your health care provider. This is important. Contact a health care provider if:  Your symptoms are not controlled with medicine.  Your joints are painful or swollen. Get help right away if:  You have a fever.  You have pain in your abdomen.  Your tongue or lips are swollen.  Your eyelids are swollen.  Your chest or throat feels tight.  You have trouble breathing or swallowing. These symptoms may represent a serious problem that is an emergency. Do not wait to see if the symptoms will go away. Get medical help right away. Call your local emergency services (911 in the [place] Do not drive yourself to the hospital. Summary  Hives (urticaria) are itchy, red, swollen areas on your skin. Hives come from the body's reaction to something a person is allergic to (allergen), something that causes irritation, or various other triggers.  Treatment for this condition depends  on the cause and severity of your symptoms.  Avoid any substances that cause your hives. Keep a journal to help track what causes your hives.  Take and apply over-the-counter and prescription medicines only as told by your health care provider.  Keep all follow-up visits as told by your health care provider. This is important. This information is not intended to replace advice given to you by your health care provider. Make sure you discuss any questions you have with your health care provider. Document Revised: 11/28/2017 Document Reviewed: 11/28/2017 Elsevier Patient Education  2020 [place] injection What is this medicine? GALCANEZUMAB (gal ka NEZ ue mab) is used to prevent migraines and treat cluster headaches. This medicine may be used for other purposes; ask your health care provider or pharmacist if you have questions. COMMON  BRAND NAME(S): Emgality What should I tell my health care provider before I take this medicine? They need to know if you have any of these conditions:  an unusual or allergic reaction to galcanezumab, other medicines, foods, dyes, or preservatives  pregnant or trying to get pregnant  breast-feeding How should I use this medicine? This medicine is for injection under the skin. You will be taught how to prepare and give this medicine. Use exactly as directed. Take your medicine at regular intervals. Do not take your medicine more often than directed. It is important that you put your used needles and syringes in a special sharps container. Do not put them in a trash can. If you do not have a sharps container, call your pharmacist or healthcare provider to get one. Talk to your pediatrician regarding the use of this medicine in children. Special care may be needed. Overdosage: If you think you have taken too much of this medicine contact a poison control center or emergency room at once. NOTE: This medicine is only for you. Do not share this medicine with others. What if I miss a dose? If you miss a dose, take it as soon as you can. If it is almost time for your next dose, take only that dose. Do not take double or extra doses. What may interact with this medicine? Interactions are not expected. This list may not describe all possible interactions. Give your health care provider a list of all the medicines, herbs, non-prescription drugs, or dietary supplements you use. Also tell them if you smoke, drink alcohol, or use illegal drugs. Some items may interact with your medicine. What should I watch for while using this medicine? Tell your doctor or healthcare professional if your symptoms do not start to get better or if they get worse. What side effects may I notice from receiving this medicine? Side effects that you should report to your doctor or health care professional as soon as possible:   allergic reactions like skin rash, itching or hives, swelling of the face, lips, or tongue Side effects that usually do not require medical attention (report these to your doctor or health care professional if they continue or are bothersome):  pain, redness, or irritation at site where injected This list may not describe all possible side effects. Call your doctor for medical advice about side effects. You may report side effects to FDA at 1-800-FDA-1088. Where should I keep my medicine? Keep out of the reach of children. You will be instructed on how to store this medicine. Throw away any unused medicine after the expiration date on the label. NOTE: This sheet is a summary. It may  not cover all possible information. If you have questions about this medicine, talk to your doctor, pharmacist, or health care provider.  2020 Elsevier/Gold Standard (2017-10-31 12:03:23)  Rimegepant oral dissolving tablet What is this medicine? RIMEGEPANT (ri ME je pant) is used to treat migraine headaches with or without aura. An aura is a strange feeling or visual disturbance that warns you of an attack. It is not used to prevent migraines. This medicine may be used for other purposes; ask your health care provider or pharmacist if you have questions. COMMON BRAND NAME(S): NURTEC ODT What should I tell my health care provider before I take this medicine? They need to know if you have any of these conditions:  kidney disease  liver disease  an unusual or allergic reaction to rimegepant, other medicines, foods, dyes, or preservatives  pregnant or trying to get pregnant  breast-feeding How should I use this medicine? Take the medicine by mouth. Follow the directions on the prescription label. Leave the tablet in the sealed blister pack until you are ready to take it. With dry hands, open the blister and gently remove the tablet. If the tablet breaks or crumbles, throw it away and take a new tablet out of the  blister pack. Place the tablet in the mouth and allow it to dissolve, and then swallow. Do not cut, crush, or chew this medicine. You do not need water to take this medicine. Talk to your pediatrician about the use of this medicine in children. Special care may be needed. Overdosage: If you think you have taken too much of this medicine contact a poison control center or emergency room at once. NOTE: This medicine is only for you. Do not share this medicine with others. What if I miss a dose? This does not apply. This medicine is not for regular use. What may interact with this medicine? This medicine may interact with the following medications:  certain medicines for fungal infections like fluconazole, itraconazole  rifampin This list may not describe all possible interactions. Give your health care provider a list of all the medicines, herbs, non-prescription drugs, or dietary supplements you use. Also tell them if you smoke, drink alcohol, or use illegal drugs. Some items may interact with your medicine. What should I watch for while using this medicine? Visit your health care professional for regular checks on your progress. Tell your health care professional if your symptoms do not start to get better or if they get worse. What side effects may I notice from receiving this medicine? Side effects that you should report to your doctor or health care professional as soon as possible:  allergic reactions like skin rash, itching or hives; swelling of the face, lips, or tongue Side effects that usually do not require medical attention (report these to your doctor or health care professional if they continue or are bothersome):  nausea This list may not describe all possible side effects. Call your doctor for medical advice about side effects. You may report side effects to FDA at 1-800-FDA-1088. Where should I keep my medicine? Keep out of the reach of children. Store at room temperature  between 15 and 30 degrees C (59 and 86 degrees F). Throw away any unused medicine after the expiration date. NOTE: This sheet is a summary. It may not cover all possible information. If you have questions about this medicine, talk to your doctor, pharmacist, or health care provider.  2020 Elsevier/Gold Standard (2018-07-29 00:21:31)

## 2019-09-12 NOTE — Progress Notes (Signed)
Chief Complaint  Patient presents with  . Annual Exam  . Urticaria    thinks she is having stress hives on both arms. Came on around midterms in school and has not gone away. Pt's mother has stress hives. Pt is working, in school, and planning a wedding. Stress levels are up  . Knee Problem    right knee discomfort. [place] a long time ago but never got her knee looked at. Feels stiff as though she needs to pop it but it will not pop.    F/u  1. Red bumps c/w hives to b/l arms no new det/lotions/soaps area is not itchy nothing tried. 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 Right knee pain mid knee chronic due to fall 4 years ago on brick steps with heavy bookbag tries knee brance which helps and prn advil pain worse after standing on feet all day  3. Migraines did not tolerate topiramate and aimovig did not help also imitrex and maxalt and effexor not effective and insurance would  Not cover emgality disc emgality and also nurtec  She has to go to sleep and lie down and this helps but still gets want to hold on seeing neurology for now 4. Thinking about getting pregnant spring 2022  Review of Systems  Constitutional: Positive for weight loss.       Down 26 lbs trying    HENT: Negative for hearing loss.   Eyes: Negative for blurred vision.  Respiratory: Negative for shortness of breath.   Cardiovascular: Negative for chest pain.  Gastrointestinal: Negative for abdominal pain.  Musculoskeletal: Positive for joint pain.  Skin: Negative for rash.  Neurological: Negative for headaches.  Psychiatric/Behavioral: Negative for depression.   Past Medical History:  Diagnosis Date  . Allergy   . Chicken pox   . Complication of anesthesia    WITH HER WISDOM TEETH SURGERY SHE HAD TO GO TO THE OR TO HAVE THIS DONE DUE TO TACHYCARDIA DUE TO GRAVES DISEASE  . Complication of anesthesia   . Dysmenorrhea   . Family history of endometriosis     mother,sister  . Graves disease    graves, hyperthyroidism   . Headache    MIGRAINES  . Migraine   . Ovarian cyst   . Panic attack   . UTI (urinary tract infection)    Past Surgical History:  Procedure Laterality Date  . APPENDECTOMY  2005   WITH DRAIN PLACED DUE TO ABSCESS 2005 [facility]  . BREAST BIOPSY Right 2017   had a reaction to xylo or xylo with epi where her throat closed up  . CHROMOPERTUBATION Right 08/12/2018   Procedure: CHROMOPERTUBATION;  Surgeon: Benjaman Kindler, MD;  Location: [facility] ORS;  Service: Gynecology;  Laterality: Right;  . FOOT SURGERY Bilateral 2008   toes going outward surgery to turn toes inward 2010   . LAPAROSCOPIC OVARIAN CYSTECTOMY Right 08/12/2018   Procedure: LAPAROSCOPIC OVARIAN CYSTECTOMY;  Surgeon: Benjaman Kindler, MD;  Location: [facility] ORS;  Service: Gynecology;  Laterality: Right;  . LYSIS OF ADHESION Right 08/12/2018   Procedure: LYSIS OF ADHESIONS Lysis of Omental Adhesions, right cecum adhesions, right fallopian tube and right adnexal adhesions;  Surgeon: Benjaman Kindler, MD;  Location: [facility] ORS;  Service: Gynecology;  Laterality: Right;  . TONSILLECTOMY AND ADENOIDECTOMY N/A 04/17/2017   Procedure: TONSILLECTOMY AND POSSIBLE  ADENOIDECTOMY;  Surgeon: Beverly Gust, MD;  Location: [facility] ORS;  Service: ENT;  Laterality: N/A;  .  WISDOM TOOTH EXTRACTION     2016   Family History  Problem Relation Age of Onset  . Graves' disease Other   . Diabetes Father   . Hyperlipidemia Father   . Graves' disease Maternal Grandfather   . Miscarriages / Stillbirths Maternal Grandfather   . Cancer Maternal Grandfather        prostate/bladder   . Ovarian cancer Paternal Grandmother   . Cancer Paternal Grandmother        brain  . Hypertension Mother   . Migraines Mother   . Learning disabilities Sister   . Cancer Maternal Grandmother        lung (small cell) >liver>pancreas smoker   . Migraines Maternal Grandmother   . Cancer Paternal Grandfather         skin cancer   . Breast cancer Neg Hx   . Colon cancer Neg Hx   . Heart disease Neg Hx    Social History   Socioeconomic History  . Marital status: Single    Spouse name: Not on file  . Number of children: 0  . Years of education: Not on file  . Highest education level: Bachelor's degree (e.g., BA, AB, BS)  Occupational History  . Not on file  Tobacco Use  . Smoking status: Never Smoker  . Smokeless tobacco: Never Used  Substance and Sexual Activity  . Alcohol use: No    Alcohol/week: 0.0 standard drinks  . Drug use: No  . Sexual activity: Never    Birth control/protection: None  Other Topics Concern  . Not on file  Social History Narrative   BA Degree [employer]   Wears seat belt,    Guns at home    Safe in relationship    Does not exercise    Caffeine: cup of tea daily   Getting married 04/2020 new last name Arave   Social Determinants of Health   Financial Resource Strain:   . Difficulty of Paying Living Expenses:   Food Insecurity:   . Worried About Charity fundraiser in the Last Year:   . Arboriculturist in the Last Year:   Transportation Needs:   . Film/video editor (Medical):   Marland Kitchen Lack of Transportation (Non-Medical):   Physical Activity:   . Days of Exercise per Week:   . Minutes of Exercise per Session:   Stress:   . Feeling of Stress :   Social Connections:   . Frequency of Communication with Friends and Family:   . Frequency of Social Gatherings with Friends and Family:   . Attends Religious Services:   . Active Member of Clubs or Organizations:   . Attends Archivist Meetings:   Marland Kitchen Marital Status:   Intimate Partner Violence:   . Fear of Current or Ex-Partner:   . Emotionally Abused:   Marland Kitchen Physically Abused:   . Sexually Abused:    Current Meds  Medication Sig  . cetirizine (ZYRTEC) 10 MG tablet Take 1 tablet (10 mg total) by mouth at bedtime.  . montelukast (SINGULAIR) 10 MG tablet Take 1 tablet (10 mg total) by mouth at bedtime.  .  norgestimate-ethinyl estradiol (ORTHO-CYCLEN,SPRINTEC,PREVIFEM) 0.25-35 MG-MCG tablet Take 1 tablet by mouth daily.  . [DISCONTINUED] cetirizine (ZYRTEC) 10 MG tablet Take 1 tablet (10 mg total) by mouth at bedtime.   Allergies  Allergen Reactions  . Lidocaine Anaphylaxis  . Lidocaine-Epinephrine Anaphylaxis  . Toradol [Ketorolac Tromethamine] Anaphylaxis and Rash    Throat swelling and feeling  of heat over whole body.  . Tramadol Anaphylaxis  . Latex Rash  . Linzess [Linaclotide]     Ab cramps   . Tape Rash   No results found for this or any previous visit (from the past 2160 hour(s)). Objective  Body mass index is 29.32 kg/m. Wt Readings from Last 3 Encounters:  09/12/19 176 lb 3.2 oz (79.9 kg)  03/20/19 190 lb (86.2 kg)  02/16/19 190 lb (86.2 kg)   Temp Readings from Last 3 Encounters:  09/12/19 (!) 97.5 F (36.4 C) (Temporal)  02/17/19 98.3 F (36.8 C)  02/04/19 97.8 F (36.6 C)   BP Readings from Last 3 Encounters:  09/12/19 110/68  02/16/19 122/67  02/04/19 (!) 120/58   Pulse Readings from Last 3 Encounters:  09/12/19 76  02/16/19 87  02/04/19 68    Physical Exam Vitals and nursing note reviewed.  Constitutional:      Appearance: Normal appearance. She is well-developed and well-groomed.  HENT:     Head: Normocephalic and atraumatic.  Eyes:     Conjunctiva/sclera: Conjunctivae normal.     Pupils: Pupils are equal, round, and reactive to light.  Cardiovascular:     Rate and Rhythm: Normal rate and regular rhythm.     Heart sounds: Normal heart sounds. No murmur.  Pulmonary:     Effort: Pulmonary effort is normal.     Breath sounds: Normal breath sounds.  Abdominal:     General: Abdomen is flat. Bowel sounds are normal.     Tenderness: There is no abdominal tenderness.  Skin:    General: Skin is warm and dry.  Neurological:     General: No focal deficit present.     Mental Status: She is alert and oriented to person, place, and time. Mental status  is at baseline.     Gait: Gait normal.  Psychiatric:        Attention and Perception: Attention and perception normal.        Mood and Affect: Mood and affect normal.        Speech: Speech normal.        Behavior: Behavior normal. Behavior is cooperative.        Thought Content: Thought content normal.        Cognition and Memory: Cognition and memory normal.        Judgment: Judgment normal.     Assessment  Plan  Hives - Plan: hydrocortisone 2.5 % cream, hydrOXYzine (ATARAX/VISTARIL) 25 MG tablet  Allergic rhinitis, unspecified seasonality, unspecified trigger - Plan: cetirizine (ZYRTEC) 10 MG tablet  Chronic pain of right knee - Plan: Ambulatory referral to Orthopedic Surgery Wants pm appt   Migraine without status migrainosus, not intractable, unspecified migraine type F/u neurology disc emgality or nurtec  id not tolerate topiramate and aimovig did not help also imitrex and maxalt and effexor not effective and insurance would  Not cover emgality disc emgality and also nurtec  She has to go to sleep and lie down and this helps but still gets want to hold on seeing neurology for now  HM  Never had flu shot  ? Date HPV Tdap utd  Hold covid 19 vaccine per pt per ob/gyn  3/3hep B vaccines check titer  STD check 03/2017 [facility] neg f/u with Dr. Leafy Ro  -pap 04/22/18 negative no HPV comment [facility] appt sch 03/2020   Dentist Joe Harns Dr. Gabriel Carina notes reviewed 11/05/17 for Graves dx'ed 2005 tx'ed methimazole until 12/2013 TSH and T4  remained normal labs 2016, 2017, 2019 labs. Graves in remission though labs checked f/u based on labs  Provider: Dr. [name] McLean-Scocuzza-[facility]

## 2019-10-02 ENCOUNTER — Encounter: Payer: Self-pay | Admitting: Internal Medicine

## 2019-10-17 ENCOUNTER — Telehealth: Payer: Self-pay | Admitting: Internal Medicine

## 2019-10-17 NOTE — Telephone Encounter (Signed)
Rejection Reason - Patient did not respond" [facility] said about 4 hours ago  "Patient needs to speak with our financial advisor before scheduling an appointment, 09/15/19 at 3:38 PM." [facility] said about 1 month ago

## 2019-11-03 ENCOUNTER — Encounter: Payer: Self-pay | Admitting: Internal Medicine

## 2019-11-13 NOTE — Addendum Note (Signed)
Addended by: Orland Mustard on: 11/13/2019 06:25 PM   Modules accepted: Orders

## 2019-12-19 ENCOUNTER — Encounter: Payer: Self-pay | Admitting: Internal Medicine

## 2019-12-19 DIAGNOSIS — M222X1 Patellofemoral disorders, right knee: Secondary | ICD-10-CM | POA: Insufficient documentation

## 2019-12-30 ENCOUNTER — Encounter: Payer: Self-pay | Admitting: Internal Medicine

## 2020-01-08 ENCOUNTER — Encounter: Payer: Self-pay | Admitting: Internal Medicine

## 2020-02-12 ENCOUNTER — Other Ambulatory Visit: Payer: BC Managed Care – PPO

## 2020-02-19 ENCOUNTER — Other Ambulatory Visit: Payer: Self-pay

## 2020-02-19 ENCOUNTER — Other Ambulatory Visit (INDEPENDENT_AMBULATORY_CARE_PROVIDER_SITE_OTHER): Payer: BC Managed Care – PPO

## 2020-02-19 DIAGNOSIS — Z0000 Encounter for general adult medical examination without abnormal findings: Secondary | ICD-10-CM | POA: Diagnosis not present

## 2020-02-19 DIAGNOSIS — Z8639 Personal history of other endocrine, nutritional and metabolic disease: Secondary | ICD-10-CM

## 2020-02-19 DIAGNOSIS — Z1389 Encounter for screening for other disorder: Secondary | ICD-10-CM

## 2020-02-19 DIAGNOSIS — Z1329 Encounter for screening for other suspected endocrine disorder: Secondary | ICD-10-CM | POA: Diagnosis not present

## 2020-02-19 DIAGNOSIS — Z1159 Encounter for screening for other viral diseases: Secondary | ICD-10-CM | POA: Diagnosis not present

## 2020-02-19 DIAGNOSIS — E559 Vitamin D deficiency, unspecified: Secondary | ICD-10-CM | POA: Diagnosis not present

## 2020-02-19 DIAGNOSIS — Z13220 Encounter for screening for lipoid disorders: Secondary | ICD-10-CM

## 2020-02-19 DIAGNOSIS — Z0184 Encounter for antibody response examination: Secondary | ICD-10-CM

## 2020-02-19 LAB — CBC WITH DIFFERENTIAL/PLATELET
Basophils Absolute: 0 10*3/uL (ref 0.0–0.1)
Basophils Relative: 0.2 % (ref 0.0–3.0)
Eosinophils Absolute: 0.1 10*3/uL (ref 0.0–0.7)
Eosinophils Relative: 1.1 % (ref 0.0–5.0)
HCT: 40 % (ref 36.0–46.0)
Hemoglobin: 13.2 g/dL (ref 12.0–15.0)
Lymphocytes Relative: 38.7 % (ref 12.0–46.0)
Lymphs Abs: 2.3 10*3/uL (ref 0.7–4.0)
MCHC: 33 g/dL (ref 30.0–36.0)
MCV: 89.9 fl (ref 78.0–100.0)
Monocytes Absolute: 0.5 10*3/uL (ref 0.1–1.0)
Monocytes Relative: 8.3 % (ref 3.0–12.0)
Neutro Abs: 3.1 10*3/uL (ref 1.4–7.7)
Neutrophils Relative %: 51.7 % (ref 43.0–77.0)
Platelets: 135 10*3/uL — ABNORMAL LOW (ref 150.0–400.0)
RBC: 4.45 Mil/uL (ref 3.87–5.11)
RDW: 13.7 % (ref 11.5–15.5)
WBC: 5.9 10*3/uL (ref 4.0–10.5)

## 2020-02-19 LAB — COMPREHENSIVE METABOLIC PANEL
ALT: 11 U/L (ref 0–35)
AST: 19 U/L (ref 0–37)
Albumin: 4 g/dL (ref 3.5–5.2)
Alkaline Phosphatase: 53 U/L (ref 39–117)
BUN: 10 mg/dL (ref 6–23)
CO2: 28 mEq/L (ref 19–32)
Calcium: 9.3 mg/dL (ref 8.4–10.5)
Chloride: 108 mEq/L (ref 96–112)
Creatinine, Ser: 0.64 mg/dL (ref 0.40–1.20)
GFR: 111.41 mL/min (ref >60.00)
Glucose, Bld: 81 mg/dL (ref 70–99)
Potassium: 4.4 mEq/L (ref 3.5–5.1)
Sodium: 144 mEq/L (ref 135–145)
Total Bilirubin: 0.7 mg/dL (ref 0.2–1.2)
Total Protein: 6.4 g/dL (ref 6.0–8.3)

## 2020-02-19 LAB — VITAMIN D 25 HYDROXY (VIT D DEFICIENCY, FRACTURES): VITD: 39.55 ng/mL (ref 30.00–100.00)

## 2020-02-19 LAB — LIPID PANEL
Cholesterol: 173 mg/dL (ref 0–200)
HDL: 61.8 mg/dL (ref >39.00)
LDL Cholesterol: 95 mg/dL (ref 0–99)
NonHDL: 111.2
Total CHOL/HDL Ratio: 3
Triglycerides: 82 mg/dL (ref 0.0–149.0)
VLDL: 16.4 mg/dL (ref 0.0–40.0)

## 2020-02-19 LAB — T4, FREE: Free T4: 0.74 ng/dL (ref 0.60–1.60)

## 2020-02-19 LAB — TSH: TSH: 2.99 u[IU]/mL (ref 0.35–4.50)

## 2020-02-20 ENCOUNTER — Other Ambulatory Visit: Payer: Self-pay

## 2020-02-20 DIAGNOSIS — R7989 Other specified abnormal findings of blood chemistry: Secondary | ICD-10-CM

## 2020-02-20 LAB — HEPATITIS B SURFACE ANTIBODY, QUANTITATIVE: Hep B S AB Quant (Post): 51 m[IU]/mL (ref >=10)

## 2020-02-20 LAB — URINALYSIS, ROUTINE W REFLEX MICROSCOPIC
Bacteria, UA: NONE SEEN /HPF
Bilirubin Urine: NEGATIVE
Glucose, UA: NEGATIVE
Hgb urine dipstick: NEGATIVE
Hyaline Cast: NONE SEEN /LPF
Ketones, ur: NEGATIVE
Nitrite: NEGATIVE
Protein, ur: NEGATIVE
RBC / HPF: NONE SEEN /HPF (ref 0–2)
Specific Gravity, Urine: 1.016 (ref 1.001–1.03)
pH: 5.5 (ref 5.0–8.0)

## 2020-03-01 ENCOUNTER — Ambulatory Visit (INDEPENDENT_AMBULATORY_CARE_PROVIDER_SITE_OTHER): Payer: BC Managed Care – PPO | Admitting: Family Medicine

## 2020-03-01 ENCOUNTER — Other Ambulatory Visit: Payer: Self-pay

## 2020-03-01 ENCOUNTER — Encounter: Payer: Self-pay | Admitting: Family Medicine

## 2020-03-01 VITALS — BP 97/59 | HR 61 | Ht 65.0 in | Wt 163.0 lb

## 2020-03-01 DIAGNOSIS — G43009 Migraine without aura, not intractable, without status migrainosus: Secondary | ICD-10-CM

## 2020-03-01 MED ORDER — SUMATRIPTAN SUCCINATE 100 MG PO TABS: 100.0000 mg | ORAL_TABLET | ORAL | 0 refills | Status: DC | PRN

## 2020-03-01 MED ORDER — AJOVY 225 MG/1.5ML ~~LOC~~ SOAJ: 225.0000 mg | SUBCUTANEOUS | 3 refills | Status: DC

## 2020-03-01 NOTE — Patient Instructions (Signed)
We will start Ajovy every 30 days. We will restart sumatriptan as needed. Try to avoid any abortive medication more than 1-2 times weekly.   Stay well hydrated. Well balanced diet and regular exercise advised.   Follow up in 6 months   Fremanezumab injection What is this medicine? FREMANEZUMAB (fre ma NEZ ue mab) is used to prevent migraine headaches. This medicine may be used for other purposes; ask your health care provider or pharmacist if you have questions. COMMON BRAND NAME(S): AJOVY What should I tell my health care provider before I take this medicine? They need to know if you have any of these conditions:  an unusual or allergic reaction to fremanezumab, other medicines, foods, dyes, or preservatives  pregnant or trying to get pregnant  breast-feeding How should I use this medicine? This medicine is for injection under the skin. You will be taught how to prepare and give this medicine. Use exactly as directed. Take your medicine at regular intervals. Do not take your medicine more often than directed. It is important that you put your used needles and syringes in a special sharps container. Do not put them in a trash can. If you do not have a sharps container, call your pharmacist or healthcare provider to get one. Talk to your pediatrician regarding the use of this medicine in children. Special care may be needed. Overdosage: If you think you have taken too much of this medicine contact a poison control center or emergency room at once. NOTE: This medicine is only for you. Do not share this medicine with others. What if I miss a dose? If you miss a dose, take it as soon as you can. If it is almost time for your next dose, take only that dose. Do not take double or extra doses. What may interact with this medicine? Interactions are not expected. This list may not describe all possible interactions. Give your health care provider a list of all the medicines, herbs,  non-prescription drugs, or dietary supplements you use. Also tell them if you smoke, drink alcohol, or use illegal drugs. Some items may interact with your medicine. What should I watch for while using this medicine? Tell your doctor or healthcare professional if your symptoms do not start to get better or if they get worse. What side effects may I notice from receiving this medicine? Side effects that you should report to your doctor or health care professional as soon as possible:  allergic reactions like skin rash, itching or hives, swelling of the face, lips, or tongue Side effects that usually do not require medical attention (report these to your doctor or health care professional if they continue or are bothersome):  pain, redness, or irritation at site where injected This list may not describe all possible side effects. Call your doctor for medical advice about side effects. You may report side effects to FDA at 1-800-FDA-1088. Where should I keep my medicine? Keep out of the reach of children. You will be instructed on how to store this medicine. Throw away any unused medicine after the expiration date on the label. NOTE: This sheet is a summary. It may not cover all possible information. If you have questions about this medicine, talk to your doctor, pharmacist, or health care provider.  2020 Elsevier/Gold Standard (2017-02-12 17:22:56)   Analgesic Rebound Headache An analgesic rebound headache, sometimes called a medication overuse headache, is a headache that comes after pain medicine (analgesic) taken to treat the original (primary) headache has  worn off. Any type of primary headache can return as a rebound headache if a person regularly takes analgesics more than three times a week to treat it. The types of primary headaches that are commonly associated with rebound headaches include:  Migraines.  Headaches that arise from tense muscles in the head and neck area (tension  headaches).  Headaches that develop and happen again (recur) on one side of the head and around the eye (cluster headaches). If rebound headaches continue, they become chronic daily headaches. What are the causes? This condition may be caused by frequent use of:  Over-the-counter medicines such as aspirin, ibuprofen, and acetaminophen.  Sinus relief medicines and other medicines that contain caffeine.  Narcotic pain medicines such as codeine and oxycodone. What are the signs or symptoms? The symptoms of a rebound headache are the same as the symptoms of the original headache. Some of the symptoms of specific types of headaches include: Migraine headache  Pulsing or throbbing pain on one or both sides of the head.  Severe pain that interferes with daily activities.  Pain that is worsened by physical activity.  Nausea, vomiting, or both.  Pain with exposure to bright light, loud noises, or strong smells.  General sensitivity to bright light, loud noises, or strong smells.  Visual changes.  Numbness of one or both arms. Tension headache  Pressure around the head.  Dull, aching head pain.  Pain felt over the front and sides of the head.  Tenderness in the muscles of the head, neck, and shoulders. Cluster headache  Severe pain that begins in or around one eye or temple.  Redness and tearing in the eye on the same side as the pain.  Droopy or swollen eyelid.  One-sided head pain.  Nausea.  Runny nose.  Sweaty, pale facial skin.  Restlessness. How is this diagnosed? This condition is diagnosed by:  Reviewing your medical history. This includes the nature of your primary headaches.  Reviewing the types of pain medicines that you have been using to treat your headaches and how often you take them. How is this treated? This condition may be treated or managed by:  Discontinuing frequent use of the analgesic medicine. Doing this may worsen your headaches at  first, but the pain should eventually become more manageable, less frequent, and less severe.  Seeing a headache specialist. He or she may be able to help you manage your headaches and help make sure there is not another cause of the headaches.  Using methods of stress relief, such as acupuncture, counseling, biofeedback, and massage. Talk with your health care provider about which methods might be good for you. Follow these instructions at home:  Take over-the-counter and prescription medicines only as told by your health care provider.  Stop the repeated use of pain medicine as told by your health care provider. Stopping can be difficult. Carefully follow instructions from your health care provider.  Avoid triggers that are known to cause your primary headaches.  Keep all follow-up visits as told by your health care provider. This is important. Contact a health care provider if:  You continue to experience headaches after following treatments that your health care provider recommended. Get help right away if:  You develop new headache pain.  You develop headache pain that is different than what you have experienced in the past.  You develop numbness or tingling in your arms or legs.  You develop changes in your speech or vision. This information is not intended  to replace advice given to you by your health care provider. Make sure you discuss any questions you have with your health care provider. Document Revised: 04/27/2017 Document Reviewed: 10/18/2015 Elsevier Patient Education  [place].   Migraine Headache A migraine headache is a very strong throbbing pain on one side or both sides of your head. This type of headache can also cause other symptoms. It can last from 4 hours to 3 days. Talk with your doctor about what things may bring on (trigger) this condition. What are the causes? The exact cause of this condition is not known. This condition may be triggered or  caused by:  Drinking alcohol.  Smoking.  Taking medicines, such as: ? Medicine used to treat chest pain (nitroglycerin). ? Birth control pills. ? Estrogen. ? Some blood pressure medicines.  Eating or drinking certain products.  Doing physical activity. Other things that may trigger a migraine headache include:  Having a menstrual period.  Pregnancy.  Hunger.  Stress.  Not getting enough sleep or getting too much sleep.  Weather changes.  Tiredness (fatigue). What increases the risk?  Being 64-21 years old.  Being female.  Having a family history of migraine headaches.  Being Caucasian.  Having depression or anxiety.  Being very overweight. What are the signs or symptoms?  A throbbing pain. This pain may: ? Happen in any area of the head, such as on one side or both sides. ? Make it hard to do daily activities. ? Get worse with physical activity. ? Get worse around bright lights or loud noises.  Other symptoms may include: ? Feeling sick to your stomach (nauseous). ? Vomiting. ? Dizziness. ? Being sensitive to bright lights, loud noises, or smells.  Before you get a migraine headache, you may get warning signs (an aura). An aura may include: ? Seeing flashing lights or having blind spots. ? Seeing bright spots, halos, or zigzag lines. ? Having tunnel vision or blurred vision. ? Having numbness or a tingling feeling. ? Having trouble talking. ? Having weak muscles.  Some people have symptoms after a migraine headache (postdromal phase), such as: ? Tiredness. ? Trouble thinking (concentrating). How is this treated?  Taking medicines that: ? Relieve pain. ? Relieve the feeling of being sick to your stomach. ? Prevent migraine headaches.  Treatment may also include: ? Having acupuncture. ? Avoiding foods that bring on migraine headaches. ? Learning ways to control your body functions (biofeedback). ? Therapy to help you know and deal with  negative thoughts (cognitive behavioral therapy). Follow these instructions at home: Medicines  Take over-the-counter and prescription medicines only as told by your doctor.  Ask your doctor if the medicine prescribed to you: ? Requires you to avoid driving or using heavy machinery. ? Can cause trouble pooping (constipation). You may need to take these steps to prevent or treat trouble pooping:  Drink enough fluid to keep your pee (urine) pale yellow.  Take over-the-counter or prescription medicines.  Eat foods that are high in fiber. These include beans, whole grains, and fresh fruits and vegetables.  Limit foods that are high in fat and sugar. These include fried or sweet foods. Lifestyle  Do not drink alcohol.  Do not use any products that contain nicotine or tobacco, such as cigarettes, e-cigarettes, and chewing tobacco. If you need help quitting, ask your doctor.  Get at least 8 hours of sleep every night.  Limit and deal with stress. General instructions  Keep a journal to find out what may bring on your migraine headaches. For example, write down: ? What you eat and drink. ? How much sleep you get. ? Any change in what you eat or drink. ? Any change in your medicines.  If you have a migraine headache: ? Avoid things that make your symptoms worse, such as bright lights. ? It may help to lie down in a dark, quiet room. ? Do not drive or use heavy machinery. ? Ask your doctor what activities are safe for you.  Keep all follow-up visits as told by your doctor. This is important. Contact a doctor if:  You get a migraine headache that is different or worse than others you have had.  You have more than 15 headache days in one month. Get help right away if:  Your migraine headache gets very bad.  Your migraine headache lasts longer than 72 hours.  You have a fever.  You have a stiff neck.  You have trouble seeing.  Your muscles feel weak or like you  cannot control them.  You start to lose your balance a lot.  You start to have trouble walking.  You pass out (faint).  You have a seizure. Summary  A migraine headache is a very strong throbbing pain on one side or both sides of your head. These headaches can also cause other symptoms.  This condition may be treated with medicines and changes to your lifestyle.  Keep a journal to find out what may bring on your migraine headaches.  Contact a doctor if you get a migraine headache that is different or worse than others you have had.  Contact your doctor if you have more than 15 headache days in a month. This information is not intended to replace advice given to you by your health care provider. Make sure you discuss any questions you have with your health care provider. Document Revised: 09/06/2018 Document Reviewed: 06/27/2018 Elsevier Patient Education  [place].
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
## 2020-03-05 ENCOUNTER — Encounter: Payer: Self-pay | Admitting: Family Medicine

## 2020-03-11 ENCOUNTER — Encounter: Payer: Self-pay | Admitting: Family Medicine

## 2020-03-11 ENCOUNTER — Ambulatory Visit: Payer: BC Managed Care – PPO | Admitting: Internal Medicine

## 2020-03-11 ENCOUNTER — Encounter: Payer: Self-pay | Admitting: Internal Medicine

## 2020-03-11 ENCOUNTER — Telehealth: Payer: Self-pay | Admitting: Family Medicine

## 2020-03-11 ENCOUNTER — Other Ambulatory Visit: Payer: Self-pay

## 2020-03-11 VITALS — BP 92/62 | HR 68 | Temp 97.6°F | Ht 65.0 in | Wt 159.0 lb

## 2020-03-11 DIAGNOSIS — J302 Other seasonal allergic rhinitis: Secondary | ICD-10-CM

## 2020-03-11 DIAGNOSIS — G43009 Migraine without aura, not intractable, without status migrainosus: Secondary | ICD-10-CM

## 2020-03-11 DIAGNOSIS — Z0000 Encounter for general adult medical examination without abnormal findings: Secondary | ICD-10-CM | POA: Insufficient documentation

## 2020-03-11 DIAGNOSIS — D696 Thrombocytopenia, unspecified: Secondary | ICD-10-CM

## 2020-03-11 MED ORDER — MONTELUKAST SODIUM 10 MG PO TABS: 10.0000 mg | ORAL_TABLET | Freq: Every day | ORAL | 3 refills | Status: DC

## 2020-03-11 NOTE — Progress Notes (Addendum)
Chief Complaint  Patient presents with  . Follow-up   Annual doing well except  1. Migraines not controlled tried maxalt odt in the past helped also now imitrex helps but Ajovy bade her tongue feel funny and brain fog, hand tremor 1-2 days after dose and reduced sleep so stopped, aimovig did not help In the past. Excedrin helps but no long lasting effects  2. Allergies worse summer and feb will cal back if wants to see allergist  3. Chronic right knee pain saw Dr. Alvan Dame who rec PT which she completed and it helped but was hard to get done with work schedule and school. Pain better and no need for ortho for now  4. Reviewed labs plts low will repeat in the future    Review of Systems  Constitutional: Positive for weight loss.  HENT: Negative for hearing loss.   Eyes: Negative for blurred vision.  Respiratory: Negative for shortness of breath.   Cardiovascular: Negative for chest pain.  Gastrointestinal: Negative for abdominal pain.  Musculoskeletal: Negative for joint pain.  Skin: Negative for rash.  Neurological: Negative for headaches.  Psychiatric/Behavioral: Negative for depression.   Past Medical History:  Diagnosis Date  . Allergy   . Chicken pox   . Complication of anesthesia    WITH HER WISDOM TEETH SURGERY SHE HAD TO GO TO THE OR TO HAVE THIS DONE DUE TO TACHYCARDIA DUE TO GRAVES DISEASE  . Complication of anesthesia   . Dysmenorrhea   . Family history of endometriosis    mother,sister  . Graves disease    graves, hyperthyroidism   . Headache    MIGRAINES  . Migraine   . Ovarian cyst   . Panic attack   . UTI (urinary tract infection)    Past Surgical History:  Procedure Laterality Date  . APPENDECTOMY  2005   WITH DRAIN PLACED DUE TO ABSCESS 2005 [facility]  . BREAST BIOPSY Right 2017   had a reaction to xylo or xylo with epi where her throat closed up  . CHROMOPERTUBATION Right 08/12/2018   Procedure: CHROMOPERTUBATION;  Surgeon: Benjaman Kindler, MD;  Location: [facility]  ORS;  Service: Gynecology;  Laterality: Right;  . FOOT SURGERY Bilateral 2008   toes going outward surgery to turn toes inward 2010   . LAPAROSCOPIC OVARIAN CYSTECTOMY Right 08/12/2018   Procedure: LAPAROSCOPIC OVARIAN CYSTECTOMY;  Surgeon: Benjaman Kindler, MD;  Location: [facility] ORS;  Service: Gynecology;  Laterality: Right;  . LYSIS OF ADHESION Right 08/12/2018   Procedure: LYSIS OF ADHESIONS Lysis of Omental Adhesions, right cecum adhesions, right fallopian tube and right adnexal adhesions;  Surgeon: Benjaman Kindler, MD;  Location: [facility] ORS;  Service: Gynecology;  Laterality: Right;  . TONSILLECTOMY AND ADENOIDECTOMY N/A 04/17/2017   Procedure: TONSILLECTOMY AND POSSIBLE  ADENOIDECTOMY;  Surgeon: Beverly Gust, MD;  Location: [facility] ORS;  Service: ENT;  Laterality: N/A;  . WISDOM TOOTH EXTRACTION     2016   Family History  Problem Relation Age of Onset  . Graves' disease Other   . Diabetes Father   . Hyperlipidemia Father   . Graves' disease Maternal Grandfather   . Miscarriages / Stillbirths Maternal Grandfather   . Cancer Maternal Grandfather        prostate/bladder   . Ovarian cancer Paternal Grandmother   . Cancer Paternal Grandmother        brain  . Hypertension Mother   . Migraines Mother   . Learning disabilities Sister   . Cancer Maternal Grandmother  lung (small cell) >liver>pancreas smoker   . Migraines Maternal Grandmother   . Cancer Paternal Grandfather        skin cancer   . Breast cancer Neg Hx   . Colon cancer Neg Hx   . Heart disease Neg Hx    Social History   Socioeconomic History  . Marital status: Single    Spouse name: Not on file  . Number of children: 0  . Years of education: Not on file  . Highest education level: Bachelor's degree (e.g., BA, AB, BS)  Occupational History  . Not on file  Tobacco Use  . Smoking status: Never Smoker  . Smokeless tobacco: Never Used  Vaping Use  . Vaping Use: Never used  Substance and Sexual Activity  .  Alcohol use: No    Alcohol/week: 0.0 standard drinks  . Drug use: No  . Sexual activity: Never    Birth control/protection: None  Other Topics Concern  . Not on file  Social History Narrative   BA Degree [employer]   Wears seat belt,    Guns at home    Safe in relationship    Does not exercise    Caffeine: cup of tea daily   Getting married 04/2020 new last name Birge   Social Determinants of Health   Financial Resource Strain:   . Difficulty of Paying Living Expenses: Not on file  Food Insecurity:   . Worried About Charity fundraiser in the Last Year: Not on file  . Ran Out of Food in the Last Year: Not on file  Transportation Needs:   . Lack of Transportation (Medical): Not on file  . Lack of Transportation (Non-Medical): Not on file  Physical Activity:   . Days of Exercise per Week: Not on file  . Minutes of Exercise per Session: Not on file  Stress:   . Feeling of Stress : Not on file  Social Connections:   . Frequency of Communication with Friends and Family: Not on file  . Frequency of Social Gatherings with Friends and Family: Not on file  . Attends Religious Services: Not on file  . Active Member of Clubs or Organizations: Not on file  . Attends Archivist Meetings: Not on file  . Marital Status: Not on file  Intimate Partner Violence:   . Fear of Current or Ex-Partner: Not on file  . Emotionally Abused: Not on file  . Physically Abused: Not on file  . Sexually Abused: Not on file   Current Meds  Medication Sig  . cetirizine (ZYRTEC) 10 MG tablet Take 1 tablet (10 mg total) by mouth at bedtime.  . hydrocortisone 2.5 % cream Apply topically 2 (two) times daily. Arms as needed  . hydrOXYzine (ATARAX/VISTARIL) 25 MG tablet Take 1 tablet (25 mg total) by mouth 2 (two) times daily as needed.  . montelukast (SINGULAIR) 10 MG tablet Take 1 tablet (10 mg total) by mouth at bedtime.  . norgestimate-ethinyl estradiol (ORTHO-CYCLEN,SPRINTEC,PREVIFEM) 0.25-35 MG-MCG  tablet Take 1 tablet by mouth daily.  . SUMAtriptan (IMITREX) 100 MG tablet Take 1 tablet (100 mg total) by mouth every 2 (two) hours as needed for migraine. May repeat in 2 hours if headache persists or recurs.  . [DISCONTINUED] montelukast (SINGULAIR) 10 MG tablet Take 1 tablet (10 mg total) by mouth at bedtime.   Allergies  Allergen Reactions  . Lidocaine Anaphylaxis  . Lidocaine-Epinephrine Anaphylaxis  . Toradol [Ketorolac Tromethamine] Anaphylaxis and Rash    Throat  swelling and feeling of heat over whole body.  . Tramadol Anaphylaxis  . Latex Rash  . Linzess [Linaclotide]     Ab cramps   . Tape Rash   Recent Results (from the past 2160 hour(s))  Hepatitis B surface antibody,quantitative     Status: None   Collection Time: 02/19/20  9:01 AM  Result Value Ref Range   Hepatitis B-Post 51 > OR = 10 mIU/mL    Comment: . Patient has immunity to hepatitis B virus. . For additional information, please refer to http://education.quest[facility].com/faq/FAQ105 (This link is being provided for informational/ educational purposes only).   T4, free     Status: None   Collection Time: 02/19/20  9:01 AM  Result Value Ref Range   Free T4 0.74 0.60 - 1.60 ng/dL    Comment: Specimens from patients who are undergoing biotin therapy and /or ingesting biotin supplements may contain high levels of biotin.  The higher biotin concentration in these specimens interferes with this Free T4 assay.  Specimens that contain high levels  of biotin may cause false high results for this Free T4 assay.  Please interpret results in light of the total clinical presentation of the patient.    VITAMIN D 25 Hydroxy (Vit-D Deficiency, Fractures)     Status: None   Collection Time: 02/19/20  9:01 AM  Result Value Ref Range   VITD 39.55 30.00 - 100.00 ng/mL  Urinalysis, Routine w reflex microscopic     Status: Abnormal   Collection Time: 02/19/20  9:01 AM  Result Value Ref Range   Color, Urine YELLOW YELLOW    APPearance CLEAR CLEAR   Specific Gravity, Urine 1.016 1.001 - 1.03   pH 5.5 5.0 - 8.0   Glucose, UA NEGATIVE NEGATIVE   Bilirubin Urine NEGATIVE NEGATIVE   Ketones, ur NEGATIVE NEGATIVE   Hgb urine dipstick NEGATIVE NEGATIVE   Protein, ur NEGATIVE NEGATIVE   Nitrite NEGATIVE NEGATIVE   Leukocytes,Ua 1+ (A) NEGATIVE   WBC, UA 6-10 (A) 0 - 5 /HPF   RBC / HPF NONE SEEN 0 - 2 /HPF   Squamous Epithelial / LPF 6-10 (A) < OR = 5 /HPF   Bacteria, UA NONE SEEN NONE SEEN /HPF   Hyaline Cast NONE SEEN NONE SEEN /LPF  TSH     Status: None   Collection Time: 02/19/20  9:01 AM  Result Value Ref Range   TSH 2.99 0.35 - 4.50 uIU/mL  Lipid panel     Status: None   Collection Time: 02/19/20  9:01 AM  Result Value Ref Range   Cholesterol 173 0 - 200 mg/dL    Comment: ATP III Classification       Desirable:  < 200 mg/dL               Borderline High:  200 - 239 mg/dL          High:  > = 240 mg/dL   Triglycerides 82.0 0 - 149 mg/dL    Comment: Normal:  <150 mg/dLBorderline High:  150 - 199 mg/dL   HDL 61.80 >39.00 mg/dL   VLDL 16.4 0.0 - 40.0 mg/dL   LDL Cholesterol 95 0 - 99 mg/dL   Total CHOL/HDL Ratio 3     Comment:                Men          Women1/2 Average Risk     3.4  3.3Average Risk          5.0          4.42X Average Risk          9.6          7.13X Average Risk          15.0          11.0                       NonHDL 111.20     Comment: NOTE:  Non-HDL goal should be 30 mg/dL higher than patient's LDL goal (i.e. LDL goal of < 70 mg/dL, would have non-HDL goal of < 100 mg/dL)  CBC with Differential/Platelet     Status: Abnormal   Collection Time: 02/19/20  9:01 AM  Result Value Ref Range   WBC 5.9 4.0 - 10.5 K/uL   RBC 4.45 3.87 - 5.11 Mil/uL   Hemoglobin 13.2 12.0 - 15.0 g/dL   HCT 40.0 36 - 46 %   MCV 89.9 78.0 - 100.0 fl   MCHC 33.0 30.0 - 36.0 g/dL   RDW 13.7 11.5 - 15.5 %   Platelets 135.0 (L) 150 - 400 K/uL   Neutrophils Relative % 51.7 43 - 77 %   Lymphocytes  Relative 38.7 12 - 46 %   Monocytes Relative 8.3 3 - 12 %   Eosinophils Relative 1.1 0 - 5 %   Basophils Relative 0.2 0 - 3 %   Neutro Abs 3.1 1.4 - 7.7 K/uL   Lymphs Abs 2.3 0.7 - 4.0 K/uL   Monocytes Absolute 0.5 0.1 - 1.0 K/uL   Eosinophils Absolute 0.1 0.0 - 0.7 K/uL   Basophils Absolute 0.0 0.0 - 0.1 K/uL  Comprehensive metabolic panel     Status: None   Collection Time: 02/19/20  9:01 AM  Result Value Ref Range   Sodium 144 135 - 145 mEq/L   Potassium 4.4 3.5 - 5.1 mEq/L   Chloride 108 96 - 112 mEq/L   CO2 28 19 - 32 mEq/L   Glucose, Bld 81 70 - 99 mg/dL   BUN 10 6 - 23 mg/dL   Creatinine, Ser 0.64 0.40 - 1.20 mg/dL   Total Bilirubin 0.7 0.2 - 1.2 mg/dL   Alkaline Phosphatase 53 39 - 117 U/L   AST 19 0 - 37 U/L   ALT 11 0 - 35 U/L   Total Protein 6.4 6.0 - 8.3 g/dL   Albumin 4.0 3.5 - 5.2 g/dL   GFR 111.41 >60.00 mL/min   Calcium 9.3 8.4 - 10.5 mg/dL   Objective  Body mass index is 26.46 kg/m. Wt Readings from Last 3 Encounters:  03/11/20 159 lb (72.1 kg)  03/01/20 163 lb (73.9 kg)  09/12/19 176 lb 3.2 oz (79.9 kg)   Temp Readings from Last 3 Encounters:  03/11/20 97.6 F (36.4 C) (Oral)  09/12/19 (!) 97.5 F (36.4 C) (Temporal)  02/17/19 98.3 F (36.8 C)   BP Readings from Last 3 Encounters:  03/11/20 92/62  03/01/20 (!) 97/59  09/12/19 110/68   Pulse Readings from Last 3 Encounters:  03/11/20 68  03/01/20 61  09/12/19 76    Physical Exam Vitals reviewed.  Constitutional:      Appearance: Normal appearance. She is well-developed, well-groomed and overweight.  HENT:     Head: Normocephalic and atraumatic.  Eyes:     Conjunctiva/sclera: Conjunctivae normal.     Pupils: Pupils are equal, round,  and reactive to light.  Cardiovascular:     Rate and Rhythm: Normal rate and regular rhythm.     Heart sounds: Normal heart sounds. No murmur heard.   Pulmonary:     Effort: Pulmonary effort is normal.     Breath sounds: Normal breath sounds.  Skin:     General: Skin is warm and dry.  Neurological:     General: No focal deficit present.     Mental Status: She is alert and oriented to person, place, and time. Mental status is at baseline.     Gait: Gait normal.  Psychiatric:        Attention and Perception: Attention and perception normal.        Mood and Affect: Mood and affect normal.        Speech: Speech normal.        Behavior: Behavior normal. Behavior is cooperative.        Thought Content: Thought content normal.        Cognition and Memory: Cognition and memory normal.        Judgment: Judgment normal.     Assessment  Plan  Annual physical exam Never had flu shot  ? Date HPV Tdaputd Hold covid 19 vaccine per pt per ob/gyn  3/3hep B vaccines hep B protected  STD check 03/2017 [facility] neg f/u with Dr. Leafy Ro -pap 04/22/18 negative no HPV comment [facility] appt sch 03/2020   Dentist Joe Harns Dr. Gabriel Carina notes reviewed 11/05/17 for Graves dx'ed 2005 tx'ed methimazole until 12/2013 TSH and T4 remained normal labs 2016, 2017, 2019 labs. Graves in remission though labs checked f/u based on labs  Thrombocytopenia ([place]) - Plan: CBC with Differential/Platelet  Seasonal allergies - Plan: montelukast (SINGULAIR) 10 MG tablet Consider allergy in future   Migraine without aura and without status migrainosus, not intractable  F/u neurology consider emgality  maxalt helped in the past but imitrex helped as well and excedrin otc helps   Lomax, Amy, NP  McLean-Scocuzza, [name], MD; Melvenia Beam, MD Thank you for replying. I was unable to see your completed note with original message but can now. She did reach out about symptoms 1 day following 1st Ajovy injection but these were not typical side effects reported with Ajovy (restless sleep, tremor, tightness of jaw). I see that she mentioned her tongue felt funny and was having brain fog to you. Symptoms definitely could be related to migraine symptoms. I reached back out  to her to explain but have not heard back from her. I will reach out again to address. She has tried and failed multiple preventatives. We can definitely try Emgality. Dr Jaynee Eagles and I have also discussed option to try Botox but she is hesitant. We will consider revisit with Dr Jaynee Eagles or referral to headache clinic as well. I am including Dr Jaynee Eagles in this message as well in case she has any new information to provide. Thank you for your assistance in her care.   Amy   Dr. Penni Homans, I would offer to send her to [facility] or [facility]. We have been seeing her for a while and can't seem to help her and so I think its time to let someone else take a look to make sure we are not missing anything. 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 Combine that with intractable migraines and there is  a lot of literature to suggest there may untreated mood disorder or a history of abuse that may need to be explored. Dr. Terese Door let us know what your opinion is, thanks.   Provider: Dr. [name] McLean-Scocuzza-Internal Medicine

## 2020-03-11 NOTE — Patient Instructions (Addendum)
Consider allergy in the future  -let me know and always price check if they do allergy testing   xyzal if zyrtec does not work    Air cabin crew tablet helped  Consider Nurtec   Galcanezumab injection What is this medicine? GALCANEZUMAB (gal ka NEZ ue mab) is used to prevent migraines and treat cluster headaches. This medicine may be used for other purposes; ask your health care provider or pharmacist if you have questions. COMMON BRAND NAME(S): Emgality What should I tell my health care provider before I take this medicine? They need to know if you have any of these conditions:  an unusual or allergic reaction to galcanezumab, other medicines, foods, dyes, or preservatives  pregnant or trying to get pregnant  breast-feeding How should I use this medicine? This medicine is for injection under the skin. You will be taught how to prepare and give this medicine. Use exactly as directed. Take your medicine at regular intervals. Do not take your medicine more often than directed. It is important that you put your used needles and syringes in a special sharps container. Do not put them in a trash can. If you do not have a sharps container, call your pharmacist or healthcare provider to get one. Talk to your pediatrician regarding the use of this medicine in children. Special care may be needed. Overdosage: If you think you have taken too much of this medicine contact a poison control center or emergency room at once. NOTE: This medicine is only for you. Do not share this medicine with others. What if I miss a dose? If you miss a dose, take it as soon as you can. If it is almost time for your next dose, take only that dose. Do not take double or extra doses. What may interact with this medicine? Interactions are not expected. This list may not describe all possible interactions. Give your health care provider a list of all the medicines, herbs, non-prescription drugs, or dietary  supplements you use. Also tell them if you smoke, drink alcohol, or use illegal drugs. Some items may interact with your medicine. What should I watch for while using this medicine? Tell your doctor or healthcare professional if your symptoms do not start to get better or if they get worse. What side effects may I notice from receiving this medicine? Side effects that you should report to your doctor or health care professional as soon as possible:  allergic reactions like skin rash, itching or hives, swelling of the face, lips, or tongue Side effects that usually do not require medical attention (report these to your doctor or health care professional if they continue or are bothersome):  pain, redness, or irritation at site where injected This list may not describe all possible side effects. Call your doctor for medical advice about side effects. You may report side effects to FDA at 1-800-FDA-1088. Where should I keep my medicine? Keep out of the reach of children. You will be instructed on how to store this medicine. Throw away any unused medicine after the expiration date on the label. NOTE: This sheet is a summary. It may not cover all possible information. If you have questions about this medicine, talk to your doctor, pharmacist, or health care provider.  2020 Elsevier/Gold Standard (2017-10-31 12:03:23)    Thrombocytopenia Thrombocytopenia is a condition in which you have a low number of platelets in your blood. Platelets are also called thrombocytes. Platelets are tiny cells in the blood. When you bleed, they  clump together at the cut or injury to stop the bleeding. This is called blood clotting. Not having enough platelets can cause bleeding problems. Some cases of thrombocytopenia are mild while others are more severe. What are the causes? This condition may be caused by:  Decreased production of platelets. This may be caused by: ? Aplastic anemia. This is when your bone marrow  stops making blood cells. ? Cancer in the bone marrow. ? Certain medicines, including chemotherapy. ? Infection in the bone marrow. ? Drinking a lot of alcohol.  Increased destruction of platelets. This may be caused by: ? Certain immune diseases. ? Certain medicines. ? Certain blood clotting disorders. ? Certain inherited disorders. ? Certain bleeding disorders. ? Pregnancy. ? Having an enlarged spleen (hypersplenism). In hypersplenism, the spleen gathers up platelets from circulation. This means that the platelets are not available to help with blood clotting. The spleen can be enlarged because of cirrhosis or other conditions. What are the signs or symptoms? Symptoms of this condition are the result of poor blood clotting. They will vary depending on how low the platelet counts are. Symptoms may include:  Abnormal bleeding.  Nosebleeds.  Heavy menstrual periods.  Blood in the urine or stool (feces).  A purplish discoloration in the skin (purpura).  Bruising.  A rash that looks like pinpoint, purplish-red spots (petechiae) on the skin and mucous membranes. How is this diagnosed?  This condition may be diagnosed with blood tests and a physical exam. Sometimes, a sample of bone marrow may be removed to look for the original cells (megakaryocytes) that make platelets. Other tests may be needed depending on the cause. How is this treated? Treatment for this condition depends on the cause. Treatment options may include:  Treatment of another condition that is causing the low platelet count.  Medicines to help protect your platelets from being destroyed.  A replacement (transfusion) of platelets to stop or prevent bleeding.  Surgery to remove the spleen. Follow these instructions at home: Activity  Avoid activities that could cause injury or bruising, and follow instructions about how to prevent falls.  Take extra care not to cut yourself when you shave or when you use  scissors, needles, knives, and other tools.  Take extra care to protect yourself from burns when ironing or cooking. General instructions   Check your skin and the inside of your mouth for bruising or bleeding as told by your health care provider.  Check your spit (sputum), urine, and stool for blood as told by your health care provider.  Do not drink alcohol.  Take over-the-counter and prescription medicines only as told by your health care provider.  Do not take any medicines that have aspirin or NSAIDs in them. These medicines can thin your blood and cause you to bleed more easily.  Tell all your health care providers, including dentists and eye doctors, about your condition. Contact a health care provider if you have:  Unexplained bruising. Get help right away if you have:  Active bleeding from anywhere on your body.  Blood in your sputum, urine, or stool. Summary  Thrombocytopenia is a condition in which you have a low number of platelets in your blood.  Platelets are needed for blood clotting.  Symptoms of this condition are the result of poor blood clotting and may include abnormal bleeding, nosebleeds, and bruising.  This condition may be diagnosed with blood tests and a physical exam.  Treatment for this condition depends on the cause. This information is  not intended to replace advice given to you by your health care provider. Make sure you discuss any questions you have with your health care provider. Document Revised: 02/14/2018 Document Reviewed: 02/14/2018 Elsevier Patient Education  [place].

## 2020-03-11 NOTE — Telephone Encounter (Signed)
I have attempted to reach her via Mychart and via telephone. She was advised to call back to discuss treatment regimen.

## 2020-03-11 NOTE — Telephone Encounter (Signed)
-----   Message from Delorise Jackson, MD sent at 03/11/2020  1:47 PM EDT ----- She had reaction to ajovy see my note and I think she sent you a message thanks ----- Message ----- From: Debbora Presto, NP Sent: 03/11/2020  12:50 PM EDT To: [name] McLean-Scocuzza, MD  Good afternoon. I saw her recently and we decided to try Ajovy. She has tried and failed Amovig in the past. I seem to have more clinical improvement with Ajovy but each patient is different. Emgality is very similar, we just opted for Ajovy. Is there any concerns with starting Ajovy?   Daemon Dowty  ----- Message ----- From: McLean-Scocuzza, [name], MD Sent: 03/11/2020  11:35 AM EDT To: Debbora Presto, NP  What about emgality for h/a?

## 2020-03-15 ENCOUNTER — Telehealth: Payer: Self-pay | Admitting: Family Medicine

## 2020-03-15 NOTE — Telephone Encounter (Signed)
Can you please reach out to patient. I have tried calling and have sent a message via Mychart but have not heard back. I have spoken with Dr McLean-Scocuzza and Dr Jaynee Eagles. We are all in agreement that it may be beneficial to consider a referral to a headache specialist due to her complex headaches that have not been responsive to therapy. If she is willing, I am happy to place referral.

## 2020-03-15 NOTE — Telephone Encounter (Signed)
-----   Message from Delorise Jackson, MD sent at 03/15/2020  8:08 AM EDT ----- I agree with [facility] or [facility] if you all want to reach out to patient and office  She has been unsuccessful with a lot of meds for migraines so I was thinking about Emgality and she did not quite yet want to do botox   As long as providers are in network she may be ok with this  ----- Message ----- From: Melvenia Beam, MD Sent: 03/11/2020   5:31 PM EDT To: Debbora Presto, NP, [name] McLean-Scocuzza, MD  Randy Whitener, I would offer to send her to [facility] or [place]. We have been seeing her for a while and can't seem to help her and so I think its time to let someone else take a look to make sure we are not missing anything. 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 Combine that with intractable migraines and there is a lot of literature to suggest there may untreated mood disorder or a history of abuse that may need to be explored. Dr. McLean-Scocuzza let us know what your opinion is, thanks.   ----- Message ----- From: Debbora Presto, NP Sent: 03/11/2020   2:32 PM EDT To: Melvenia Beam, MD, #  Thank you for replying. I was unable to see your completed note with original message but can now. She did reach out about symptoms 1 day following 1st Ajovy injection but these were not typical side effects reported with Ajovy (restless sleep, tremor, tightness of jaw). I see that she mentioned her tongue felt funny and was having brain fog to you. Symptoms definitely could be related to migraine symptoms. I reached back out to her to explain but have not heard back from her. I will reach out again to address. She has tried and failed multiple preventatives. We can definitely try Emgality. Dr Jaynee Eagles and I have also discussed option to try Botox but she is hesitant. We will consider revisit with Dr Jaynee Eagles or referral to headache  clinic as well. I am including Dr Jaynee Eagles in this message as well in case she has any new information to provide. Thank you for your assistance in her care.   Kalee Broxton   ----- Message ----- From: McLean-Scocuzza, [name], MD Sent: 03/11/2020   1:47 PM EDT To: Debbora Presto, NP  She had reaction to ajovy see my note and I think she sent you a message thanks ----- Message ----- From: Debbora Presto, NP Sent: 03/11/2020  12:50 PM EDT To: [name] McLean-Scocuzza, MD  Good afternoon. I saw her recently and we decided to try Ajovy. She has tried and failed Amovig in the past. I seem to have more clinical improvement with Ajovy but each patient is different. Emgality is very similar, we just opted for Ajovy. Is there any concerns with starting Ajovy?   Arian Mcquitty  ----- Message ----- From: McLean-Scocuzza, [name], MD Sent: 03/11/2020  11:35 AM EDT To: Debbora Presto, NP  What about emgality for h/a?

## 2020-03-16 ENCOUNTER — Ambulatory Visit: Payer: BC Managed Care – PPO | Admitting: Internal Medicine

## 2020-03-16 NOTE — Telephone Encounter (Signed)
LMVM for pt to return call. (2nd time).

## 2020-03-16 NOTE — Telephone Encounter (Signed)
LMVM for pt to return call (mobile) concerning migraine headaches and possible referral.

## 2020-03-17 ENCOUNTER — Encounter: Payer: Self-pay | Admitting: *Deleted

## 2020-03-17 NOTE — Telephone Encounter (Signed)
Mailed letter to pt

## 2020-03-25 ENCOUNTER — Other Ambulatory Visit (INDEPENDENT_AMBULATORY_CARE_PROVIDER_SITE_OTHER): Payer: BC Managed Care – PPO

## 2020-03-25 ENCOUNTER — Other Ambulatory Visit: Payer: Self-pay

## 2020-03-25 DIAGNOSIS — D696 Thrombocytopenia, unspecified: Secondary | ICD-10-CM

## 2020-03-25 LAB — CBC WITH DIFFERENTIAL/PLATELET
Basophils Absolute: 0 10*3/uL (ref 0.0–0.1)
Basophils Relative: 0.3 % (ref 0.0–3.0)
Eosinophils Absolute: 0.1 10*3/uL (ref 0.0–0.7)
Eosinophils Relative: 0.9 % (ref 0.0–5.0)
HCT: 39.4 % (ref 36.0–46.0)
Hemoglobin: 13.1 g/dL (ref 12.0–15.0)
Lymphocytes Relative: 28.9 % (ref 12.0–46.0)
Lymphs Abs: 2.4 10*3/uL (ref 0.7–4.0)
MCHC: 33.2 g/dL (ref 30.0–36.0)
MCV: 88.9 fl (ref 78.0–100.0)
Monocytes Absolute: 0.7 10*3/uL (ref 0.1–1.0)
Monocytes Relative: 8.5 % (ref 3.0–12.0)
Neutro Abs: 5 10*3/uL (ref 1.4–7.7)
Neutrophils Relative %: 61.4 % (ref 43.0–77.0)
Platelets: 153 10*3/uL (ref 150.0–400.0)
RBC: 4.43 Mil/uL (ref 3.87–5.11)
RDW: 13.8 % (ref 11.5–15.5)
WBC: 8.2 10*3/uL (ref 4.0–10.5)

## 2020-05-29 NOTE — L&D Delivery Note (Addendum)
Operative Delivery Note At  a +3/3viable female was delivered via .  Presentation: vertex; Position: Left,, Occiput,, Anterior;45 degrees off midline  Station: +3/3  Verbal consent: obtained from patient.  Risks and benefits discussed in detail.  Risks include, but are not limited to the risks of anesthesia, bleeding, infection, damage to maternal tissues, fetal cephalhematoma.  There is also the risk of inability to effect vaginal delivery of the head, or shoulder dystocia that cannot be resolved by established maneuvers, leading to the need for emergency cesarean section.  APGAR: 7/8, ; weight  .   Placenta status: , .  intact Cord:  with the following complications: .  Cord pH: 7.03/69/18  Anesthesia:  CLE Instruments: simpson forceps  Episiotomy:  MLE Lacerations:  bilateral sulcus , and total third degree laceration  Suture Repair: 2.0 3.0 vicryl Est. Blood Loss (mL):  QBL 390 cc  Mom to postpartum.  Baby to Couplet care / Skin to Skin.    SEE Dictated operative vag Note    Gwen Her Shanita Kanan 05/13/2021, 6:48 PM  Gwen Her Darienne Belleau 05/13/2021, 6:48 PM

## 2020-06-07 ENCOUNTER — Telehealth: Payer: BC Managed Care – PPO | Admitting: Family

## 2020-06-07 DIAGNOSIS — J069 Acute upper respiratory infection, unspecified: Secondary | ICD-10-CM | POA: Diagnosis not present

## 2020-06-07 MED ORDER — FLUTICASONE PROPIONATE 50 MCG/ACT NA SUSP: 2.0000 | Freq: Every day | NASAL | 6 refills | Status: DC

## 2020-06-07 NOTE — Progress Notes (Signed)

## 2020-06-10 ENCOUNTER — Telehealth: Payer: BC Managed Care – PPO | Admitting: Family

## 2020-06-10 DIAGNOSIS — J019 Acute sinusitis, unspecified: Secondary | ICD-10-CM | POA: Diagnosis not present

## 2020-06-10 MED ORDER — AMOXICILLIN-POT CLAVULANATE 875-125 MG PO TABS: 1.0000 | ORAL_TABLET | Freq: Two times a day (BID) | ORAL | 0 refills | Status: AC

## 2020-06-10 NOTE — Progress Notes (Signed)
We are sorry that you are not feeling well.  Here is how we plan to help! ° °Based on what you have shared with me it looks like you have sinusitis.  Sinusitis is inflammation and infection in the sinus cavities of the head.  Based on your presentation I believe you most likely have Acute Bacterial Sinusitis.  This is an infection caused by bacteria and is treated with antibiotics. I have prescribed Augmentin 875mg/125mg one tablet twice daily with food, for 7 days. You may use an oral decongestant such as Mucinex D or if you have glaucoma or high blood pressure use plain Mucinex. Saline nasal spray help and can safely be used as often as needed for congestion.  If you develop worsening sinus pain, fever or notice severe headache and vision changes, or if symptoms are not better after completion of antibiotic, please schedule an appointment with a health care provider.   ° °Sinus infections are not as easily transmitted as other respiratory infection, however we still recommend that you avoid close contact with loved ones, especially the very young and elderly.  Remember to wash your hands thoroughly throughout the day as this is the number one way to prevent the spread of infection! ° °Home Care: °· Only take medications as instructed by your medical team. °· Complete the entire course of an antibiotic. °· Do not take these medications with alcohol. °· A steam or ultrasonic humidifier can help congestion.  You can place a towel over your head and breathe in the steam from hot water coming from a faucet. °· Avoid close contacts especially the very young and the elderly. °· Cover your mouth when you cough or sneeze. °· Always remember to wash your hands. ° °Get Help Right Away If: °· You develop worsening fever or sinus pain. °· You develop a severe head ache or visual changes. °· Your symptoms persist after you have completed your treatment plan. ° °Make sure you °· Understand these instructions. °· Will watch your  condition. °· Will get help right away if you are not doing well or get worse. ° °Your e-visit answers were reviewed by a board certified advanced clinical practitioner to complete your personal care plan.  Depending on the condition, your plan could have included both over the counter or prescription medications. ° °If there is a problem please reply  once you have received a response from your provider. ° °Your safety is important to us.  If you have drug allergies check your prescription carefully.   ° °You can use MyChart to ask questions about today’s visit, request a non-urgent call back, or ask for a work or school excuse for 24 hours related to this e-Visit. If it has been greater than 24 hours you will need to follow up with your provider, or enter a new e-Visit to address those concerns. ° °You will get an e-mail in the next two days asking about your experience.  I hope that your e-visit has been valuable and will speed your recovery. Thank you for using e-visits. ° °Greater than 5 minutes, yet less than 10 minutes of time have been spent researching, coordinating, and implementing care for this patient.  ° ° ° °

## 2020-07-24 IMAGING — CT CT ABDOMEN AND PELVIS WITH CONTRAST
2 of 4 series · 16 of 46 positions shown, 18 images · IV contrast (APPLIED)
Comparison: None.

CLINICAL DATA: Right lower quadrant pain x1 week, prior
appendectomy

EXAM:
CT ABDOMEN AND PELVIS WITH CONTRAST
TECHNIQUE: Multidetector CT imaging of the abdomen and pelvis was performed
using the standard protocol following bolus administration of
intravenous contrast.
CONTRAST:  100mL OMNIPAQUE IOHEXOL 300 MG/ML  SOLN

[Series 2: routine abd/pel with · axial · 0.83mm/px · z∈[-474,-18]mm · 13 of 101 slices shown, 15 images]
[im 5/101  soft-tissue]
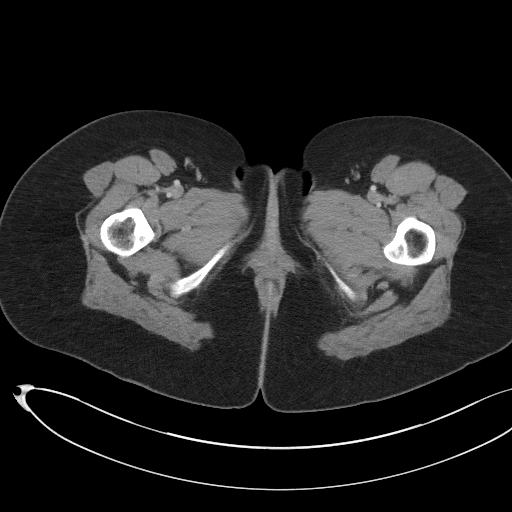
[im 5/101  bone]
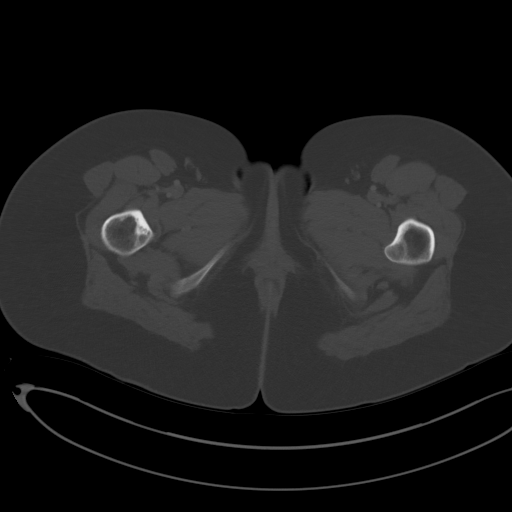
[im 14/101  soft-tissue]
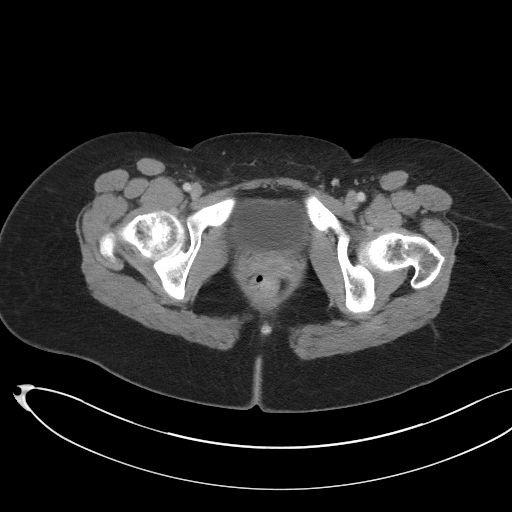
[im 22/101  soft-tissue]
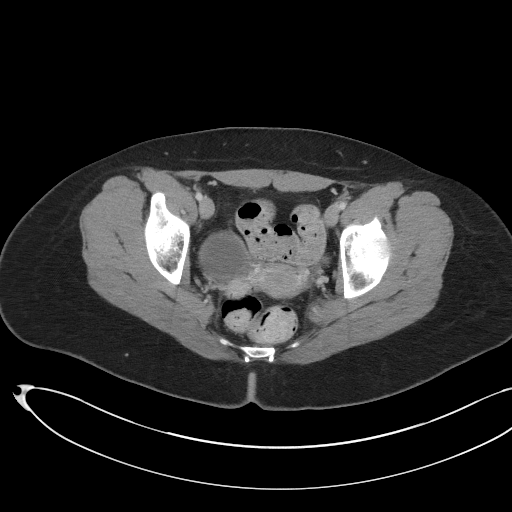
[im 27/101  soft-tissue]
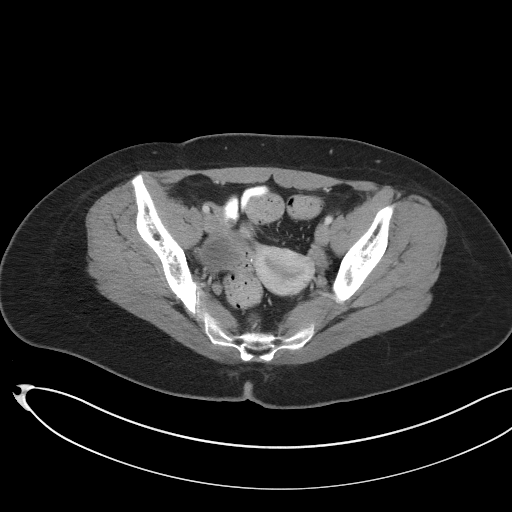
[im 35/101  soft-tissue]
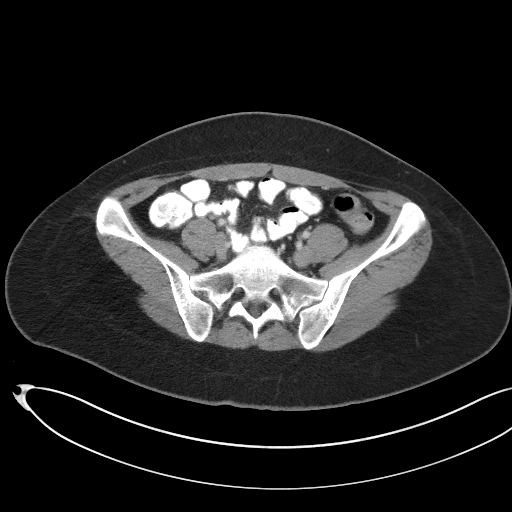
[im 44/101  soft-tissue]
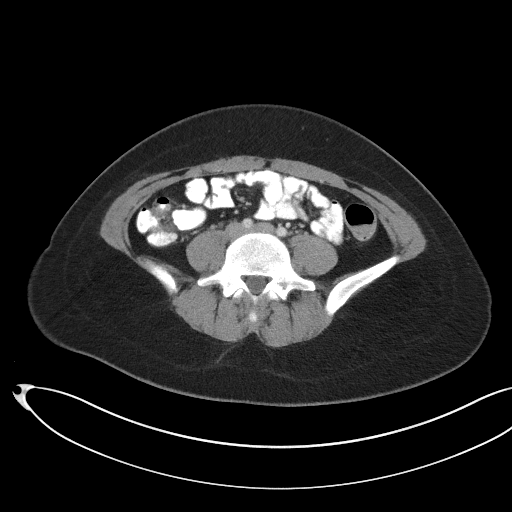
[im 53/101  soft-tissue]
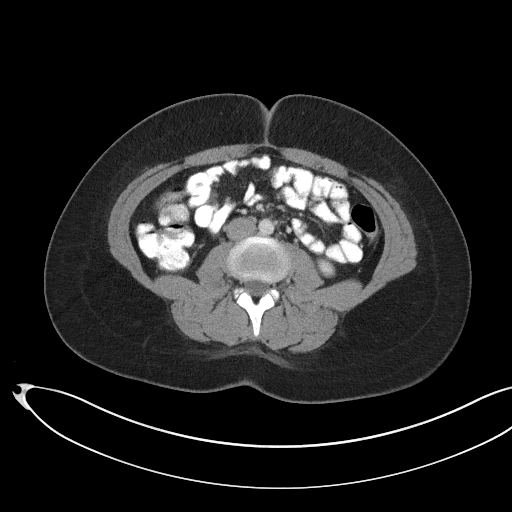
[im 57/101  soft-tissue]
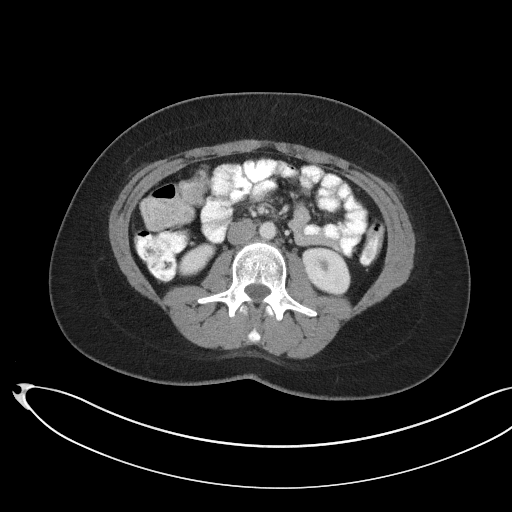
[im 66/101  soft-tissue]
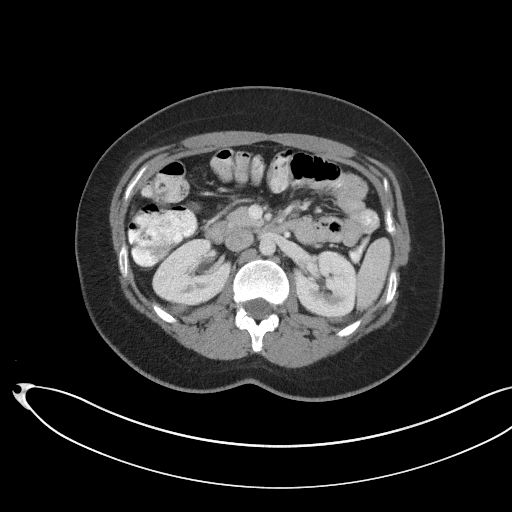
[im 66/101  bone]
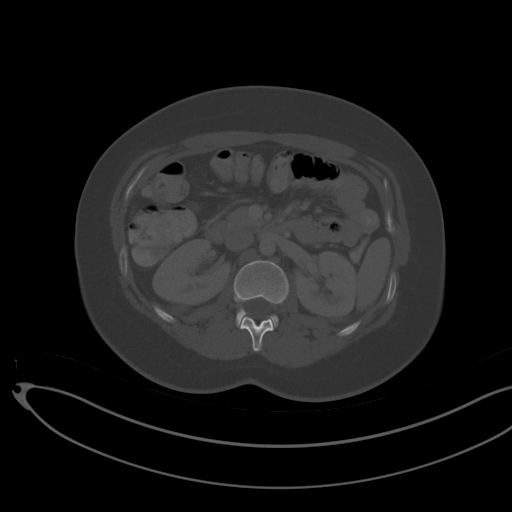
[im 74/101  soft-tissue]
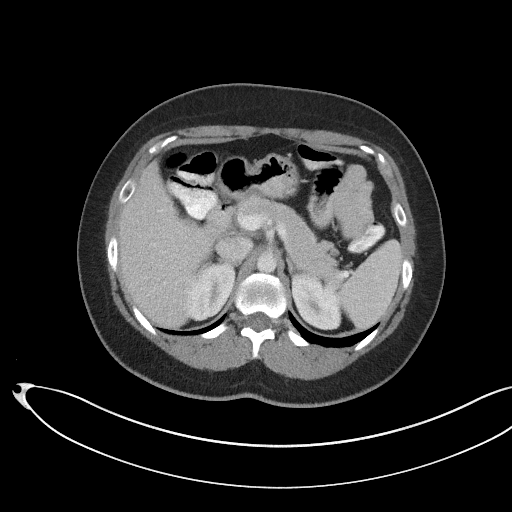
[im 79/101  soft-tissue]
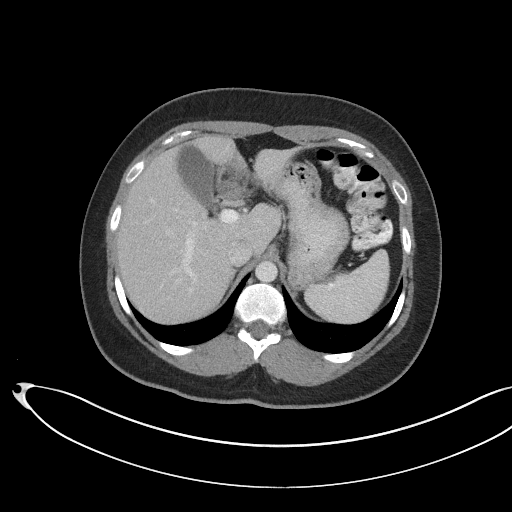
[im 87/101  soft-tissue]
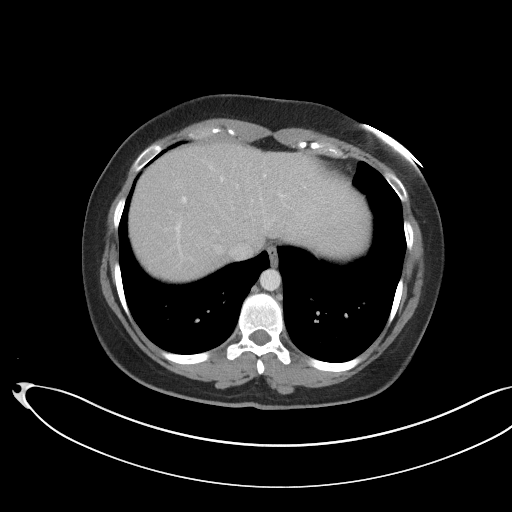
[im 96/101  soft-tissue]
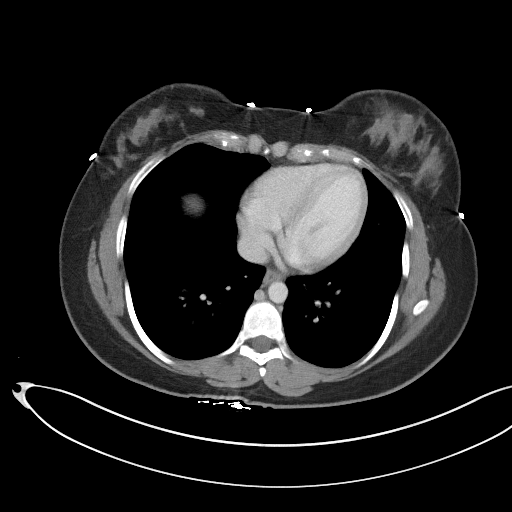

[Series 5: coronal st · coronal · 0.89mm/px · 3 of 84 slices shown]
[im 28/84  soft-tissue]
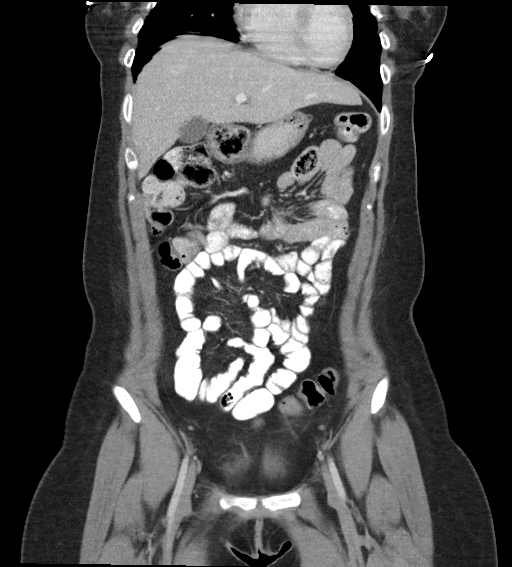
[im 37/84  soft-tissue]
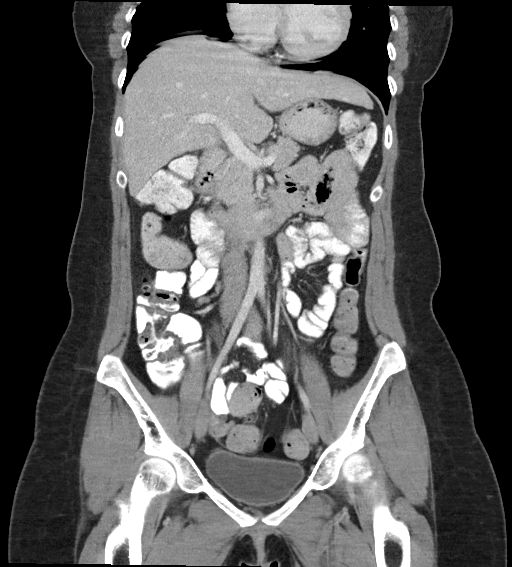
[im 47/84  soft-tissue]
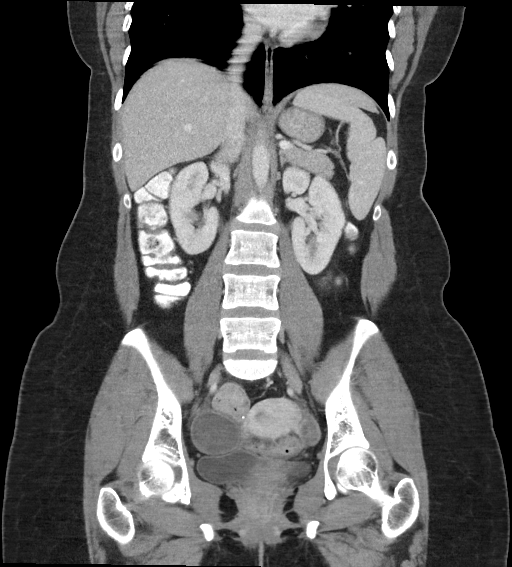

[16 of 46 positions shown; findings below may reference images not displayed]

FINDINGS: Lower chest: Lung bases are clear.

Hepatobiliary: Liver is within normal limits.

Gallbladder is unremarkable. No intrahepatic or extrahepatic duct
dilatation.

Pancreas: Within normal limits.

Spleen: Within normal limits.

Adrenals/Urinary Tract: Adrenal glands are within normal limits.

2.1 cm left upper pole renal cyst (series 2/image 31). Right kidney
is within normal limits. No renal calculi or hydronephrosis.

Bladder is within normal limits.

Stomach/Bowel: Stomach is within normal limits.

No evidence of bowel obstruction.

Prior appendectomy.

Vascular/Lymphatic: No evidence of abdominal aortic aneurysm.

No suspicious abdominopelvic lymphadenopathy.

Reproductive: Uterus and left ovary are within normal limits.

4.4 cm right ovarian cyst/follicle (series 2/image 78), likely
physiologic.

Other: No abdominopelvic ascites.

Musculoskeletal: Visualized osseous structures are within normal
limits.
IMPRESSION: 4.4 cm right ovarian cyst/follicle, likely physiologic. However,
this could account for the patient's right lower quadrant pain.
Consider follow-up pelvic ultrasound in 6-10 weeks to confirm
reduction in size/resolution.

If symptoms persist acutely and there is concern for torsion,
consider pelvic ultrasound with Doppler for further evaluation.

## 2020-09-02 ENCOUNTER — Ambulatory Visit: Payer: BC Managed Care – PPO | Admitting: Family Medicine

## 2020-09-09 ENCOUNTER — Ambulatory Visit: Payer: BC Managed Care – PPO | Admitting: Family Medicine

## 2020-09-12 ENCOUNTER — Encounter: Payer: Self-pay | Admitting: Intensive Care

## 2020-09-12 ENCOUNTER — Other Ambulatory Visit: Payer: Self-pay

## 2020-09-12 ENCOUNTER — Emergency Department: Payer: BC Managed Care – PPO

## 2020-09-12 ENCOUNTER — Emergency Department
Admission: EM | Admit: 2020-09-12 | Discharge: 2020-09-12 | Disposition: A | Discharge status: Home / Self Care | Payer: BC Managed Care – PPO | Attending: Emergency Medicine | Admitting: Emergency Medicine

## 2020-09-12 DIAGNOSIS — O26891 Other specified pregnancy related conditions, first trimester: Secondary | ICD-10-CM | POA: Insufficient documentation

## 2020-09-12 DIAGNOSIS — O4691 Antepartum hemorrhage, unspecified, first trimester: Secondary | ICD-10-CM | POA: Diagnosis present

## 2020-09-12 DIAGNOSIS — Z91040 Latex allergy status: Secondary | ICD-10-CM | POA: Insufficient documentation

## 2020-09-12 DIAGNOSIS — Z3A01 Less than 8 weeks gestation of pregnancy: Secondary | ICD-10-CM | POA: Insufficient documentation

## 2020-09-12 DIAGNOSIS — O200 Threatened abortion: Secondary | ICD-10-CM | POA: Insufficient documentation

## 2020-09-12 DIAGNOSIS — R102 Pelvic and perineal pain: Secondary | ICD-10-CM

## 2020-09-12 DIAGNOSIS — N939 Abnormal uterine and vaginal bleeding, unspecified: Secondary | ICD-10-CM

## 2020-09-12 DIAGNOSIS — R110 Nausea: Secondary | ICD-10-CM | POA: Insufficient documentation

## 2020-09-12 LAB — URINALYSIS, COMPLETE (UACMP) WITH MICROSCOPIC
Bacteria, UA: NONE SEEN
Bilirubin Urine: NEGATIVE
Glucose, UA: NEGATIVE mg/dL
Hgb urine dipstick: NEGATIVE
Ketones, ur: NEGATIVE mg/dL
Leukocytes,Ua: NEGATIVE
Nitrite: NEGATIVE
Protein, ur: NEGATIVE mg/dL
Specific Gravity, Urine: 1.009 (ref 1.005–1.030)
Squamous Epithelial / HPF: NONE SEEN (ref 0–5)
pH: 9 — ABNORMAL HIGH (ref 5.0–8.0)

## 2020-09-12 LAB — COMPREHENSIVE METABOLIC PANEL
ALT: 42 U/L (ref 0–44)
AST: 43 U/L — ABNORMAL HIGH (ref 15–41)
Albumin: 4.4 g/dL (ref 3.5–5.0)
Alkaline Phosphatase: 68 U/L (ref 38–126)
Anion gap: 8 (ref 5–15)
BUN: 14 mg/dL (ref 6–20)
CO2: 27 mmol/L (ref 22–32)
Calcium: 9.6 mg/dL (ref 8.9–10.3)
Chloride: 105 mmol/L (ref 98–111)
Creatinine, Ser: 0.64 mg/dL (ref 0.44–1.00)
GFR, Estimated: >60 mL/min (ref >60)
Glucose, Bld: 81 mg/dL (ref 70–99)
Potassium: 4.6 mmol/L (ref 3.5–5.1)
Sodium: 140 mmol/L (ref 135–145)
Total Bilirubin: 1 mg/dL (ref 0.3–1.2)
Total Protein: 7 g/dL (ref 6.5–8.1)

## 2020-09-12 LAB — CBC WITH DIFFERENTIAL/PLATELET
Abs Immature Granulocytes: 0.03 10*3/uL (ref 0.00–0.07)
Basophils Absolute: 0 10*3/uL (ref 0.0–0.1)
Basophils Relative: 0 %
Eosinophils Absolute: 0 10*3/uL (ref 0.0–0.5)
Eosinophils Relative: 1 %
HCT: 39.9 % (ref 36.0–46.0)
Hemoglobin: 13.2 g/dL (ref 12.0–15.0)
Immature Granulocytes: 0 %
Lymphocytes Relative: 37 %
Lymphs Abs: 2.9 10*3/uL (ref 0.7–4.0)
MCH: 29.3 pg (ref 26.0–34.0)
MCHC: 33.1 g/dL (ref 30.0–36.0)
MCV: 88.5 fL (ref 80.0–100.0)
Monocytes Absolute: 0.7 10*3/uL (ref 0.1–1.0)
Monocytes Relative: 9 %
Neutro Abs: 4 10*3/uL (ref 1.7–7.7)
Neutrophils Relative %: 53 %
Platelets: 170 10*3/uL (ref 150–400)
RBC: 4.51 MIL/uL (ref 3.87–5.11)
RDW: 13.5 % (ref 11.5–15.5)
WBC: 7.8 10*3/uL (ref 4.0–10.5)
nRBC: 0 % (ref 0.0–0.2)

## 2020-09-12 LAB — WET PREP, GENITAL
Clue Cells Wet Prep HPF POC: NONE SEEN
Sperm: NONE SEEN
Trich, Wet Prep: NONE SEEN
Yeast Wet Prep HPF POC: NONE SEEN

## 2020-09-12 LAB — ABO/RH: ABO/RH(D): A POS

## 2020-09-12 LAB — HCG, QUANTITATIVE, PREGNANCY: hCG, Beta Chain, Quant, S: 23116 m[IU]/mL — ABNORMAL HIGH (ref <5)

## 2020-09-12 NOTE — ED Provider Notes (Signed)
  Physical Exam  BP 105/65   Pulse 88   Temp 98.1 F (36.7 C)   Resp 16   Ht 5\' 5"  (1.651 m)   Wt 74.8 kg   LMP 08/01/2020 (Exact Date)   SpO2 100%   BMI 27.46 kg/m    ED Course/Procedures      MDM  In short, I am assuming care from [facility], PA-C.  This is a patient who believes she is roughly 6 to [redacted] weeks pregnant, here for vaginal discharge and bleeding.  See HPI and Jenice's note for further details.  Plan at this time is pending ultrasound.  Ultrasound reveals an early IUP without evidence of cardiac activity at this time, which could be related to early dating versus pregnancy loss.  Quantitative hCG is within appropriate range for her expected gestation.  Recommended close follow-up with OB for serial hCG trend as well as consideration for repeat ultrasound when they deem appropriate.  Return precautions were discussed at length, and the patient is agreeable with this and stable at this time.  Patient will follow-up with [facility] as previously planned.       Tamiah, [place], PA 09/13/20 0017    Naaman Plummer, MD 09/13/20 [phone]

## 2020-09-12 NOTE — ED Provider Notes (Signed)
[facility] Emergency Department Provider Note ____________________________________________  Time seen: 2040  I have reviewed the triage vital signs and the nursing notes.  HISTORY  Chief Complaint  Abdominal Pain and Threatened Miscarriage  HPI Ruth Tran is a 30 y.o. female G1, P0, presents to the ED with vaginal bleeding.   Patient began to have pink then brownish, mucoid vaginal discharge with some associated nausea.  She denies any bright red blood, or any active vaginal bleeding at this time.  She is also had some abdominal cramping that began today.  Past Medical History:  Diagnosis Date  . Allergy   . Chicken pox   . Complication of anesthesia    WITH HER WISDOM TEETH SURGERY SHE HAD TO GO TO THE OR TO HAVE THIS DONE DUE TO TACHYCARDIA DUE TO GRAVES DISEASE  . Complication of anesthesia   . Dysmenorrhea   . Family history of endometriosis    mother,sister  . Graves disease    graves, hyperthyroidism   . Headache    MIGRAINES  . Migraine   . Ovarian cyst   . Panic attack   . UTI (urinary tract infection)     Patient Active Problem List   Diagnosis Date Noted  . Annual physical exam 03/11/2020  . Patellofemoral syndrome of right knee 12/19/2019  . Chronic pain of right knee 09/12/2019  . Allergic rhinitis 09/12/2019  . Ovarian cyst 10/22/2018  . Chronic migraine without aura without status migrainosus, not intractable 04/01/2018  . Constipation 01/21/2018  . Migraine without aura and without status migrainosus, not intractable 09/19/2017  . Hyperthyroidism 09/19/2017  . Seasonal allergies 09/19/2017  . Graves disease 01/22/2014  . Vitamin D deficiency disease 01/22/2014    Past Surgical History:  Procedure Laterality Date  . APPENDECTOMY  2005   WITH DRAIN PLACED DUE TO ABSCESS 2005 [facility]  . BREAST BIOPSY Right 2017   had a reaction to xylo or xylo with epi where her throat closed up  . CHROMOPERTUBATION Right 08/12/2018    Procedure: CHROMOPERTUBATION;  Surgeon: Benjaman Kindler, MD;  Location: [facility] ORS;  Service: Gynecology;  Laterality: Right;  . FOOT SURGERY Bilateral 2008   toes going outward surgery to turn toes inward 2010   . LAPAROSCOPIC OVARIAN CYSTECTOMY Right 08/12/2018   Procedure: LAPAROSCOPIC OVARIAN CYSTECTOMY;  Surgeon: Benjaman Kindler, MD;  Location: [facility] ORS;  Service: Gynecology;  Laterality: Right;  . LYSIS OF ADHESION Right 08/12/2018   Procedure: LYSIS OF ADHESIONS Lysis of Omental Adhesions, right cecum adhesions, right fallopian tube and right adnexal adhesions;  Surgeon: Benjaman Kindler, MD;  Location: [facility] ORS;  Service: Gynecology;  Laterality: Right;  . TONSILLECTOMY AND ADENOIDECTOMY N/A 04/17/2017   Procedure: TONSILLECTOMY AND POSSIBLE  ADENOIDECTOMY;  Surgeon: Beverly Gust, MD;  Location: [facility] ORS;  Service: ENT;  Laterality: N/A;  . WISDOM TOOTH EXTRACTION     2016    Prior to Admission medications   Medication Sig Start Date End Date Taking? Authorizing Provider  cetirizine (ZYRTEC) 10 MG tablet Take 1 tablet (10 mg total) by mouth at bedtime. 09/12/19   McLean-Scocuzza, [name], MD  fluticasone (FLONASE) 50 MCG/ACT nasal spray Place 2 sprays into both nostrils daily. 06/07/20   Evelina Dun A, FNP  hydrocortisone 2.5 % cream Apply topically 2 (two) times daily. Arms as needed 09/12/19   McLean-Scocuzza, [name], MD  hydrOXYzine (ATARAX/VISTARIL) 25 MG tablet Take 1 tablet (25 mg total) by mouth 2 (two) times daily as needed. 09/12/19  McLean-Scocuzza, [name], MD  montelukast (SINGULAIR) 10 MG tablet Take 1 tablet (10 mg total) by mouth at bedtime. 03/11/20   McLean-Scocuzza, [name], MD  norgestimate-ethinyl estradiol (ORTHO-CYCLEN,SPRINTEC,PREVIFEM) 0.25-35 MG-MCG tablet Take 1 tablet by mouth daily. 04/22/18   [provider]  SUMAtriptan (IMITREX) 100 MG tablet Take 1 tablet (100 mg total) by mouth every 2 (two) hours as needed for migraine. May repeat in 2 hours  if headache persists or recurs. 03/01/20   Lomax, Amy, NP    Allergies Lidocaine, Lidocaine-epinephrine, Toradol [ketorolac tromethamine], Tramadol, Latex, Linzess [linaclotide], and Tape  Family History  Problem Relation Age of Onset  . Graves' disease Other   . Diabetes Father   . Hyperlipidemia Father   . Graves' disease Maternal Grandfather   . Miscarriages / Stillbirths Maternal Grandfather   . Cancer Maternal Grandfather        prostate/bladder   . Ovarian cancer Paternal Grandmother   . Cancer Paternal Grandmother        brain  . Hypertension Mother   . Migraines Mother   . Learning disabilities Sister   . Cancer Maternal Grandmother        lung (small cell) >liver>pancreas smoker   . Migraines Maternal Grandmother   . Cancer Paternal Grandfather        skin cancer   . Breast cancer Neg Hx   . Colon cancer Neg Hx   . Heart disease Neg Hx     Social History Social History   Tobacco Use  . Smoking status: Never Smoker  . Smokeless tobacco: Never Used  Vaping Use  . Vaping Use: Never used  Substance Use Topics  . Alcohol use: No    Alcohol/week: 0.0 standard drinks  . Drug use: No    Review of Systems  Constitutional: Negative for fever. Cardiovascular: Negative for chest pain. Respiratory: Negative for shortness of breath. Gastrointestinal: Negative for abdominal pain, vomiting and diarrhea. Genitourinary: Negative for dysuria.  Vaginal bleeding as above. Musculoskeletal: Negative for back pain. Skin: Negative for rash. Neurological: Negative for headaches, focal weakness or numbness. ____________________________________________  PHYSICAL EXAM:  VITAL SIGNS: ED Triage Vitals  Enc Vitals Group     BP 09/12/20 1853 105/65     Pulse Rate 09/12/20 1853 88     Resp 09/12/20 1853 16     Temp 09/12/20 1853 98.1 F (36.7 C)     Temp src --      SpO2 09/12/20 1853 100 %     Weight 09/12/20 1839 165 lb (74.8 kg)     Height 09/12/20 1839 5\' 5"  (1.651 m)      Head Circumference --      Peak Flow --      Pain Score 09/12/20 1839 7     Pain Loc --      Pain Edu? --      Excl. in [place]? --     Constitutional: Alert and oriented. Well appearing and in no distress. Head: Normocephalic and atraumatic. Eyes: Conjunctivae are normal. Normal extraocular movements Cardiovascular: Normal rate, regular rhythm. Normal distal pulses. Respiratory: Normal respiratory effort. No wheezes/rales/rhonchi. Gastrointestinal: Soft and nontender. No distention. GU: Normal external genitalia.  No active bleeding in the vaginal vault.  Scant yellowish discharge is appreciated.  No cervical motion tenderness noted.  Cervical os is closed. Musculoskeletal: Nontender with normal range of motion in all extremities.  Neurologic:  Normal gait without ataxia. Normal speech and language. No gross focal neurologic deficits are appreciated.  Skin:  Skin is warm, dry and intact. No rash noted. Psychiatric: Mood and affect are normal. Patient exhibits appropriate insight and judgment. ____________________________________________   LABS (pertinent positives/negatives) Labs Reviewed  URINALYSIS, COMPLETE (UACMP) WITH MICROSCOPIC - Abnormal; Notable for the following components:      Result Value   Color, Urine STRAW (*)    APPearance CLEAR (*)    pH 9.0 (*)    All other components within normal limits  COMPREHENSIVE METABOLIC PANEL - Abnormal; Notable for the following components:   AST 43 (*)    All other components within normal limits  HCG, QUANTITATIVE, PREGNANCY - Abnormal; Notable for the following components:   hCG, Beta Chain, Quant, S 23,116 (*)    All other components within normal limits  WET PREP, GENITAL  CBC WITH DIFFERENTIAL/PLATELET  ABO/RH  ____________________________________________   RADIOLOGY  OB US >14 WKS w/  Transvaginal   ____________________________________________  PROCEDURES   Procedures ____________________________________________  INITIAL IMPRESSION / ASSESSMENT AND PLAN / ED COURSE  Differential diagnosis includes, but is not limited to, ovarian cyst, ovarian torsion, acute appendicitis, diverticulitis, urinary tract infection/pyelonephritis, endometriosis, bowel obstruction, colitis, renal colic, gastroenteritis, hernia, fibroids, endometriosis, pregnancy related pain including ectopic pregnancy, etc.  Patient ED evaluation of pinkish mucoid discharge noted this morning.  She is also had some intermittent vaginal cramping.  She was evaluated for her symptoms in the ED with labs that are reassuring including a beta quant that is 23,000+.  Her UA does not reveal any acute cystitis.  Ultrasound is pending at the time of this disposition.  From disposition will be transferred to my colleague Evelina Bucy, PA-C.  She will manage the patient based on ultrasound results.    Ruth Tran was evaluated in Emergency Department on 09/12/2020 for the symptoms described in the history of present illness. She was evaluated in the context of the global COVID-19 pandemic, which necessitated consideration that the patient might be at risk for infection with the SARS-CoV-2 virus that causes COVID-19. Institutional protocols and algorithms that pertain to the evaluation of patients at risk for COVID-19 are in a state of rapid change based on information released by regulatory bodies including the CDC and federal and state organizations. These policies and algorithms were followed during the patient's care in the ED. ____________________________________________  FINAL CLINICAL IMPRESSION(S) / ED DIAGNOSES  Final diagnoses:  Threatened miscarriage  Vaginal bleeding  Pelvic pain      Dabria Wadas, Dannielle Karvonen, PA-C 09/12/20 2138    Nance Pear, MD 09/12/20 2227

## 2020-09-12 NOTE — ED Triage Notes (Addendum)
C/o abdominal cramping that started today and having pink/brown discharge with nausea. Reports she is 6-7 weeks. First pregnancy

## 2020-09-12 NOTE — Discharge Instructions (Signed)
Please call your intended OB provider tomorrow morning to discuss your ER visit and to determine if they would like to investigate with further lab work in 48 hours.  For your nausea, you may try a half a tablet of Unisom with a B6 vitamin and you can take this combination twice daily.  Please return to the emergency department if you experience any worsening abdominal or pelvic pain, a lot of vaginal bleeding or other significant changes.

## 2020-09-30 DIAGNOSIS — Z3490 Encounter for supervision of normal pregnancy, unspecified, unspecified trimester: Secondary | ICD-10-CM | POA: Insufficient documentation

## 2020-11-04 LAB — OB RESULTS CONSOLE HEPATITIS B SURFACE ANTIGEN: Hepatitis B Surface Ag: NEGATIVE

## 2020-11-04 LAB — OB RESULTS CONSOLE VARICELLA ZOSTER ANTIBODY, IGG: Varicella: IMMUNE

## 2020-11-04 LAB — OB RESULTS CONSOLE RUBELLA ANTIBODY, IGM: Rubella: IMMUNE

## 2021-01-06 ENCOUNTER — Encounter: Payer: Self-pay | Admitting: Internal Medicine

## 2021-01-15 ENCOUNTER — Other Ambulatory Visit: Payer: Self-pay

## 2021-01-15 ENCOUNTER — Observation Stay
Admission: EM | Admit: 2021-01-15 | Discharge: 2021-01-15 | Disposition: A | Discharge status: Home / Self Care | Payer: BC Managed Care – PPO | Attending: Obstetrics and Gynecology | Admitting: Obstetrics and Gynecology

## 2021-01-15 ENCOUNTER — Encounter: Payer: Self-pay | Admitting: Obstetrics and Gynecology

## 2021-01-15 DIAGNOSIS — Z91040 Latex allergy status: Secondary | ICD-10-CM | POA: Diagnosis not present

## 2021-01-15 DIAGNOSIS — R1032 Left lower quadrant pain: Secondary | ICD-10-CM | POA: Insufficient documentation

## 2021-01-15 DIAGNOSIS — Z3A23 23 weeks gestation of pregnancy: Secondary | ICD-10-CM | POA: Insufficient documentation

## 2021-01-15 DIAGNOSIS — M545 Low back pain, unspecified: Secondary | ICD-10-CM | POA: Diagnosis not present

## 2021-01-15 DIAGNOSIS — O26892 Other specified pregnancy related conditions, second trimester: Principal | ICD-10-CM | POA: Insufficient documentation

## 2021-01-15 DIAGNOSIS — R110 Nausea: Secondary | ICD-10-CM | POA: Insufficient documentation

## 2021-01-15 DIAGNOSIS — Z3490 Encounter for supervision of normal pregnancy, unspecified, unspecified trimester: Secondary | ICD-10-CM

## 2021-01-15 LAB — WET PREP, GENITAL
Clue Cells Wet Prep HPF POC: NONE SEEN
Sperm: NONE SEEN
Trich, Wet Prep: NONE SEEN
Yeast Wet Prep HPF POC: NONE SEEN

## 2021-01-15 LAB — URINALYSIS, ROUTINE W REFLEX MICROSCOPIC
Bilirubin Urine: NEGATIVE
Glucose, UA: NEGATIVE mg/dL
Hgb urine dipstick: NEGATIVE
Ketones, ur: NEGATIVE mg/dL
Nitrite: NEGATIVE
Protein, ur: NEGATIVE mg/dL
Specific Gravity, Urine: 1.02 (ref 1.005–1.030)
pH: 7 (ref 5.0–8.0)

## 2021-01-15 LAB — FETAL FIBRONECTIN: Fetal Fibronectin: NEGATIVE

## 2021-01-15 NOTE — Progress Notes (Signed)
Pt is a G1P0 and 75w6dand presents to L&D with c/o lower abdominal pain that is constant and sharp and worse in the lower left quadrant of her abdomen. Pt states she also feels pressure in her back and has been nauseas with no vomiting. Pt reports pain as 6/10 on a 0-10 pain scale. Pt states this started around 4pm today and she took 1,'000mg'$  of Tylenol at 1500 for a headache. Pt states she has not felt baby move in 2.5 hours. Initial FHT via doppler 150 with audible movement. Toco applied with no contractions noted thus far.

## 2021-01-15 NOTE — OB Triage Note (Signed)
Discharge instructions reviewed and pt verbalized understanding. Pt has appt scheduled for Wednesday. Red flag labor symptoms reviewed and pt education complete on pain management. All questions answered and pt stable at time of discharge with significant other.

## 2021-01-15 NOTE — Discharge Instructions (Addendum)
Please add magnesium '800mg'$  po daily, melatonin 3 mg daily, and riboflavin '400mg'$  daily to discharge all OTC medications, second line treatment after tylenol has been tried per [facility] CNM

## 2021-01-15 NOTE — Progress Notes (Signed)
Pt states she does not have pain in her lower abdomen at all anymore, but the discomfort continues in her back. Pt states she has had back pain for 3 weeks but she "thought it was normal." CNM made aware.

## 2021-01-15 NOTE — OB Triage Note (Signed)
Ruth Tran is a 30 y.o. female. She is at 20w6dgestation. Patient's last menstrual period was 08/01/2020 (exact date). Estimated Date of Delivery: 05/08/21  [facility]  Chief complaint: Abdominal and lower back pain with nausea  HPI: CSaylapresents to L&D with complaints of with c/o lower abdominal pain that is constant and sharp and worse in the lower left quadrant of her abdomen. Pt states she also feels pressure in her back and has been nauseous with no vomiting. Denies having sexual intercourse in the last 3 days.  Factors complicating pregnancy: Hx Thyroid disease(graves disease) Low platelets Migraines without aura Hx of anxiety  S: Resting comfortably. no CTX, no VB.no LOF,  Active fetal movement.   Maternal Medical History:  Past Medical Hx:  has a past medical history of Allergy, Chicken pox, Complication of anesthesia, Complication of anesthesia, Dysmenorrhea, Family history of endometriosis, Graves disease, Headache, Migraine, Ovarian cyst, Panic attack, and UTI (urinary tract infection).    Past Surgical Hx:  has a past surgical history that includes Foot surgery (Bilateral, 2008); Wisdom tooth extraction; Tonsillectomy and adenoidectomy (N/A, 04/17/2017); Breast biopsy (Right, 2017); Appendectomy (2005); Laparoscopic ovarian cystectomy (Right, 08/12/2018); Lysis of adhesion (Right, 08/12/2018); and Chromopertubation (Right, 08/12/2018).   Allergies  Allergen Reactions   Lidocaine Anaphylaxis   Lidocaine-Epinephrine Anaphylaxis   Toradol [Ketorolac Tromethamine] Anaphylaxis and Rash    Throat swelling and feeling of heat over whole body.   Tramadol Anaphylaxis   Latex Rash   Linzess [Linaclotide]     Ab cramps    Tape Rash     Prior to Admission medications   Medication Sig Start Date End Date Taking? Authorizing Provider  montelukast (SINGULAIR) 10 MG tablet Take 1 tablet (10 mg total) by mouth at bedtime. 03/11/20  Yes  McLean-Scocuzza, TNino Glow MD  Prenatal Vit-Fe Fumarate-FA (PRENATAL MULTIVITAMIN) TABS tablet Take 1 tablet by mouth daily at 12 noon.   Yes [provider]  cetirizine (ZYRTEC) 10 MG tablet Take 1 tablet (10 mg total) by mouth at bedtime. Patient not taking: Reported on 01/15/2021 09/12/19   McLean-Scocuzza, TNino Glow MD  fluticasone ([facility] MCG/ACT nasal spray Place 2 sprays into both nostrils daily. Patient not taking: Reported on 01/15/2021 06/07/20   HEvelina DunA, FNP  hydrocortisone 2.5 % cream Apply topically 2 (two) times daily. Arms as needed Patient not taking: Reported on 01/15/2021 09/12/19   McLean-Scocuzza, TNino Glow MD  hydrOXYzine (ATARAX/VISTARIL) 25 MG tablet Take 1 tablet (25 mg total) by mouth 2 (two) times daily as needed. Patient not taking: Reported on 01/15/2021 09/12/19   McLean-Scocuzza, TNino Glow MD  norgestimate-ethinyl estradiol (ORTHO-CYCLEN,SPRINTEC,PREVIFEM) 0.25-35 MG-MCG tablet Take 1 tablet by mouth daily. Patient not taking: Reported on 01/15/2021 04/22/18   [provider]  SUMAtriptan (IMITREX) 100 MG tablet Take 1 tablet (100 mg total) by mouth every 2 (two) hours as needed for migraine. May repeat in 2 hours if headache persists or recurs. Patient not taking: Reported on 01/15/2021 03/01/20   LDebbora Presto NP    Social History: She  reports that she has never smoked. She has never used smokeless tobacco. She reports that she does not drink alcohol and does not use drugs.  Family History: family history includes Cancer in her maternal grandfather, maternal grandmother, paternal grandfather, and paternal grandmother; Diabetes in her father; GBerenice Primas disease in her maternal grandfather and another family member; Hyperlipidemia in her father; Hypertension in her mother; Learning disabilities in her sister;  Migraines in her maternal grandmother and mother; Miscarriages / Stillbirths in her maternal grandfather; Ovarian cancer in her paternal grandmother.    Review of Systems: A full review of systems was performed and negative except as noted in the HPI.    O:  BP 119/62 (BP Location: Right Arm)   Pulse 90   Temp 98 F (36.7 C) (Oral)   Resp 16   Ht '5\' 5"'$  (1.651 m)   Wt 83.9 kg   LMP 08/01/2020 (Exact Date)   BMI 30.79 kg/m  Results for orders placed or performed during the hospital encounter of 01/15/21 (from the past 48 hour(s))  Wet prep, genital   Collection Time: 01/15/21  9:11 PM   Specimen: Vaginal  Result Value Ref Range   Yeast Wet Prep HPF POC NONE SEEN NONE SEEN   Trich, Wet Prep NONE SEEN NONE SEEN   Clue Cells Wet Prep HPF POC NONE SEEN NONE SEEN   WBC, Wet Prep HPF POC MANY (A) NONE SEEN   Sperm NONE SEEN      Constitutional: NAD, AAOx3  HE/ENT: extraocular movements grossly intact, moist mucous membranes CV: RRR PULM: nl respiratory effort, CTABL Abd: gravid, non-tender, non-distended, soft  Ext: Non-tender, Nonedmeatous Psych: mood appropriate, speech normal Pelvic : deferred SVE:   deferred  Fetal Monitor: Doppler 140-150's  Toco: quiet   Assessment: 30 y.o. 74w6dhere for antenatal surveillance during pregnancy.  Principle diagnosis: abdominal pain, Nausea during pregnancy There were no encounter diagnoses.   Plan: Labor: not present.  Fetal Wellbeing: doppler 140's-150's UA, FFN, Wet prep ordered Reinforce po hydration Pt to start magnesium/riboflavin/melatonin for headache relief Send patient home self care, all labs neg. Positive fetal movement, no VB or LOF    ----- FAvelino LeedsCNM Certified Nurse Midwife [facility]

## 2021-01-15 NOTE — Discharge Summary (Signed)
Avelino Leeds Arlyn Leak, CNM  Certified Nurse Midwife  Obstetrics  OB Triage Note     Signed  Date of Service:  01/15/2021  9:32 PM           Signed                                                                                                                                                                                                                                                                                     Ruth Tran is a 30 y.o. female. She is at 53w6dgestation. Patient's last menstrual period was 08/01/2020 (exact date). Estimated Date of Delivery: 05/08/21   [facility]   Chief complaint: Abdominal and lower back pain with nausea   HPI: CJaylepresents to L&D with complaints of with c/o lower abdominal pain that is constant and sharp and worse in the lower left quadrant of her abdomen. Pt states she also feels pressure in her back and has been nauseous with no vomiting. Denies having sexual intercourse in the last 3 days.   Factors complicating pregnancy: Hx Thyroid disease(graves disease) Low platelets Migraines without aura Hx of anxiety   S: Resting comfortably. no CTX, no VB.no LOF,  Active fetal movement.    Maternal Medical History:  Past Medical Hx:  has a past medical history of Allergy, Chicken pox, Complication of anesthesia, Complication of anesthesia, Dysmenorrhea, Family history of endometriosis, Graves disease, Headache, Migraine, Ovarian cyst, Panic attack, and UTI (urinary tract infection).     Past Surgical Hx:  has a past surgical history that includes Foot surgery (Bilateral, 2008); Wisdom tooth extraction; Tonsillectomy and adenoidectomy (N/A, 04/17/2017); Breast biopsy (Right, 2017); Appendectomy (2005); Laparoscopic ovarian cystectomy (Right, 08/12/2018); Lysis of adhesion (Right, 08/12/2018); and  Chromopertubation (Right, 08/12/2018).         Allergies  Allergen Reactions   Lidocaine Anaphylaxis   Lidocaine-Epinephrine Anaphylaxis   Toradol [Ketorolac Tromethamine] Anaphylaxis and Rash      Throat swelling and feeling of heat over whole body.   Tramadol Anaphylaxis   Latex Rash   Linzess [Linaclotide]  Ab cramps     Tape Rash             Prior to Admission medications   Medication Sig Start Date End Date Taking? Authorizing Provider  montelukast (SINGULAIR) 10 MG tablet Take 1 tablet (10 mg total) by mouth at bedtime. 03/11/20   Yes McLean-Scocuzza, [name], MD  Prenatal Vit-Fe Fumarate-FA (PRENATAL MULTIVITAMIN) TABS tablet Take 1 tablet by mouth daily at 12 noon.     Yes [provider]  cetirizine (ZYRTEC) 10 MG tablet Take 1 tablet (10 mg total) by mouth at bedtime. Patient not taking: Reported on 01/15/2021 09/12/19     McLean-Scocuzza, [name], MD  fluticasone [facility]) 50 MCG/ACT nasal spray Place 2 sprays into both nostrils daily. Patient not taking: Reported on 01/15/2021 06/07/20     Evelina Dun A, FNP  hydrocortisone 2.5 % cream Apply topically 2 (two) times daily. Arms as needed Patient not taking: Reported on 01/15/2021 09/12/19     McLean-Scocuzza, [name], MD  hydrOXYzine (ATARAX/VISTARIL) 25 MG tablet Take 1 tablet (25 mg total) by mouth 2 (two) times daily as needed. Patient not taking: Reported on 01/15/2021 09/12/19     McLean-Scocuzza, [name], MD  norgestimate-ethinyl estradiol (ORTHO-CYCLEN,SPRINTEC,PREVIFEM) 0.25-35 MG-MCG tablet Take 1 tablet by mouth daily. Patient not taking: Reported on 01/15/2021 04/22/18     [provider]  SUMAtriptan (IMITREX) 100 MG tablet Take 1 tablet (100 mg total) by mouth every 2 (two) hours as needed for migraine. May repeat in 2 hours if headache persists or recurs. Patient not taking: Reported on 01/15/2021 03/01/20     Debbora Presto, NP      Social History: She  reports that she has never smoked. She  has never used smokeless tobacco. She reports that she does not drink alcohol and does not use drugs.   Family History: family history includes Cancer in her maternal grandfather, maternal grandmother, paternal grandfather, and paternal grandmother; Diabetes in her father; Berenice Primas' disease in her maternal grandfather and another family member; Hyperlipidemia in her father; Hypertension in her mother; Learning disabilities in her sister; Migraines in her maternal grandmother and mother; Miscarriages / Stillbirths in her maternal grandfather; Ovarian cancer in her paternal grandmother.    Review of Systems: A full review of systems was performed and negative except as noted in the HPI.     O:  BP 119/62 (BP Location: Right Arm)   Pulse 90   Temp 98 F (36.7 C) (Oral)   Resp 16   Ht '5\' 5"'$  (1.651 m)   Wt 83.9 kg   LMP 08/01/2020 (Exact Date)   BMI 30.79 kg/m       Results for orders placed or performed during the hospital encounter of 01/15/21 (from the past 48 hour(s))  Wet prep, genital    Collection Time: 01/15/21  9:11 PM    Specimen: Vaginal  Result Value Ref Range    Yeast Wet Prep HPF POC NONE SEEN NONE SEEN    Trich, Wet Prep NONE SEEN NONE SEEN    Clue Cells Wet Prep HPF POC NONE SEEN NONE SEEN    WBC, Wet Prep HPF POC MANY (A) NONE SEEN    Sperm NONE SEEN      Constitutional: NAD, AAOx3  HE/ENT: extraocular movements grossly intact, moist mucous membranes CV: RRR PULM: nl respiratory effort, CTABL Abd: gravid, non-tender, non-distended, soft  Ext: Non-tender, Nonedmeatous Psych: mood appropriate, speech normal Pelvic : deferred SVE:   deferred  Fetal Monitor: Doppler 140-150's   Toco: quiet     Assessment: 30 y.o. 57w6dhere for antenatal surveillance during pregnancy.   Principle diagnosis: abdominal pain, Nausea during pregnancy There were no encounter diagnoses.    Plan: Labor: not present.  Fetal Wellbeing: doppler 140's-150's UA, FFN, Wet prep  ordered Reinforce po hydration Pt to start magnesium/riboflavin/melatonin for headache relief Send patient home self care, all labs neg. Positive fetal movement, no VB or LOF       ----- FAvelino LeedsCNM Certified Nurse Midwife [facility]           Note Details  Author DEd Blalock CNorth DakotaFile Time 01/15/2021 11:22 PM  Author Type Certified Nurse Midwife Status Signed  Last Editor DEd Blalock [place]# [id number] Date 01/15/2021

## 2021-03-11 ENCOUNTER — Encounter: Payer: BC Managed Care – PPO | Admitting: Internal Medicine

## 2021-03-20 ENCOUNTER — Encounter: Payer: Self-pay | Admitting: Internal Medicine

## 2021-03-22 ENCOUNTER — Observation Stay
Admission: EM | Admit: 2021-03-22 | Discharge: 2021-03-22 | Disposition: A | Discharge status: Home / Self Care | Payer: BC Managed Care – PPO | Attending: Certified Nurse Midwife | Admitting: Certified Nurse Midwife

## 2021-03-22 ENCOUNTER — Other Ambulatory Visit: Payer: Self-pay

## 2021-03-22 ENCOUNTER — Encounter: Payer: Self-pay | Admitting: Obstetrics and Gynecology

## 2021-03-22 DIAGNOSIS — O0993 Supervision of high risk pregnancy, unspecified, third trimester: Secondary | ICD-10-CM | POA: Diagnosis not present

## 2021-03-22 DIAGNOSIS — R109 Unspecified abdominal pain: Secondary | ICD-10-CM | POA: Diagnosis not present

## 2021-03-22 DIAGNOSIS — Z91040 Latex allergy status: Secondary | ICD-10-CM | POA: Diagnosis not present

## 2021-03-22 DIAGNOSIS — O26893 Other specified pregnancy related conditions, third trimester: Secondary | ICD-10-CM | POA: Diagnosis present

## 2021-03-22 DIAGNOSIS — Z3A33 33 weeks gestation of pregnancy: Secondary | ICD-10-CM | POA: Diagnosis not present

## 2021-03-22 DIAGNOSIS — N898 Other specified noninflammatory disorders of vagina: Secondary | ICD-10-CM | POA: Diagnosis not present

## 2021-03-22 LAB — WET PREP, GENITAL
Sperm: NONE SEEN
Trich, Wet Prep: NONE SEEN
Yeast Wet Prep HPF POC: NONE SEEN

## 2021-03-22 LAB — RUPTURE OF MEMBRANE (ROM)PLUS: Rom Plus: NEGATIVE

## 2021-03-22 MED ORDER — METRONIDAZOLE 500 MG PO TABS: 500.0000 mg | ORAL_TABLET | Freq: Two times a day (BID) | ORAL | 0 refills | Status: AC

## 2021-03-22 MED ORDER — DOCUSATE SODIUM 100 MG PO CAPS: 100.0000 mg | ORAL_CAPSULE | Freq: Every day | ORAL | Status: DC

## 2021-03-22 MED ORDER — PRENATAL MULTIVITAMIN CH: 1.0000 | ORAL_TABLET | Freq: Every day | ORAL | Status: DC

## 2021-03-22 MED ORDER — CALCIUM CARBONATE ANTACID 500 MG PO CHEW: 2.0000 | CHEWABLE_TABLET | ORAL | Status: DC | PRN

## 2021-03-22 MED ORDER — ACETAMINOPHEN 325 MG PO TABS: 650.0000 mg | ORAL_TABLET | ORAL | Status: DC | PRN

## 2021-03-22 MED ORDER — METRONIDAZOLE 500 MG PO TABS
500.0000 mg | ORAL_TABLET | Freq: Two times a day (BID) | ORAL | Status: DC
  Administered 2021-03-22: 500 mg via ORAL
  Filled 2021-03-22: qty 1

## 2021-03-22 NOTE — Progress Notes (Signed)
Discharge instructions provided to pt. Pt verbalizes understanding. Vaginal bleeding and discharge, contractions, and fetal movement reviewed by RN. Pt discharged home with significant other.

## 2021-03-22 NOTE — Telephone Encounter (Signed)
Patient scheduled to be seen 03/29/21, next Tuesday. Should appointment be moved up sooner with another provider?   Thyroid labs done and in Patient chart from 03/15/21.

## 2021-03-22 NOTE — OB Triage Note (Signed)
Pt. presents to Labor & Delivery today due to complaints of lower abdominal cramping & pressure and leaking of vaginal fluid/discharge since 1400 this afternoon. Pt. states the fluid is clear and watery, with no smell and is small in its amount, but continuous. She rates her pain a 2-3/10 and says it has been constant since this afternoon. External [place] and Toco applied. Initial FHR 140bpm. Vital signs WNL. Will continue to assess.

## 2021-03-22 NOTE — OB Triage Note (Addendum)
Ruth Tran is a 30 y.o. female. She is at [redacted]w[redacted]d gestation. Patient's last menstrual period was 08/01/2020 (exact date). Estimated Date of Delivery: 05/08/21  Prenatal care site: [facility]  Current pregnancy complicated by:  - thyroid disease (she is not taking her Synthroid) - chronic migraines - anxiety - low platelets  Chief complaint: leaking fluid, abdominal cramping  S: Resting comfortably. no CTX, no VB.  Active fetal movement. Reports leaking yellowish fluid this afternoon, not enough to soak a pad. Abdominal cramping this afternoon, unable to time contractions. Denies: HA, visual changes, SOB, or RUQ/epigastric pain  Maternal Medical History:   Past Medical History:  Diagnosis Date   Allergy    Chicken pox    Complication of anesthesia    WITH HER WISDOM TEETH SURGERY SHE HAD TO GO TO THE OR TO HAVE THIS DONE DUE TO TACHYCARDIA DUE TO GRAVES DISEASE   Complication of anesthesia    Dysmenorrhea    Family history of endometriosis    mother,sister   Graves disease    graves, hyperthyroidism    Headache    MIGRAINES   Migraine    Ovarian cyst    Panic attack    UTI (urinary tract infection)     Past Surgical History:  Procedure Laterality Date   APPENDECTOMY  2005   WITH DRAIN PLACED DUE TO ABSCESS 2005 [facility]   BREAST BIOPSY Right 2017   had a reaction to xylo or xylo with epi where her throat closed up   CHROMOPERTUBATION Right 08/12/2018   Procedure: CHROMOPERTUBATION;  Surgeon: Benjaman Kindler, MD;  Location: [facility] ORS;  Service: Gynecology;  Laterality: Right;   FOOT SURGERY Bilateral 2008   toes going outward surgery to turn toes inward 2010    LAPAROSCOPIC OVARIAN CYSTECTOMY Right 08/12/2018   Procedure: LAPAROSCOPIC OVARIAN CYSTECTOMY;  Surgeon: Benjaman Kindler, MD;  Location: [facility] ORS;  Service: Gynecology;  Laterality: Right;   LYSIS OF ADHESION Right 08/12/2018   Procedure: LYSIS OF ADHESIONS Lysis of Omental Adhesions, right cecum  adhesions, right fallopian tube and right adnexal adhesions;  Surgeon: Benjaman Kindler, MD;  Location: [facility] ORS;  Service: Gynecology;  Laterality: Right;   TONSILLECTOMY AND ADENOIDECTOMY N/A 04/17/2017   Procedure: TONSILLECTOMY AND POSSIBLE  ADENOIDECTOMY;  Surgeon: Beverly Gust, MD;  Location: [facility] ORS;  Service: ENT;  Laterality: N/A;   WISDOM TOOTH EXTRACTION     2016    Allergies  Allergen Reactions   Lidocaine Anaphylaxis   Lidocaine-Epinephrine Anaphylaxis   Toradol [Ketorolac Tromethamine] Anaphylaxis and Rash    Throat swelling and feeling of heat over whole body.   Tramadol Anaphylaxis   Latex Rash   Linzess [Linaclotide]     Ab cramps    Tape Rash    Prior to Admission medications   Medication Sig Start Date End Date Taking? Authorizing Provider  montelukast (SINGULAIR) 10 MG tablet Take 10 mg by mouth at bedtime.   Yes [provider]  Prenatal Vit-Fe Fumarate-FA (PRENATAL MULTIVITAMIN) TABS tablet Take 1 tablet by mouth daily at 12 noon.   Yes [provider]  metroNIDAZOLE (FLAGYL) 500 MG tablet Take 1 tablet (500 mg total) by mouth every 12 (twelve) hours for 7 days. 03/22/21 03/29/21  Gertie Fey, CNM      Social History: She  reports that she has never smoked. She has never used smokeless tobacco. She reports that she does not drink alcohol and does not use drugs.  Family History: family history includes Cancer  in her maternal grandfather, maternal grandmother, paternal grandfather, and paternal grandmother; Diabetes in her father; Berenice Primas' disease in her maternal grandfather and another family member; Hyperlipidemia in her father; Hypertension in her mother; Learning disabilities in her sister; Migraines in her maternal grandmother and mother; Miscarriages / Stillbirths in her maternal grandfather; Ovarian cancer in her paternal grandmother.  no history of gyn cancers  Review of Systems: A full review of systems was performed and  negative except as noted in the HPI.     O:  BP (!) 104/53 (BP Location: Right Arm)   Pulse 84   Temp 97.9 F (36.6 C) (Oral)   Resp 18   LMP 08/01/2020 (Exact Date)  Results for orders placed or performed during the hospital encounter of 03/22/21 (from the past 48 hour(s))  Wet prep, genital   Collection Time: 03/22/21  6:03 PM  Result Value Ref Range   Yeast Wet Prep HPF POC NONE SEEN NONE SEEN   Trich, Wet Prep NONE SEEN NONE SEEN   Clue Cells Wet Prep HPF POC PRESENT (A) NONE SEEN   WBC, Wet Prep HPF POC PRESENT (A) NONE SEEN   Sperm NONE SEEN   ROM Plus ([facility] only)   Collection Time: 03/22/21  6:03 PM  Result Value Ref Range   Rom Plus NEGATIVE       Constitutional: NAD, AAOx3  HE/ENT: extraocular movements grossly intact, moist mucous membranes CV: RRR PULM: nl respiratory effort, CTABL     Abd: gravid, non-tender, non-distended, soft      Ext: Non-tender, Nonedematous   Psych: mood appropriate, speech normal Pelvic: SSE done, fingertip/thick/-3  Pelvic exam: normal external genitalia, vulva, vagina, cervix, uterus and adnexa.  Fetal  monitoring: Cat 1 Appropriate for GA Baseline: 130bpm Variability: moderate Accelerations: present x >2 Decelerations absent Contractions: absent Time 22mins  A/P: 30 y.o. [redacted]w[redacted]d here for antenatal surveillance for vaginal discharge, cramping  Principle Diagnosis:  High risk pregnancy in third trimester  Labor: not present.  Fetal Wellbeing: Reassuring Cat 1 tracing. Reactive NST  BV present on wet prep. Rx for Metronidazole 500mg  PO BID x7 days Negative fern, negative ROM plus D/c home stable, precautions reviewed, follow-up as scheduled.    Gertie Fey, CNM 03/22/2021 6:46 PM

## 2021-03-29 ENCOUNTER — Ambulatory Visit: Payer: Self-pay | Admitting: Internal Medicine

## 2021-04-02 NOTE — Discharge Summary (Signed)
Ruth Tran is a 30 y.o. female. She is at [redacted]w[redacted]d gestation. Patient's last menstrual period was 08/01/2020 (exact date). Estimated Date of Delivery: 05/08/21  Prenatal care site: [facility]  Current pregnancy complicated by:  - thyroid disease (she is not taking her Synthroid) - chronic migraines - anxiety - low platelets  Chief complaint: leaking fluid, abdominal cramping  S: Resting comfortably. no CTX, no VB.  Active fetal movement. Reports leaking yellowish fluid this afternoon, not enough to soak a pad. Abdominal cramping this afternoon, unable to time contractions. Denies: HA, visual changes, SOB, or RUQ/epigastric pain  Maternal Medical History:   Past Medical History:  Diagnosis Date   Allergy    Chicken pox    Complication of anesthesia    WITH HER WISDOM TEETH SURGERY SHE HAD TO GO TO THE OR TO HAVE THIS DONE DUE TO TACHYCARDIA DUE TO GRAVES DISEASE   Complication of anesthesia    Dysmenorrhea    Family history of endometriosis    mother,sister   Graves disease    graves, hyperthyroidism    Headache    MIGRAINES   Migraine    Ovarian cyst    Panic attack    UTI (urinary tract infection)     Past Surgical History:  Procedure Laterality Date   APPENDECTOMY  2005   WITH DRAIN PLACED DUE TO ABSCESS 2005 [facility]   BREAST BIOPSY Right 2017   had a reaction to xylo or xylo with epi where her throat closed up   CHROMOPERTUBATION Right 08/12/2018   Procedure: CHROMOPERTUBATION;  Surgeon: Benjaman Kindler, MD;  Location: [facility] ORS;  Service: Gynecology;  Laterality: Right;   FOOT SURGERY Bilateral 2008   toes going outward surgery to turn toes inward 2010    LAPAROSCOPIC OVARIAN CYSTECTOMY Right 08/12/2018   Procedure: LAPAROSCOPIC OVARIAN CYSTECTOMY;  Surgeon: Benjaman Kindler, MD;  Location: [facility] ORS;  Service: Gynecology;  Laterality: Right;   LYSIS OF ADHESION Right 08/12/2018   Procedure: LYSIS OF ADHESIONS Lysis of Omental Adhesions, right cecum  adhesions, right fallopian tube and right adnexal adhesions;  Surgeon: Benjaman Kindler, MD;  Location: [facility] ORS;  Service: Gynecology;  Laterality: Right;   TONSILLECTOMY AND ADENOIDECTOMY N/A 04/17/2017   Procedure: TONSILLECTOMY AND POSSIBLE  ADENOIDECTOMY;  Surgeon: Beverly Gust, MD;  Location: [facility] ORS;  Service: ENT;  Laterality: N/A;   WISDOM TOOTH EXTRACTION     2016    Allergies  Allergen Reactions   Lidocaine Anaphylaxis   Lidocaine-Epinephrine Anaphylaxis   Toradol [Ketorolac Tromethamine] Anaphylaxis and Rash    Throat swelling and feeling of heat over whole body.   Tramadol Anaphylaxis   Latex Rash   Linzess [Linaclotide]     Ab cramps    Tape Rash    Prior to Admission medications   Medication Sig Start Date End Date Taking? Authorizing Provider  montelukast (SINGULAIR) 10 MG tablet Take 10 mg by mouth at bedtime.   Yes [provider]  Prenatal Vit-Fe Fumarate-FA (PRENATAL MULTIVITAMIN) TABS tablet Take 1 tablet by mouth daily at 12 noon.   Yes [provider]  metroNIDAZOLE (FLAGYL) 500 MG tablet Take 1 tablet (500 mg total) by mouth every 12 (twelve) hours for 7 days. 03/22/21 03/29/21  Gertie Fey, CNM      Social History: She  reports that she has never smoked. She has never used smokeless tobacco. She reports that she does not drink alcohol and does not use drugs.  Family History: family history includes Cancer  in her maternal grandfather, maternal grandmother, paternal grandfather, and paternal grandmother; Diabetes in her father; Berenice Primas' disease in her maternal grandfather and another family member; Hyperlipidemia in her father; Hypertension in her mother; Learning disabilities in her sister; Migraines in her maternal grandmother and mother; Miscarriages / Stillbirths in her maternal grandfather; Ovarian cancer in her paternal grandmother.  no history of gyn cancers  Review of Systems: A full review of systems was performed and  negative except as noted in the HPI.     O:  BP (!) 104/53 (BP Location: Right Arm)   Pulse 84   Temp 97.9 F (36.6 C) (Oral)   Resp 18   LMP 08/01/2020 (Exact Date)  Results for orders placed or performed during the hospital encounter of 03/22/21 (from the past 48 hour(s))  Wet prep, genital   Collection Time: 03/22/21  6:03 PM  Result Value Ref Range   Yeast Wet Prep HPF POC NONE SEEN NONE SEEN   Trich, Wet Prep NONE SEEN NONE SEEN   Clue Cells Wet Prep HPF POC PRESENT (A) NONE SEEN   WBC, Wet Prep HPF POC PRESENT (A) NONE SEEN   Sperm NONE SEEN   ROM Plus ([facility] only)   Collection Time: 03/22/21  6:03 PM  Result Value Ref Range   Rom Plus NEGATIVE       Constitutional: NAD, AAOx3  HE/ENT: extraocular movements grossly intact, moist mucous membranes CV: RRR PULM: nl respiratory effort, CTABL     Abd: gravid, non-tender, non-distended, soft      Ext: Non-tender, Nonedematous   Psych: mood appropriate, speech normal Pelvic: SSE done, fingertip/thick/-3  Pelvic exam: normal external genitalia, vulva, vagina, cervix, uterus and adnexa.  Fetal  monitoring: Cat 1 Appropriate for GA Baseline: 130bpm Variability: moderate Accelerations: present x >2 Decelerations absent Contractions: absent Time 34mins  A/P: 30 y.o. [redacted]w[redacted]d here for antenatal surveillance for vaginal discharge, cramping  Principle Diagnosis:  High risk pregnancy in third trimester  Labor: not present.  Fetal Wellbeing: Reassuring Cat 1 tracing. Reactive NST  BV present on wet prep. Rx for Metronidazole 500mg  PO BID x7 days Negative fern, negative ROM plus D/c home stable, precautions reviewed, follow-up as scheduled.    Gertie Fey, [place] 03/22/2021 1920

## 2021-04-13 LAB — OB RESULTS CONSOLE GC/CHLAMYDIA
Chlamydia: NEGATIVE
Gonorrhea: NEGATIVE

## 2021-04-13 LAB — OB RESULTS CONSOLE GBS: GBS: NEGATIVE

## 2021-04-13 LAB — OB RESULTS CONSOLE RPR
RPR: NONREACTIVE
RPR: NONREACTIVE

## 2021-04-13 LAB — OB RESULTS CONSOLE HIV ANTIBODY (ROUTINE TESTING): HIV: NONREACTIVE

## 2021-04-22 ENCOUNTER — Encounter: Payer: Self-pay | Admitting: Internal Medicine

## 2021-04-25 ENCOUNTER — Other Ambulatory Visit: Payer: Self-pay

## 2021-04-25 ENCOUNTER — Observation Stay
Admission: EM | Admit: 2021-04-25 | Discharge: 2021-04-25 | Disposition: A | Discharge status: Home / Self Care | Payer: BC Managed Care – PPO | Attending: Obstetrics and Gynecology | Admitting: Obstetrics and Gynecology

## 2021-04-25 DIAGNOSIS — O368390 Maternal care for abnormalities of the fetal heart rate or rhythm, unspecified trimester, not applicable or unspecified: Secondary | ICD-10-CM | POA: Diagnosis present

## 2021-04-25 DIAGNOSIS — O368330 Maternal care for abnormalities of the fetal heart rate or rhythm, third trimester, not applicable or unspecified: Secondary | ICD-10-CM | POA: Diagnosis not present

## 2021-04-25 DIAGNOSIS — R519 Headache, unspecified: Secondary | ICD-10-CM | POA: Insufficient documentation

## 2021-04-25 DIAGNOSIS — Z91040 Latex allergy status: Secondary | ICD-10-CM | POA: Diagnosis not present

## 2021-04-25 DIAGNOSIS — E039 Hypothyroidism, unspecified: Secondary | ICD-10-CM | POA: Insufficient documentation

## 2021-04-25 DIAGNOSIS — R1011 Right upper quadrant pain: Secondary | ICD-10-CM | POA: Diagnosis not present

## 2021-04-25 DIAGNOSIS — O26893 Other specified pregnancy related conditions, third trimester: Principal | ICD-10-CM | POA: Insufficient documentation

## 2021-04-25 DIAGNOSIS — Z3A38 38 weeks gestation of pregnancy: Secondary | ICD-10-CM | POA: Diagnosis not present

## 2021-04-25 LAB — COMPREHENSIVE METABOLIC PANEL
ALT: 9 U/L (ref 0–44)
AST: 20 U/L (ref 15–41)
Albumin: 2.7 g/dL — ABNORMAL LOW (ref 3.5–5.0)
Alkaline Phosphatase: 104 U/L (ref 38–126)
Anion gap: 4 — ABNORMAL LOW (ref 5–15)
BUN: 9 mg/dL (ref 6–20)
CO2: 23 mmol/L (ref 22–32)
Calcium: 8.7 mg/dL — ABNORMAL LOW (ref 8.9–10.3)
Chloride: 108 mmol/L (ref 98–111)
Creatinine, Ser: 0.37 mg/dL — ABNORMAL LOW (ref 0.44–1.00)
GFR, Estimated: >60 mL/min (ref >60)
Glucose, Bld: 91 mg/dL (ref 70–99)
Potassium: 3.7 mmol/L (ref 3.5–5.1)
Sodium: 135 mmol/L (ref 135–145)
Total Bilirubin: 0.8 mg/dL (ref 0.3–1.2)
Total Protein: 5.9 g/dL — ABNORMAL LOW (ref 6.5–8.1)

## 2021-04-25 LAB — PROTEIN / CREATININE RATIO, URINE
Creatinine, Urine: 89 mg/dL
Protein Creatinine Ratio: 0.18 mg/mg{Cre} — ABNORMAL HIGH (ref 0.00–0.15)
Total Protein, Urine: 16 mg/dL

## 2021-04-25 LAB — CBC
HCT: 30.4 % — ABNORMAL LOW (ref 36.0–46.0)
Hemoglobin: 10 g/dL — ABNORMAL LOW (ref 12.0–15.0)
MCH: 27.6 pg (ref 26.0–34.0)
MCHC: 32.9 g/dL (ref 30.0–36.0)
MCV: 84 fL (ref 80.0–100.0)
Platelets: 156 10*3/uL (ref 150–400)
RBC: 3.62 MIL/uL — ABNORMAL LOW (ref 3.87–5.11)
RDW: 13 % (ref 11.5–15.5)
WBC: 8.3 10*3/uL (ref 4.0–10.5)
nRBC: 0 % (ref 0.0–0.2)

## 2021-04-25 NOTE — OB Triage Note (Signed)
Pt was sent to L&D for further evaluation. A doppler FHT was done in the office with questionable arrhythmia.

## 2021-04-25 NOTE — Discharge Summary (Signed)
Ruth Tran is a 30 y.o. female. She is at [redacted]w[redacted]d gestation. Patient's last menstrual period was 08/01/2020 (exact date). Estimated Date of Delivery: 05/08/21  Prenatal care site: [facility]   Current pregnancy complicated by:  - thyroid disease (she is not taking her Synthroid) - chronic migraines - anxiety - low platelets  Chief complaint: sent from office due to audible fetal arrhythmia and reports of RUQ/HA in last week.   S: Resting comfortably. no CTX, no VB.no LOF,  Active fetal movement.  Denies: HA, visual changes, SOB, or RUQ/epigastric pain  Maternal Medical History:   Past Medical History:  Diagnosis Date   Allergy    Chicken pox    Complication of anesthesia    WITH HER WISDOM TEETH SURGERY SHE HAD TO GO TO THE OR TO HAVE THIS DONE DUE TO TACHYCARDIA DUE TO GRAVES DISEASE   Complication of anesthesia    Dysmenorrhea    Family history of endometriosis    mother,sister   Graves disease    graves, hyperthyroidism    Headache    MIGRAINES   Migraine    Ovarian cyst    Panic attack    UTI (urinary tract infection)     Past Surgical History:  Procedure Laterality Date   APPENDECTOMY  2005   WITH DRAIN PLACED DUE TO ABSCESS 2005 [facility]   BREAST BIOPSY Right 2017   had a reaction to xylo or xylo with epi where her throat closed up   CHROMOPERTUBATION Right 08/12/2018   Procedure: CHROMOPERTUBATION;  Surgeon: Benjaman Kindler, MD;  Location: [facility] ORS;  Service: Gynecology;  Laterality: Right;   FOOT SURGERY Bilateral 2008   toes going outward surgery to turn toes inward 2010    LAPAROSCOPIC OVARIAN CYSTECTOMY Right 08/12/2018   Procedure: LAPAROSCOPIC OVARIAN CYSTECTOMY;  Surgeon: Benjaman Kindler, MD;  Location: [facility] ORS;  Service: Gynecology;  Laterality: Right;   LYSIS OF ADHESION Right 08/12/2018   Procedure: LYSIS OF ADHESIONS Lysis of Omental Adhesions, right cecum adhesions, right fallopian tube and right adnexal adhesions;  Surgeon: Benjaman Kindler, MD;  Location: [facility] ORS;  Service: Gynecology;  Laterality: Right;   TONSILLECTOMY AND ADENOIDECTOMY N/A 04/17/2017   Procedure: TONSILLECTOMY AND POSSIBLE  ADENOIDECTOMY;  Surgeon: Beverly Gust, MD;  Location: [facility] ORS;  Service: ENT;  Laterality: N/A;   WISDOM TOOTH EXTRACTION     2016    Allergies  Allergen Reactions   Lidocaine Anaphylaxis   Lidocaine-Epinephrine Anaphylaxis   Toradol [Ketorolac Tromethamine] Anaphylaxis and Rash    Throat swelling and feeling of heat over whole body.   Tramadol Anaphylaxis   Latex Rash   Linzess [Linaclotide]     Ab cramps    Tape Rash    Prior to Admission medications   Medication Sig Start Date End Date Taking? Authorizing Provider  montelukast (SINGULAIR) 10 MG tablet Take 10 mg by mouth at bedtime.    [provider]  Prenatal Vit-Fe Fumarate-FA (PRENATAL MULTIVITAMIN) TABS tablet Take 1 tablet by mouth daily at 12 noon.    [provider]      Social History: She  reports that she has never smoked. She has never used smokeless tobacco. She reports that she does not drink alcohol and does not use drugs.  Family History: family history includes Cancer in her maternal grandfather, maternal grandmother, paternal grandfather, and paternal grandmother; Diabetes in her father; Ruth Tran' disease in her maternal grandfather and another family member; Hyperlipidemia in her father; Hypertension in her mother;  Learning disabilities in her sister; Migraines in her maternal grandmother and mother; Miscarriages / Stillbirths in her maternal grandfather; Ovarian cancer in her paternal grandmother.   Review of Systems: A full review of systems was performed and negative except as noted in the HPI.     O:  BP 120/71   Pulse 97   Temp 98.3 F (36.8 C) (Oral)   Resp 16   LMP 08/01/2020 (Exact Date)  Results for orders placed or performed during the hospital encounter of 04/25/21 (from the past 48 hour(s))  Protein /  creatinine ratio, urine   Collection Time: 04/25/21  1:25 PM  Result Value Ref Range   Creatinine, Urine 89 mg/dL   Total Protein, Urine 16 mg/dL   Protein Creatinine Ratio 0.18 (H) 0.00 - 0.15 mg/mg[Cre]  CBC   Collection Time: 04/25/21  1:35 PM  Result Value Ref Range   WBC 8.3 4.0 - 10.5 K/uL   RBC 3.62 (L) 3.87 - 5.11 MIL/uL   Hemoglobin 10.0 (L) 12.0 - 15.0 g/dL   HCT 30.4 (L) 36.0 - 46.0 %   MCV 84.0 80.0 - 100.0 fL   MCH 27.6 26.0 - 34.0 pg   MCHC 32.9 30.0 - 36.0 g/dL   RDW 13.0 11.5 - 15.5 %   Platelets 156 150 - 400 K/uL   nRBC 0.0 0.0 - 0.2 %  Comprehensive metabolic panel   Collection Time: 04/25/21  1:35 PM  Result Value Ref Range   Sodium 135 135 - 145 mmol/L   Potassium 3.7 3.5 - 5.1 mmol/L   Chloride 108 98 - 111 mmol/L   CO2 23 22 - 32 mmol/L   Glucose, Bld 91 70 - 99 mg/dL   BUN 9 6 - 20 mg/dL   Creatinine, Ser 0.37 (L) 0.44 - 1.00 mg/dL   Calcium 8.7 (L) 8.9 - 10.3 mg/dL   Total Protein 5.9 (L) 6.5 - 8.1 g/dL   Albumin 2.7 (L) 3.5 - 5.0 g/dL   AST 20 15 - 41 U/L   ALT 9 0 - 44 U/L   Alkaline Phosphatase 104 38 - 126 U/L   Total Bilirubin 0.8 0.3 - 1.2 mg/dL   GFR, Estimated >60 >60 mL/min   Anion gap 4 (L) 5 - 15      Constitutional: NAD, AAOx3  HE/ENT: extraocular movements grossly intact, moist mucous membranes CV: RRR PULM: nl respiratory effort, CTABL     Abd: gravid, non-tender, non-distended, soft      Ext: Non-tender, bilateral LE edema, 1-2+;  DTR 3+, no clonus.    Psych: mood appropriate, speech normal Pelvic: deferred  Fetal  monitoring: Cat I Appropriate for GA Baseline: 130 bpm Variability: moderate Accelerations: present x >2 Decelerations absent Time 45mins    A/P: 30 y.o. [redacted]w[redacted]d here for antenatal surveillance for fetal arrhythmia and gestational edema  Principle Diagnosis:  fetal arrhythmia; 38wks, headache and swelling in pregnancy.   Labor: not present.  Fetal Wellbeing: Reassuring Cat 1 tracing with Reactive NST   Stable labs, no e/o HELLP or pre-eclampsia.  D/c home stable, precautions reviewed, follow-up as scheduled.    Ruth Tran, CNM 04/25/2021  3:24 PM

## 2021-04-25 NOTE — Progress Notes (Signed)
Patient is stable and VS within normal limits. Fetal Heart Rate monitored for an adequate amount of time. Patient informed to follow up with primary OB doctor within 24 hours.

## 2021-04-26 ENCOUNTER — Encounter
Admission: RE | Admit: 2021-04-26 | Discharge: 2021-04-26 | Disposition: A | Discharge status: Home / Self Care | Payer: BC Managed Care – PPO | Source: Ambulatory Visit | Attending: Anesthesiology | Admitting: Anesthesiology

## 2021-04-26 NOTE — Consult Note (Signed)
[facility] Anesthesia Consultation  Ruth Tran QVZ:[id number] DOB: 1991/05/26 DOA: 04/26/2021 PCP: McLean-Scocuzza, [name], MD   Requesting physician: Marcelline Mates Date of consultation: 04/26/21 Reason for consultation: Lidocaine allergy during pregnancy  CHIEF COMPLAINT:  Lidocaine allergy during pregnancy  HISTORY OF PRESENT ILLNESS: Ruth Tran  is a 30 y.o. female with a known history of hypothyroidism, migraine, anxiety, and lidocaine allergy (anaphylaxis) who presents for anesthesia consult for analgesia options during labor.   PAST MEDICAL HISTORY:   Past Medical History:  Diagnosis Date   Allergy    Chicken pox    Complication of anesthesia    WITH HER WISDOM TEETH SURGERY SHE HAD TO GO TO THE OR TO HAVE THIS DONE DUE TO TACHYCARDIA DUE TO GRAVES DISEASE   Complication of anesthesia    Dysmenorrhea    Family history of endometriosis    mother,sister   Graves disease    graves, hyperthyroidism    Headache    MIGRAINES   Migraine    Ovarian cyst    Panic attack    UTI (urinary tract infection)     PAST SURGICAL HISTORY:  Past Surgical History:  Procedure Laterality Date   APPENDECTOMY  2005   WITH DRAIN PLACED DUE TO ABSCESS 2005 [facility]   BREAST BIOPSY Right 2017   had a reaction to xylo or xylo with epi where her throat closed up   CHROMOPERTUBATION Right 08/12/2018   Procedure: CHROMOPERTUBATION;  Surgeon: Benjaman Kindler, MD;  Location: [facility] ORS;  Service: Gynecology;  Laterality: Right;   FOOT SURGERY Bilateral 2008   toes going outward surgery to turn toes inward 2010    LAPAROSCOPIC OVARIAN CYSTECTOMY Right 08/12/2018   Procedure: LAPAROSCOPIC OVARIAN CYSTECTOMY;  Surgeon: Benjaman Kindler, MD;  Location: [facility];  Service: Gynecology;  Laterality: Right;   LYSIS OF ADHESION Right 08/12/2018   Procedure: LYSIS OF ADHESIONS Lysis of Omental Adhesions, right cecum adhesions, right fallopian tube and right adnexal  adhesions;  Surgeon: Benjaman Kindler, MD;  Location: [facility];  Service: Gynecology;  Laterality: Right;   TONSILLECTOMY AND ADENOIDECTOMY N/A 04/17/2017   Procedure: TONSILLECTOMY AND POSSIBLE  ADENOIDECTOMY;  Surgeon: Beverly Gust, MD;  Location: [facility] ORS;  Service: ENT;  Laterality: N/A;   WISDOM TOOTH EXTRACTION     2016    SOCIAL HISTORY:  Social History   Tobacco Use   Smoking status: Never   Smokeless tobacco: Never  Substance Use Topics   Alcohol use: No    Alcohol/week: 0.0 standard drinks    FAMILY HISTORY:  Family History  Problem Relation Age of Onset   Graves' disease Other    Diabetes Father    Hyperlipidemia Father    Graves' disease Maternal Grandfather    Miscarriages / Stillbirths Maternal Grandfather    Cancer Maternal Grandfather        prostate/bladder    Ovarian cancer Paternal Grandmother    Cancer Paternal Grandmother        brain   Hypertension Mother    Migraines Mother    Learning disabilities Sister    Cancer Maternal Grandmother        lung (small cell) >liver>pancreas smoker    Migraines Maternal Grandmother    Cancer Paternal Grandfather        skin cancer    Breast cancer Neg Hx    Colon cancer Neg Hx    Heart disease Neg Hx     DRUG ALLERGIES:  Allergies  Allergen Reactions   Lidocaine  Anaphylaxis   Lidocaine-Epinephrine Anaphylaxis   Toradol [Ketorolac Tromethamine] Anaphylaxis and Rash    Throat swelling and feeling of heat over whole body.   Tramadol Anaphylaxis   Latex Rash   Linzess [Linaclotide]     Ab cramps    Tape Rash    REVIEW OF SYSTEMS:   RESPIRATORY: No cough, shortness of breath, wheezing.  CARDIOVASCULAR: No chest pain, orthopnea, edema.  HEMATOLOGY: No anemia, easy bruising or bleeding SKIN: No rash or lesion. NEUROLOGIC: No tingling, numbness, weakness.  PSYCHIATRY: No anxiety or depression.   MEDICATIONS AT HOME:  Prior to Admission medications   Medication Sig Start Date End Date Taking?  Authorizing Provider  montelukast (SINGULAIR) 10 MG tablet Take 10 mg by mouth at bedtime.    [provider]  Prenatal Vit-Fe Fumarate-FA (PRENATAL MULTIVITAMIN) TABS tablet Take 1 tablet by mouth daily at 12 noon.    [provider]      PHYSICAL EXAMINATION:   VITAL SIGNS: Last menstrual period 08/01/2020.  GENERAL:  30 y.o.-year-old patient no acute distress.  HEENT: Head atraumatic, normocephalic. Oropharynx and nasopharynx clear. MP I, TM distance >3 cm, normal mouth opening. LUNGS: No use of accessory muscles of respiration.   EXTREMITIES: No pedal edema, cyanosis, or clubbing.  NEUROLOGIC: normal gait PSYCHIATRIC: The patient is alert and oriented x 3.  SKIN: No obvious rash, lesion, or ulcer.    IMPRESSION AND PLAN:   Ruth Tran  is a 30 y.o. female presenting with lidocaine allergy during pregnancy. BMI is currently 37 at [redacted] weeks gestation.   We discussed analgesic options during labor including epidural analgesia. Patient would be a candidate for epidural anesthesia avoiding all amide local medication (including avoiding lidocaine for skin analgesia). Called pharmacy and spoke with Sam, who said that creation of a chloroprocaine and fentanyl epidural infusion bag would be possible.  Dosing should be the following:   Chloroprocaine 1.5% + Fentanyl 12mcg/mL to be infused at a basal rate of 12 mL/hr with optional PCEA boluses of 5 mL every 20 minutes  In addition, discussed use of epidural vs spinal vs GA if cesarean delivery is required. Discussed risk of difficult intubation during pregnancy should an emergency cesarean delivery be required.  Also discussed that all of the above is dependent on the discretion of the anesthesiologist in charge at the time of delivery, as well as availability of medications in the pharmacy.  Patient was understanding and agreeable to all.  All questions answered.

## 2021-05-05 ENCOUNTER — Other Ambulatory Visit: Payer: Self-pay

## 2021-05-05 ENCOUNTER — Encounter: Payer: Self-pay | Admitting: Obstetrics and Gynecology

## 2021-05-05 ENCOUNTER — Observation Stay
Admission: EM | Admit: 2021-05-05 | Discharge: 2021-05-05 | Disposition: A | Discharge status: Home / Self Care | Payer: BC Managed Care – PPO | Attending: Obstetrics | Admitting: Obstetrics

## 2021-05-05 DIAGNOSIS — Z91040 Latex allergy status: Secondary | ICD-10-CM | POA: Diagnosis not present

## 2021-05-05 DIAGNOSIS — O479 False labor, unspecified: Secondary | ICD-10-CM | POA: Diagnosis present

## 2021-05-05 DIAGNOSIS — O4193X0 Disorder of amniotic fluid and membranes, unspecified, third trimester, not applicable or unspecified: Secondary | ICD-10-CM | POA: Diagnosis not present

## 2021-05-05 DIAGNOSIS — O471 False labor at or after 37 completed weeks of gestation: Principal | ICD-10-CM | POA: Insufficient documentation

## 2021-05-05 DIAGNOSIS — Z3A39 39 weeks gestation of pregnancy: Secondary | ICD-10-CM | POA: Diagnosis not present

## 2021-05-05 DIAGNOSIS — Z79899 Other long term (current) drug therapy: Secondary | ICD-10-CM | POA: Insufficient documentation

## 2021-05-05 LAB — CHLAMYDIA/NGC RT PCR (ARMC ONLY)
Chlamydia Tr: NOT DETECTED
N gonorrhoeae: NOT DETECTED

## 2021-05-05 LAB — WET PREP, GENITAL
Clue Cells Wet Prep HPF POC: NONE SEEN
Sperm: NONE SEEN
Trich, Wet Prep: NONE SEEN
WBC, Wet Prep HPF POC: >=10 — AB (ref <10)
Yeast Wet Prep HPF POC: NONE SEEN

## 2021-05-05 LAB — URINALYSIS, COMPLETE (UACMP) WITH MICROSCOPIC
Bilirubin Urine: NEGATIVE
Glucose, UA: NEGATIVE mg/dL
Nitrite: NEGATIVE
Specific Gravity, Urine: 1.02 (ref 1.005–1.030)
WBC, UA: >50 WBC/hpf (ref 0–5)
pH: 7 (ref 5.0–8.0)

## 2021-05-05 LAB — RUPTURE OF MEMBRANE (ROM)PLUS: Rom Plus: NEGATIVE

## 2021-05-05 MED ORDER — CALCIUM CARBONATE ANTACID 500 MG PO CHEW: 2.0000 | CHEWABLE_TABLET | ORAL | Status: DC | PRN

## 2021-05-05 MED ORDER — ACETAMINOPHEN 325 MG PO TABS: 650.0000 mg | ORAL_TABLET | ORAL | Status: DC | PRN

## 2021-05-05 MED ORDER — DOCUSATE SODIUM 100 MG PO CAPS: 100.0000 mg | ORAL_CAPSULE | Freq: Every day | ORAL | Status: DC

## 2021-05-05 MED ORDER — PRENATAL MULTIVITAMIN CH: 1.0000 | ORAL_TABLET | Freq: Every day | ORAL | Status: DC

## 2021-05-05 MED ORDER — ACETAMINOPHEN 500 MG PO TABS: 1000.0000 mg | ORAL_TABLET | Freq: Four times a day (QID) | ORAL | 2 refills | Status: DC | PRN

## 2021-05-05 MED ORDER — NITROFURANTOIN MONOHYD MACRO 100 MG PO CAPS: 100.0000 mg | ORAL_CAPSULE | Freq: Two times a day (BID) | ORAL | 0 refills | Status: AC

## 2021-05-05 MED ORDER — ZOLPIDEM TARTRATE 5 MG PO TABS: 5.0000 mg | ORAL_TABLET | Freq: Every evening | ORAL | Status: DC | PRN

## 2021-05-05 NOTE — Progress Notes (Signed)
Discharge instructions provided to pt. Pt verbalizes understanding. Vaginal bleeding and discharge, contractions, and fetal movement reviewed by RN. Follow-up care reviewed. Pt discharged home with significant other.

## 2021-05-05 NOTE — OB Triage Note (Signed)
Pt is a 30yo G1P0, 39w 4d. She arrived to the unit with complaints of contractions every 3-72mins starting at 1815 today. She states that she thinks her water broke at 0200 today. She denies vaginal bleeding, reports positive fetal movement. VS stable, monitors applied and assessing.   Initial FHT 145 at 2043.

## 2021-05-05 NOTE — Discharge Summary (Signed)
Ruth Tran is a 30 y.o. female. She is at [redacted]w[redacted]d gestation. Patient's last menstrual period was 08/01/2020 (exact date). Estimated Date of Delivery: 05/08/21  Prenatal care site: [facility]  Chief complaint: LOF and uterine contractions.  HPI: Ruth Tran presents to L&D with complaints of LOF and contractions. Per RN Patient states that she believes her water broke at 0200 today and contractions started about 1815 today. Patient reports contractions every 3-5 mins apart. Patient now rates her pain a 6/10 in her lower back.  Factors complicating pregnancy: Hx of thyroid disease Hx of migraines w/o aura Low platelets Hx of anxiety  S: Resting comfortably. no CTX, no VB.no LOF,  Active fetal movement.   Maternal Medical History:  Past Medical Hx:  has a past medical history of Allergy, Chicken pox, Complication of anesthesia, Complication of anesthesia, Dysmenorrhea, Family history of endometriosis, Graves disease, Headache, Migraine, Ovarian cyst, Panic attack, and UTI (urinary tract infection).    Past Surgical Hx:  has a past surgical history that includes Foot surgery (Bilateral, 2008); Wisdom tooth extraction; Tonsillectomy and adenoidectomy (N/A, 04/17/2017); Breast biopsy (Right, 2017); Appendectomy (2005); Laparoscopic ovarian cystectomy (Right, 08/12/2018); Lysis of adhesion (Right, 08/12/2018); and Chromopertubation (Right, 08/12/2018).   Allergies  Allergen Reactions   Lidocaine Anaphylaxis   Lidocaine-Epinephrine Anaphylaxis   Toradol [Ketorolac Tromethamine] Anaphylaxis and Rash    Throat swelling and feeling of heat over whole body.   Tramadol Anaphylaxis   Latex Rash   Linzess [Linaclotide]     Ab cramps    Tape Rash     Prior to Admission medications   Medication Sig Start Date End Date Taking? Authorizing Provider  montelukast (SINGULAIR) 10 MG tablet Take 10 mg by mouth at bedtime.   Yes [provider]  pantoprazole (PROTONIX) 20 MG tablet  Take 20 mg by mouth daily.   Yes [provider]  Prenatal Vit-Fe Fumarate-FA (PRENATAL MULTIVITAMIN) TABS tablet Take 1 tablet by mouth daily at 12 noon.   Yes [provider]    Social History: She  reports that she has never smoked. She has never used smokeless tobacco. She reports that she does not drink alcohol and does not use drugs.  Family History: family history includes Cancer in her maternal grandfather, maternal grandmother, paternal grandfather, and paternal grandmother; Diabetes in her father; Berenice Primas' disease in her maternal grandfather and another family member; Hyperlipidemia in her father; Hypertension in her mother; Learning disabilities in her sister; Migraines in her maternal grandmother and mother; Miscarriages / Stillbirths in her maternal grandfather; Ovarian cancer in her paternal grandmother. ,no history of gyn cancers  Review of Systems: A full review of systems was performed and negative except as noted in the HPI.    O:  BP 118/69 (BP Location: Right Arm)   Pulse 99   Resp 16   LMP 08/01/2020 (Exact Date)  Results for orders placed or performed during the hospital encounter of 05/05/21 (from the past 48 hour(s))  Wet prep, genital   Collection Time: 05/05/21  9:14 PM  Result Value Ref Range   Yeast Wet Prep HPF POC NONE SEEN NONE SEEN   Trich, Wet Prep NONE SEEN NONE SEEN   Clue Cells Wet Prep HPF POC NONE SEEN NONE SEEN   WBC, Wet Prep HPF POC >=10 (A) <10   Sperm NONE SEEN   ROM Plus ([facility] only)   Collection Time: 05/05/21  9:14 PM  Result Value Ref Range   Rom Plus NEGATIVE  Urinalysis, Complete w Microscopic Urine, Clean Catch   Collection Time: 05/05/21 10:01 PM  Result Value Ref Range   Color, Urine YELLOW YELLOW   APPearance CLEAR CLEAR   Specific Gravity, Urine 1.020 1.005 - 1.030   pH 7.0 5.0 - 8.0   Glucose, UA NEGATIVE NEGATIVE mg/dL   Hgb urine dipstick TRACE (A) NEGATIVE   Bilirubin Urine NEGATIVE NEGATIVE   Ketones, ur  TRACE (A) NEGATIVE mg/dL   Protein, ur TRACE (A) NEGATIVE mg/dL   Nitrite NEGATIVE NEGATIVE   Leukocytes,Ua LARGE (A) NEGATIVE   Squamous Epithelial / LPF 21-50 0 - 5   WBC, UA >50 0 - 5 WBC/hpf   RBC / HPF 6-10 0 - 5 RBC/hpf   Bacteria, UA RARE (A) NONE SEEN   Mucus PRESENT       Constitutional: NAD, AAOx3  HE/ENT: extraocular movements grossly intact, moist mucous membranes CV: RRR PULM: nl respiratory effort, CTABL Abd: gravid, non-tender, non-distended, soft  Ext: Non-tender, Nonedmeatous Psych: mood appropriate, speech normal Pelvic : deferred SVE: Dilation: 2.5 Effacement (%): Thick Cervical Position: Posterior Exam by:: A Franks RN   Fetal Monitor: Baseline: 140 bpm Variability: moderate Accels: Present Decels: none Toco: none  Category: I   Assessment: 30 y.o. [redacted]w[redacted]d here for antenatal surveillance during pregnancy.  Principle diagnosis: LOF, uterine contractions There were no encounter diagnoses.   Plan: Labor: not present.  Teaching on signs of labor Rom plus- negative Wet prep >10 wbc  UA -large leukocytes, trace protein, hgb, ketones- will treat for uti GC/Chlamydia- pending Fetal Wellbeing: Reassuring Cat 1 tracing. Reactive NST  D/c home stable, precautions reviewed, follow-up as scheduled.   ----- Avelino Leeds, CNM Certified Nurse Midwife [facility]

## 2021-05-08 ENCOUNTER — Inpatient Hospital Stay (HOSPITAL_COMMUNITY)
Admission: RE | Admit: 2021-05-08 | Payer: BC Managed Care – PPO | Source: Home / Self Care | Admitting: Obstetrics and Gynecology

## 2021-05-12 ENCOUNTER — Other Ambulatory Visit: Payer: Self-pay

## 2021-05-12 ENCOUNTER — Other Ambulatory Visit: Payer: Self-pay | Admitting: Obstetrics and Gynecology

## 2021-05-12 ENCOUNTER — Other Ambulatory Visit
Admission: RE | Admit: 2021-05-12 | Discharge: 2021-05-12 | Disposition: A | Discharge status: Home / Self Care | Payer: BC Managed Care – PPO | Source: Ambulatory Visit | Attending: Obstetrics and Gynecology | Admitting: Obstetrics and Gynecology

## 2021-05-12 DIAGNOSIS — Z01812 Encounter for preprocedural laboratory examination: Secondary | ICD-10-CM | POA: Insufficient documentation

## 2021-05-12 DIAGNOSIS — Z1152 Encounter for screening for COVID-19: Secondary | ICD-10-CM

## 2021-05-12 DIAGNOSIS — Z20822 Contact with and (suspected) exposure to covid-19: Secondary | ICD-10-CM | POA: Insufficient documentation

## 2021-05-12 NOTE — Progress Notes (Signed)
G1P0000 at [redacted]w[redacted]d, LMP of 08/01/2020, c/w early [place].  Scheduled for induction of labor for post-dates on 05/13/2021.   Prenatal provider: [facility] Pregnancy complicated by: Anemia  HX of Thyroid Disease- Graves Dz Migraine without Aura Anxiety  Prenatal Labs: Blood type/Rh A pos  Antibody screen neg  Rubella Immune  Varicella Immune  RPR NR  HBsAg Neg  HIV NR  GC neg  Chlamydia neg  Genetic screening declined  1 hour GTT 107  3 hour GTT N/a  GBS neg   Tdap: 02/18/2021 Flu: 02/18/2021 Contraception: POP Feeding preference: breast  ____ Ruth Tran, CNM  Certified Nurse Midwife [facility]

## 2021-05-13 ENCOUNTER — Inpatient Hospital Stay
Admission: EM | Admit: 2021-05-13 | Discharge: 2021-05-15 | DRG: 768 | Disposition: A | Discharge status: Home / Self Care | Payer: BC Managed Care – PPO | Attending: Obstetrics and Gynecology | Admitting: Obstetrics and Gynecology

## 2021-05-13 ENCOUNTER — Encounter: Payer: Self-pay | Admitting: Obstetrics and Gynecology

## 2021-05-13 ENCOUNTER — Inpatient Hospital Stay: Payer: BC Managed Care – PPO | Admitting: Anesthesiology

## 2021-05-13 ENCOUNTER — Other Ambulatory Visit: Payer: Self-pay

## 2021-05-13 DIAGNOSIS — D62 Acute posthemorrhagic anemia: Secondary | ICD-10-CM | POA: Diagnosis not present

## 2021-05-13 DIAGNOSIS — Z3A40 40 weeks gestation of pregnancy: Secondary | ICD-10-CM

## 2021-05-13 DIAGNOSIS — O9081 Anemia of the puerperium: Secondary | ICD-10-CM | POA: Diagnosis not present

## 2021-05-13 DIAGNOSIS — Z3490 Encounter for supervision of normal pregnancy, unspecified, unspecified trimester: Secondary | ICD-10-CM | POA: Diagnosis present

## 2021-05-13 DIAGNOSIS — Z20822 Contact with and (suspected) exposure to covid-19: Secondary | ICD-10-CM | POA: Diagnosis present

## 2021-05-13 DIAGNOSIS — O480 Post-term pregnancy: Secondary | ICD-10-CM | POA: Diagnosis present

## 2021-05-13 LAB — TYPE AND SCREEN
ABO/RH(D): A POS
Antibody Screen: NEGATIVE

## 2021-05-13 LAB — CBC
HCT: 31.1 % — ABNORMAL LOW (ref 36.0–46.0)
Hemoglobin: 10.1 g/dL — ABNORMAL LOW (ref 12.0–15.0)
MCH: 26.2 pg (ref 26.0–34.0)
MCHC: 32.5 g/dL (ref 30.0–36.0)
MCV: 80.8 fL (ref 80.0–100.0)
Platelets: 179 10*3/uL (ref 150–400)
RBC: 3.85 MIL/uL — ABNORMAL LOW (ref 3.87–5.11)
RDW: 13.5 % (ref 11.5–15.5)
WBC: 9.9 10*3/uL (ref 4.0–10.5)
nRBC: 0 % (ref 0.0–0.2)

## 2021-05-13 LAB — SARS CORONAVIRUS 2 (TAT 6-24 HRS): SARS Coronavirus 2: NEGATIVE

## 2021-05-13 LAB — RPR: RPR Ser Ql: NONREACTIVE

## 2021-05-13 MED ORDER — ACETAMINOPHEN 325 MG PO TABS: 650.0000 mg | ORAL_TABLET | ORAL | Status: DC | PRN

## 2021-05-13 MED ORDER — TERBUTALINE SULFATE 1 MG/ML IJ SOLN
0.2500 mg | Freq: Once | INTRAMUSCULAR | Status: AC | PRN
  Administered 2021-05-13: 0.25 mg via SUBCUTANEOUS
  Filled 2021-05-13: qty 1

## 2021-05-13 MED ORDER — EPHEDRINE 5 MG/ML INJ: 10.0000 mg | INTRAVENOUS | Status: DC | PRN

## 2021-05-13 MED ORDER — COCONUT OIL OIL
1.0000 "application " | TOPICAL_OIL | Status: DC | PRN
  Filled 2021-05-13: qty 120

## 2021-05-13 MED ORDER — MISOPROSTOL 25 MCG QUARTER TABLET
ORAL_TABLET | ORAL | Status: AC
  Filled 2021-05-13: qty 1

## 2021-05-13 MED ORDER — ONDANSETRON HCL 4 MG/2ML IJ SOLN: 4.0000 mg | INTRAMUSCULAR | Status: DC | PRN

## 2021-05-13 MED ORDER — CHLOROPROCAINE HCL (PF) 3 % IJ SOLN
20.0000 mL | Freq: Once | INTRAMUSCULAR | Status: DC
  Filled 2021-05-13: qty 20

## 2021-05-13 MED ORDER — OXYTOCIN-SODIUM CHLORIDE 30-0.9 UT/500ML-% IV SOLN
2.5000 [IU]/h | INTRAVENOUS | Status: DC
  Administered 2021-05-13: 2.5 [IU]/h via INTRAVENOUS

## 2021-05-13 MED ORDER — FENTANYL CITRATE (PF) 100 MCG/2ML IJ SOLN
INTRAMUSCULAR | Status: DC | PRN
  Administered 2021-05-13: 100 ug via EPIDURAL

## 2021-05-13 MED ORDER — IBUPROFEN 600 MG PO TABS
600.0000 mg | ORAL_TABLET | Freq: Four times a day (QID) | ORAL | Status: DC
  Administered 2021-05-13 – 2021-05-15 (×7): 600 mg via ORAL
  Filled 2021-05-13 (×7): qty 1

## 2021-05-13 MED ORDER — MISOPROSTOL 25 MCG QUARTER TABLET
25.0000 ug | ORAL_TABLET | Freq: Once | ORAL | Status: AC
  Administered 2021-05-13: 25 ug via VAGINAL
  Filled 2021-05-13: qty 1

## 2021-05-13 MED ORDER — DIPHENHYDRAMINE HCL 25 MG PO CAPS: 25.0000 mg | ORAL_CAPSULE | Freq: Four times a day (QID) | ORAL | Status: DC | PRN

## 2021-05-13 MED ORDER — ACETAMINOPHEN 325 MG PO TABS
650.0000 mg | ORAL_TABLET | ORAL | Status: DC | PRN
  Administered 2021-05-13 – 2021-05-15 (×5): 650 mg via ORAL
  Filled 2021-05-13 (×5): qty 2

## 2021-05-13 MED ORDER — LACTATED RINGERS IV SOLN: INTRAVENOUS | Status: DC

## 2021-05-13 MED ORDER — PHENYLEPHRINE 40 MCG/ML (10ML) SYRINGE FOR IV PUSH (FOR BLOOD PRESSURE SUPPORT): 80.0000 ug | PREFILLED_SYRINGE | INTRAVENOUS | Status: DC | PRN

## 2021-05-13 MED ORDER — FERROUS SULFATE 325 (65 FE) MG PO TABS
325.0000 mg | ORAL_TABLET | Freq: Two times a day (BID) | ORAL | Status: DC
  Administered 2021-05-14 – 2021-05-15 (×3): 325 mg via ORAL
  Filled 2021-05-13 (×3): qty 1

## 2021-05-13 MED ORDER — OXYCODONE HCL 5 MG PO TABS
5.0000 mg | ORAL_TABLET | ORAL | Status: DC | PRN
  Administered 2021-05-13 – 2021-05-15 (×2): 5 mg via ORAL
  Filled 2021-05-13 (×2): qty 1

## 2021-05-13 MED ORDER — PRENATAL MULTIVITAMIN CH
1.0000 | ORAL_TABLET | Freq: Every day | ORAL | Status: DC
  Administered 2021-05-14 – 2021-05-15 (×2): 1 via ORAL
  Filled 2021-05-13 (×2): qty 1

## 2021-05-13 MED ORDER — OXYCODONE HCL 5 MG PO TABS: 10.0000 mg | ORAL_TABLET | ORAL | Status: DC | PRN

## 2021-05-13 MED ORDER — BENZOCAINE-MENTHOL 20-0.5 % EX AERO
1.0000 "application " | INHALATION_SPRAY | CUTANEOUS | Status: DC | PRN
  Filled 2021-05-13: qty 56

## 2021-05-13 MED ORDER — AMMONIA AROMATIC IN INHA
RESPIRATORY_TRACT | Status: AC
  Filled 2021-05-13: qty 10

## 2021-05-13 MED ORDER — DOCUSATE SODIUM 100 MG PO CAPS: 100.0000 mg | ORAL_CAPSULE | Freq: Two times a day (BID) | ORAL | Status: DC

## 2021-05-13 MED ORDER — OXYTOCIN-SODIUM CHLORIDE 30-0.9 UT/500ML-% IV SOLN
1.0000 m[IU]/min | INTRAVENOUS | Status: DC
  Filled 2021-05-13: qty 500

## 2021-05-13 MED ORDER — LIDOCAINE HCL (PF) 1 % IJ SOLN
INTRAMUSCULAR | Status: AC
  Filled 2021-05-13: qty 30

## 2021-05-13 MED ORDER — FENTANYL CITRATE (PF) 100 MCG/2ML IJ SOLN
50.0000 ug | INTRAMUSCULAR | Status: DC | PRN
  Administered 2021-05-13: 100 ug via INTRAVENOUS
  Administered 2021-05-13: 50 ug via INTRAVENOUS
  Filled 2021-05-13 (×2): qty 2

## 2021-05-13 MED ORDER — ONDANSETRON HCL 4 MG/2ML IJ SOLN
4.0000 mg | Freq: Four times a day (QID) | INTRAMUSCULAR | Status: DC | PRN
  Administered 2021-05-13: 4 mg via INTRAVENOUS
  Filled 2021-05-13: qty 2

## 2021-05-13 MED ORDER — LACTATED RINGERS IV SOLN
500.0000 mL | Freq: Once | INTRAVENOUS | Status: AC
  Administered 2021-05-13: 500 mL via INTRAVENOUS

## 2021-05-13 MED ORDER — MISOPROSTOL 25 MCG QUARTER TABLET
25.0000 ug | ORAL_TABLET | Freq: Once | ORAL | Status: AC
  Administered 2021-05-13: 25 ug via BUCCAL
  Filled 2021-05-13: qty 1

## 2021-05-13 MED ORDER — TERBUTALINE SULFATE 1 MG/ML IJ SOLN
0.2500 mg | Freq: Once | INTRAMUSCULAR | Status: AC
  Administered 2021-05-13: 0.25 mg via SUBCUTANEOUS
  Filled 2021-05-13: qty 1

## 2021-05-13 MED ORDER — TETANUS-DIPHTH-ACELL PERTUSSIS 5-2.5-18.5 LF-MCG/0.5 IM SUSY
0.5000 mL | PREFILLED_SYRINGE | Freq: Once | INTRAMUSCULAR | Status: DC
  Filled 2021-05-13: qty 0.5

## 2021-05-13 MED ORDER — MISOPROSTOL 25 MCG QUARTER TABLET
25.0000 ug | ORAL_TABLET | ORAL | Status: DC
  Administered 2021-05-13: 25 ug via BUCCAL

## 2021-05-13 MED ORDER — FENTANYL CITRATE (PF) 100 MCG/2ML IJ SOLN
INTRAMUSCULAR | Status: AC
  Filled 2021-05-13: qty 2

## 2021-05-13 MED ORDER — WITCH HAZEL-GLYCERIN EX PADS
MEDICATED_PAD | CUTANEOUS | Status: DC | PRN
  Filled 2021-05-13 (×2): qty 100

## 2021-05-13 MED ORDER — SODIUM CHLORIDE 0.9 % IV SOLN
Freq: Once | INTRAVENOUS | Status: DC
  Filled 2021-05-13: qty 60

## 2021-05-13 MED ORDER — LACTATED RINGERS IV SOLN
500.0000 mL | INTRAVENOUS | Status: DC | PRN
Start: 2021-05-13 — End: 2021-05-13
  Administered 2021-05-13: 1000 mL via INTRAVENOUS

## 2021-05-13 MED ORDER — DIPHENHYDRAMINE HCL 50 MG/ML IJ SOLN: 12.5000 mg | INTRAMUSCULAR | Status: DC | PRN

## 2021-05-13 MED ORDER — SIMETHICONE 80 MG PO CHEW: 80.0000 mg | CHEWABLE_TABLET | ORAL | Status: DC | PRN

## 2021-05-13 MED ORDER — MISOPROSTOL 200 MCG PO TABS
ORAL_TABLET | ORAL | Status: AC
  Filled 2021-05-13: qty 4

## 2021-05-13 MED ORDER — OXYTOCIN 10 UNIT/ML IJ SOLN
INTRAMUSCULAR | Status: AC
  Filled 2021-05-13: qty 2

## 2021-05-13 MED ORDER — CHLOROPROCAINE HCL (PF) 3 % IJ SOLN
120.0000 mL | INTRAMUSCULAR | Status: DC
Start: 2021-05-13 — End: 2021-05-13

## 2021-05-13 MED ORDER — OXYTOCIN BOLUS FROM INFUSION
333.0000 mL | Freq: Once | INTRAVENOUS | Status: AC
  Administered 2021-05-13: 333 mL via INTRAVENOUS

## 2021-05-13 MED ORDER — CHLOROPROCAINE HCL (PF) 3 % IJ SOLN
INTRAMUSCULAR | Status: DC
Start: 2021-05-13 — End: 2021-05-13
  Administered 2021-05-13: 120 mL via EPIDURAL
  Administered 2021-05-13 (×3): 5 mL via EPIDURAL
  Filled 2021-05-13 (×3): qty 60

## 2021-05-13 MED ORDER — ONDANSETRON HCL 4 MG PO TABS
4.0000 mg | ORAL_TABLET | ORAL | Status: DC | PRN
  Administered 2021-05-15: 11:00:00 4 mg via ORAL
  Filled 2021-05-13 (×2): qty 1

## 2021-05-13 MED ORDER — SOD CITRATE-CITRIC ACID 500-334 MG/5ML PO SOLN: 30.0000 mL | ORAL | Status: DC | PRN

## 2021-05-13 MED FILL — Sodium Chloride IV Soln 0.9%: INTRAVENOUS | Qty: 100 | Status: AC

## 2021-05-13 MED FILL — Chloroprocaine HCl Preservative Free (PF) Inj 3%: INTRAMUSCULAR | Qty: 60 | Status: AC

## 2021-05-13 NOTE — H&P (Signed)
OB History & Physical   History of Present Illness:  Chief Complaint:   HPI:  Ruth Tran is a 30 y.o. G1P0000 female at [redacted]w[redacted]d dated by LMP.  She presents to L&D for IOL for post-dates.  She reports:  -active fetal movement -no leakage of fluid -no vaginal bleeding -no contractions  Pregnancy Issues: 1. Anemia  HX of Thyroid Disease- Graves Dz Migraine without Aura Anxiety   Maternal Medical History:   Past Medical History:  Diagnosis Date   Allergy    Chicken pox    Complication of anesthesia    WITH HER WISDOM TEETH SURGERY SHE HAD TO GO TO THE OR TO HAVE THIS DONE DUE TO TACHYCARDIA DUE TO GRAVES DISEASE   Complication of anesthesia    Dysmenorrhea    Family history of endometriosis    mother,sister   Graves disease    graves, hyperthyroidism    Headache    MIGRAINES   Migraine    Ovarian cyst    Panic attack    UTI (urinary tract infection)     Past Surgical History:  Procedure Laterality Date   APPENDECTOMY  2005   WITH DRAIN PLACED DUE TO ABSCESS 2005 [facility]   BREAST BIOPSY Right 2017   had a reaction to xylo or xylo with epi where her throat closed up   CHROMOPERTUBATION Right 08/12/2018   Procedure: CHROMOPERTUBATION;  Surgeon: Benjaman Kindler, MD;  Location: [facility];  Service: Gynecology;  Laterality: Right;   FOOT SURGERY Bilateral 2008   toes going outward surgery to turn toes inward 2010    LAPAROSCOPIC OVARIAN CYSTECTOMY Right 08/12/2018   Procedure: LAPAROSCOPIC OVARIAN CYSTECTOMY;  Surgeon: Benjaman Kindler, MD;  Location: [facility];  Service: Gynecology;  Laterality: Right;   LYSIS OF ADHESION Right 08/12/2018   Procedure: LYSIS OF ADHESIONS Lysis of Omental Adhesions, right cecum adhesions, right fallopian tube and right adnexal adhesions;  Surgeon: Benjaman Kindler, MD;  Location: [facility];  Service: Gynecology;  Laterality: Right;   TONSILLECTOMY AND ADENOIDECTOMY N/A 04/17/2017   Procedure: TONSILLECTOMY AND POSSIBLE  ADENOIDECTOMY;   Surgeon: Beverly Gust, MD;  Location: [facility];  Service: ENT;  Laterality: N/A;   WISDOM TOOTH EXTRACTION     2016    Allergies  Allergen Reactions   Lidocaine Anaphylaxis   Lidocaine-Epinephrine Anaphylaxis   Toradol [Ketorolac Tromethamine] Anaphylaxis and Rash    Throat swelling and feeling of heat over whole body.   Tramadol Anaphylaxis   Latex Rash   Linzess [Linaclotide]     Ab cramps    Tape Rash    Prior to Admission medications   Medication Sig Start Date End Date Taking? Authorizing Provider  acetaminophen (TYLENOL) 500 MG tablet Take 2 tablets (1,000 mg total) by mouth every 6 (six) hours as needed for moderate pain. 05/05/21 05/05/22 Yes Dickerson, Felicia Lucy Lorena, CNM  montelukast (SINGULAIR) 10 MG tablet Take 10 mg by mouth at bedtime.   Yes [provider]  pantoprazole (PROTONIX) 20 MG tablet Take 20 mg by mouth daily.   Yes [provider]  Prenatal Vit-Fe Fumarate-FA (PRENATAL MULTIVITAMIN) TABS tablet Take 1 tablet by mouth daily at 12 noon.   Yes [provider]     Prenatal care site: [facility] History: She  reports that she has never smoked. She has never used smokeless tobacco. She reports that she does not drink alcohol and does not use drugs.  Family History: family history includes Cancer in her  maternal grandfather, maternal grandmother, paternal grandfather, and paternal grandmother; Diabetes in her father; Berenice Primas' disease in her maternal grandfather and another family member; Hyperlipidemia in her father; Hypertension in her mother; Learning disabilities in her sister; Migraines in her maternal grandmother and mother; Miscarriages / Stillbirths in her maternal grandfather; Ovarian cancer in her paternal grandmother.   Review of Systems: A full review of systems was performed and negative except as noted in the HPI.    Physical Exam:  Vital Signs: BP 118/75 (BP Location: Left Arm)    Pulse 87     Temp 97.9 F (36.6 C) (Oral)    Resp 16    Ht 5\' 5"  (1.651 m)    Wt 99.6 kg    LMP 08/01/2020 (Exact Date)    BMI 36.54 kg/m   General:   alert and cooperative  Skin:  normal  Neurologic:    Alert & oriented x 3  Lungs:    Nl effort  Heart:   regular rate and rhythm  Abdomen:  soft, non-tender; bowel sounds normal; no masses,  no organomegaly  Extremities: : non-tender, symmetric, no edema bilaterally.  DTRs: n/a     Results for orders placed or performed during the hospital encounter of 05/13/21 (from the past 24 hour(s))  CBC     Status: Abnormal   Collection Time: 05/13/21  5:13 AM  Result Value Ref Range   WBC 9.9 4.0 - 10.5 K/uL   RBC 3.85 (L) 3.87 - 5.11 MIL/uL   Hemoglobin 10.1 (L) 12.0 - 15.0 g/dL   HCT 31.1 (L) 36.0 - 46.0 %   MCV 80.8 80.0 - 100.0 fL   MCH 26.2 26.0 - 34.0 pg   MCHC 32.5 30.0 - 36.0 g/dL   RDW 13.5 11.5 - 15.5 %   Platelets 179 150 - 400 K/uL   nRBC 0.0 0.0 - 0.2 %  Type and screen     Status: None   Collection Time: 05/13/21  5:13 AM  Result Value Ref Range   ABO/RH(D) A POS    Antibody Screen NEG    Sample Expiration      05/16/2021,2359 Performed at [facility], [address], [place], [place] [address]     Pertinent Results:  Prenatal Labs: Blood type/Rh A pos  Antibody screen neg  Rubella Immune  Varicella Immune  RPR NR  HBsAg Neg  HIV NR  GC neg  Chlamydia neg  Genetic screening declined  1 hour GTT 107  3 hour GTT N/a  GBS neg   FHT: FHR: 120 bpm, variability: moderate,  accelerations:  Present,  decelerations:  Absent Category/reactivity:  Category I TOCO: none SVE: Dilation: 2.5 / Effacement (%): 20 / Station: -3     Assessment:  Ruth Tran is a 30 y.o. G1P0000 female at [redacted]w[redacted]d for IOL for post-dates.   Plan:  1. Admit to Labor & Delivery; consents reviewed and obtained  2. Fetal Well being  - Fetal Tracing: Cat I - GBS neg - Presentation: vtx confirmed by SVE   3. Routine OB: - Prenatal labs  reviewed, as above - Rh pos - CBC & T&S on admit - Clear fluids, IVF  4. Induction of Labor -  Contractions by external toco in place -  Plan for induction with Cytotec, Pitocin and possibly AROM -  Plan for continuous fetal monitoring  -  Maternal pain control as desired: IVPM, nitrous, regional anesthesia - Anticipate vaginal delivery  5. Post Partum Planning: -  Infant feeding: Breastfeeding - Contraception: POP - Tdap: 02/18/21 - Flu: 02/18/21  Linda Hedges, CNM 05/13/2021 8:29 AM

## 2021-05-13 NOTE — Discharge Summary (Signed)
Obstetrical Discharge Summary  Patient Name: Ruth Tran DOB: 02-05-1991 MRN: [id number]  Date of Admission: 05/13/2021 Date of Delivery: 05/15/2021  Delivered by: Huel Cote MD Date of Discharge: 05/15/2021  Primary OB: [facility]  HDQ:[id number] last menstrual period was 08/01/2020 (exact date). EDC Estimated Date of Delivery: 05/08/21 Gestational Age at Delivery: [redacted]w[redacted]d   Antepartum complications:  Anemia  HX of Thyroid Disease- Graves Dz Migraine without Aura Anxiety Admitting Diagnosis: IOL for post-dates Secondary Diagnosis: Patient Active Problem List   Diagnosis Date Noted   Forceps delivery 05/13/2021   Third degree laceration of perineum during delivery, postpartum 05/13/2021   Encounter for supervision of normal pregnancy 09/30/2020   Patellofemoral syndrome of right knee 12/19/2019   Chronic pain of right knee 09/12/2019   Allergic rhinitis 09/12/2019   Ovarian cyst 10/22/2018   Migraine without aura and without status migrainosus, not intractable 09/19/2017   Hyperthyroidism 09/19/2017   Seasonal allergies 09/19/2017   Graves disease 01/22/2014   Vitamin D deficiency disease 01/22/2014    Augmentation: AROM and Cytotec Complications: None Intrapartum complications/course: prolonged fetal bradycardia. ILF  simpsons forcep delivery  Date of Delivery: 05/15/2021  Delivered By: Huel Cote MD Delivery Type: forceps, low Anesthesia: epidural Placenta: spontaneous Laceration: 3rd degree Episiotomy: Midline Newborn Data: Live born female  Birth Weight: 7 lb 4.4 oz (3300 g) APGAR: 7, 8 Gas : 7.03 / 69/18 Newborn Delivery   Birth date/time: 05/13/2021 17:24:00 Delivery type: Vaginal, Forceps     Postpartum Procedures: None  Edinburgh:  Edinburgh Postnatal Depression Scale Screening Tool 05/14/2021 05/14/2021 05/13/2021  I have been able to laugh and see the funny side of things. 0 (No Data) (No Data)  I have looked forward with  enjoyment to things. 0 - -  I have blamed myself unnecessarily when things went wrong. 0 - -  I have been anxious or worried for no good reason. 0 - -  I have felt scared or panicky for no good reason. 1 - -  Things have been getting on top of me. 0 - -  I have been so unhappy that I have had difficulty sleeping. 0 - -  I have felt sad or miserable. 0 - -  I have been so unhappy that I have been crying. 0 - -  The thought of harming myself has occurred to me. 0 - -  Edinburgh Postnatal Depression Scale Total 1 - -    Post partum course:  Patient had an uncomplicated postpartum course.  By time of discharge on PPD#2, her pain was controlled on oral pain medications; she had appropriate lochia and was ambulating, voiding without difficulty and tolerating regular diet.  She was deemed stable for discharge to home.    Discharge Physical Exam:  BP 117/75 (BP Location: Right Arm)    Pulse 81    Temp (!) 97.3 F (36.3 C) (Oral)    Resp 18    Ht 5\' 5"  (1.651 m)    Wt 99.6 kg    LMP 08/01/2020 (Exact Date)    SpO2 99%    Breastfeeding Unknown    BMI 36.54 kg/m   General: NAD CV: RRR Pulm: CTABL, nl effort ABD: s/nd/nt, fundus firm and below the umbilicus Lochia: moderate Perineum: minimal edema, repair well approximated  DVT Evaluation: LE non-ttp, no evidence of DVT on exam.  Hemoglobin  Date Value Ref Range Status  05/14/2021 8.4 (L) 12.0 - 15.0 g/dL Final  12/23/2018 13.3 11.1 - 15.9 g/dL Final  HCT  Date Value Ref Range Status  05/14/2021 26.0 (L) 36.0 - 46.0 % Final   Hematocrit  Date Value Ref Range Status  12/23/2018 39.9 34.0 - 46.6 % Final     Disposition: stable, discharge to home. Baby Feeding: breastmilk and formula  Baby Disposition: home with mom  Rh Immune globulin given: n/a Rubella vaccine given: Immune Tdap vaccine given in AP or PP setting: 02/18/21 Flu vaccine given in AP or PP setting: 02/18/21  Contraception: POPs  Prenatal Labs:   Blood type/Rh A  pos  Antibody screen neg  Rubella Immune  Varicella Immune  RPR NR  HBsAg Neg  HIV NR  GC neg  Chlamydia neg  Genetic screening declined  1 hour GTT 107  3 hour GTT N/a  GBS neg    Plan:  Ruth Tran was discharged to home in good condition. Follow-up appointment with delivering provider in 6 weeks.  Discharge Medications: Allergies as of 05/15/2021       Reactions   Lidocaine Anaphylaxis   Lidocaine-epinephrine Anaphylaxis   Toradol [ketorolac Tromethamine] Anaphylaxis, Rash   Throat swelling and feeling of heat over whole body.   Tramadol Anaphylaxis   Latex Rash   Linzess [linaclotide]    Ab cramps   Tape Rash        Medication List     TAKE these medications    acetaminophen 500 MG tablet Commonly known as: TYLENOL Take 2 tablets (1,000 mg total) by mouth every 6 (six) hours as needed for moderate pain.   ferrous sulfate 325 (65 FE) MG tablet Take 1 tablet (325 mg total) by mouth daily with breakfast.   montelukast 10 MG tablet Commonly known as: SINGULAIR Take 10 mg by mouth at bedtime.   pantoprazole 20 MG tablet Commonly known as: PROTONIX Take 20 mg by mouth daily.   prenatal multivitamin Tabs tablet Take 1 tablet by mouth daily at 12 noon.   senna-docusate 8.6-50 MG tablet Commonly known as: Senokot-S Take 1 tablet by mouth daily. Start taking on: May 16, 2021         Follow-up Information     Schermerhorn, Gwen Her, MD. Schedule an appointment as soon as possible for a visit in 2 week(s).   Specialty: Obstetrics and Gynecology Why: post-op incision check Contact information: [address] [place] [address] [phone]                 Signed:  Drinda Butts, CNM Certified Nurse Midwife [facility]

## 2021-05-13 NOTE — Anesthesia Preprocedure Evaluation (Signed)
Anesthesia Evaluation  Patient identified by MRN, date of birth, ID band Patient awake    Reviewed: Allergy & Precautions, NPO status , Patient's Chart, lab work & pertinent test results  History of Anesthesia Complications (+) history of anesthetic complications (Throat swelling after lidocaine injection during perioperative period of breast procedure 2017)  Airway Mallampati: III  TM Distance: >3 FB Neck ROM: full    Dental  (+) Chipped   Pulmonary neg pulmonary ROS,    Pulmonary exam normal        Cardiovascular Exercise Tolerance: Good negative cardio ROS Normal cardiovascular exam     Neuro/Psych  Headaches (controlled, infrequent), Anxiety    GI/Hepatic negative GI ROS,   Endo/Other  Hyperthyroidism   Renal/GU   negative genitourinary   Musculoskeletal   Abdominal   Peds  Hematology negative hematology ROS (+)   Anesthesia Other Findings Pt reports that lidocaine caused throat swelling and shortness of breath that occurred in the perioperative period of a breast procedure in 2017 requiring IV epinephrine as treatment. She did not see an allergist for testing. She did receive lidocaine in 2016 during a procedure without noted complications. Prior to admission, anesthesia had discussed the use of chloroprocaine with fentanyl for an epidural infusion to which we had agreed. Today, the pharmacy will provide plain chloroprocaine for infusion.  Past Medical History: No date: Allergy No date: Chicken pox No date: Complication of anesthesia     Comment:  WITH HER WISDOM TEETH SURGERY SHE HAD TO GO TO THE OR TO              HAVE THIS DONE DUE TO TACHYCARDIA DUE TO GRAVES DISEASE No date: Complication of anesthesia No date: Dysmenorrhea No date: Family history of endometriosis     Comment:  mother,sister No date: Graves disease     Comment:  graves, hyperthyroidism  No date: Headache     Comment:  MIGRAINES No  date: Migraine No date: Ovarian cyst No date: Panic attack No date: UTI (urinary tract infection)  Past Surgical History: 2005: APPENDECTOMY     Comment:  WITH DRAIN PLACED DUE TO ABSCESS 2005 [facility] 2017: BREAST BIOPSY; Right     Comment:  had a reaction to xylo or xylo with epi where her throat              closed up 08/12/2018: CHROMOPERTUBATION; Right     Comment:  Procedure: CHROMOPERTUBATION;  Surgeon: Benjaman Kindler, MD;  Location: [facility];  Service: Gynecology;                Laterality: Right; 2008: FOOT SURGERY; Bilateral     Comment:  toes going outward surgery to turn toes inward 2010  08/12/2018: [place]; Right     Comment:  Procedure: [place];  Surgeon:               Benjaman Kindler, MD;  Location: [facility];  Service:               Gynecology;  Laterality: Right; 08/12/2018: LYSIS OF ADHESION; Right     Comment:  Procedure: LYSIS OF ADHESIONS Lysis of Omental               Adhesions, right cecum adhesions, right fallopian tube               and right adnexal adhesions;  Surgeon: Benjaman Kindler,  MD;  Location: [facility];  Service: Gynecology;                Laterality: Right; 04/17/2017: TONSILLECTOMY AND ADENOIDECTOMY; N/A     Comment:  Procedure: TONSILLECTOMY AND POSSIBLE  ADENOIDECTOMY;                Surgeon: Beverly Gust, MD;  Location: [facility];                Service: ENT;  Laterality: N/A; No date: WISDOM TOOTH EXTRACTION     Comment:  2016  BMI    Body Mass Index: 36.54 kg/m      Reproductive/Obstetrics (+) Pregnancy                             Anesthesia Physical Anesthesia Plan  ASA: 2  Anesthesia Plan: Epidural and General   Post-op Pain Management:    Induction:   PONV Risk Score and Plan:   Airway Management Planned: Natural Airway  Additional Equipment:   Intra-op Plan:   Post-operative Plan:   Informed Consent: I have  reviewed the patients History and Physical, chart, labs and discussed the procedure including the risks, benefits and alternatives for the proposed anesthesia with the patient or authorized representative who has indicated his/her understanding and acceptance.     Dental Advisory Given  Plan Discussed with: Anesthesiologist, CRNA and Surgeon  Anesthesia Plan Comments: (Discussed the use of chloroprocaine with the patient in detail. )       Anesthesia Quick Evaluation

## 2021-05-13 NOTE — Progress Notes (Signed)
Spoke to pharmacy about Terb  Labor Progress Note  Ruth Tran is a 30 y.o. G1P0000 at [redacted]w[redacted]d by LMP admitted for induction of labor due to Post dates. Due date 05/13/21.  Subjective: Pt reports severe pain with UCs, unable to get epidural yet.  Objective: BP 127/69    Pulse 64    Temp 97.7 F (36.5 C) (Oral)    Resp 16    Ht 5\' 5"  (1.651 m)    Wt 99.6 kg    LMP 08/01/2020 (Exact Date)    SpO2 97%    BMI 36.54 kg/m   Fetal Assessment: FHT:  FHR: 145 bpm, variability: moderate,  accelerations:  Abscent,  decelerations:  Present Lates Category/reactivity:  Category II UC:   regular, every 1-2 minutes SVE:    Dilation: 2cm  Effacement: 50%  Station:  -3  Consistency: medium  Position: posterior  Membrane status:AROM'd at 0935 with FSE Amniotic color: clear  Labs: Lab Results  Component Value Date   WBC 9.9 05/13/2021   HGB 10.1 (L) 05/13/2021   HCT 31.1 (L) 05/13/2021   MCV 80.8 05/13/2021   PLT 179 05/13/2021    Assessment / Plan: Induction of labor due to postterm 0817 SVE 2.5/20/-3 0819 25 mcg buccal and vaginal 0935 FSE placed due to difficulty tracing baby  Labor: Progressing normally Preeclampsia:   118/75 Fetal Wellbeing:  Category II resolved with position changes and terb ordered to be given Pain Control:  Labor support without medications I/D:   GBS neg, Afebrile, AROMd x 1 hr Anticipated MOD:  NSVD  Clydene Laming, CNM 05/13/2021, 4:51 PM

## 2021-05-13 NOTE — Progress Notes (Signed)
Labor Progress Note  Ruth Tran is a 30 y.o. G1P0000 at [redacted]w[redacted]d by LMP admitted for induction of labor due to Post dates. Due date 05/13/21.  Subjective: Cat II tracing  Objective: FHT Cat I until 1432 when decles were noted down to 90-100 that resolved to Cat I tracing with position changes  SVE:    Dilation: 3cm  Effacement: 50%  Station:  -3  Consistency: soft  Position: posterior  Membrane status:AROM'd at 0935 with FSE Amniotic color: clear  Assessment / Plan: Induction of labor due to postterm 0817 SVE 2.5/20/-3 0819 25 mcg buccal and vaginal 0935 FSE placed due to difficulty tracing baby 1053 Terb given 1253 SVE 3/50/-3 1255 25 mcg buccal  Labor: Progressing normally Preeclampsia:   118/75 Fetal Wellbeing:  Category II resolved with position changes  Pain Control:  Epidural I/D:   GBS neg, Afebrile, AROMd x 5 hr Anticipated MOD:  NSVD  Clydene Laming, CNM 05/13/2021, 10:52 PM

## 2021-05-13 NOTE — Anesthesia Procedure Notes (Addendum)
Epidural Patient location during procedure: OB Start time: 05/13/2021 2:23 PM End time: 05/13/2021 2:45 PM  Staffing Anesthesiologist: Iran Ouch, MD Performed: anesthesiologist   Preanesthetic Checklist Completed: patient identified, IV checked, site marked, risks and benefits discussed, surgical consent, monitors and equipment checked, pre-op evaluation and timeout performed  Epidural Patient position: sitting Prep: ChloraPrep Patient monitoring: heart rate, continuous pulse ox and blood pressure Approach: midline Location: L3-L4 Injection technique: LOR saline  Needle:  Needle type: Tuohy  Needle gauge: 18 G Needle length: 9 cm Needle insertion depth: 7 cm Catheter type: closed end Catheter size: 20 Guage Catheter at skin depth: 11 cm  Assessment Events: blood not aspirated, injection not painful and no paresthesia  Additional Notes Reason for block:procedure for pain

## 2021-05-13 NOTE — Progress Notes (Signed)
Spoke with Dr. Wynetta Emery, anesthesia. Made him aware patient is here for induction of labor and has an allergy to Lidocaine (anaphylactic). Patient plans to request an epidural for labor pain management. Made aware of anesthesia consult with Dr. Erenest Rasher and recommendation for epidural medication. Dr. Wynetta Emery is planning to go ahead and place the epidural order so pharmacy can go ahead and prepare. Dineen Kid

## 2021-05-13 NOTE — Progress Notes (Signed)
Patient ID: Ruth Tran, female   DOB: May 03, 1991, 30 y.o.   MRN: [id number] Infant APGARS 7/8

## 2021-05-13 NOTE — Progress Notes (Signed)
Labor Progress Note  Ruth Tran is a 30 y.o. G1P0000 at [redacted]w[redacted]d by LMP of 08/01/20 admitted for induction of labor due to Post dates. Due date 05/13/21.  Subjective: Doing well, no complaints  Objective: BP 118/75 (BP Location: Left Arm)    Pulse 87    Temp 97.9 F (36.6 C) (Oral)    Resp 16    Ht 5\' 5"  (1.651 m)    Wt 99.6 kg    LMP 08/01/2020 (Exact Date)    BMI 36.54 kg/m   Fetal Assessment: FHT:  FHR: 120 bpm, variability: moderate,  accelerations:  Present,  decelerations:  Absent Category/reactivity:  Category I UC:   none SVE:    Dilation: 2cm  Effacement: 20%  Station:  -3  Consistency: medium  Position: anterior  Membrane status:Intact Amniotic color: n/a  Labs: Lab Results  Component Value Date   WBC 9.9 05/13/2021   HGB 10.1 (L) 05/13/2021   HCT 31.1 (L) 05/13/2021   MCV 80.8 05/13/2021   PLT 179 05/13/2021    Assessment / Plan: Induction of labor due to postterm,  first dose of Cytotec placed now  Labor: Progressing normally Preeclampsia:   BP  118/75 Fetal Wellbeing:  Category I Pain Control:   Planning epidural, has known allergy to Lidocaine - anesthesiologist aware and ordering alternative. I/D:   GBS neg, Afebrile, Intact  Anticipated MOD:  NSVD  Clydene Laming, CNM 05/13/2021, 8:22 AM

## 2021-05-13 NOTE — Progress Notes (Signed)
Patient ID: Ruth Tran, female   DOB: [birth year], 30 y.o.   MRN: [id number] Markings noted on infant head  noted to be good placement and no lacerations

## 2021-05-14 LAB — CBC
HCT: 26 % — ABNORMAL LOW (ref 36.0–46.0)
Hemoglobin: 8.4 g/dL — ABNORMAL LOW (ref 12.0–15.0)
MCH: 26.4 pg (ref 26.0–34.0)
MCHC: 32.3 g/dL (ref 30.0–36.0)
MCV: 81.8 fL (ref 80.0–100.0)
Platelets: 144 10*3/uL — ABNORMAL LOW (ref 150–400)
RBC: 3.18 MIL/uL — ABNORMAL LOW (ref 3.87–5.11)
RDW: 13.6 % (ref 11.5–15.5)
WBC: 12.9 10*3/uL — ABNORMAL HIGH (ref 4.0–10.5)
nRBC: 0 % (ref 0.0–0.2)

## 2021-05-14 MED ORDER — SENNOSIDES-DOCUSATE SODIUM 8.6-50 MG PO TABS
1.0000 | ORAL_TABLET | Freq: Every day | ORAL | Status: DC
  Administered 2021-05-14 – 2021-05-15 (×2): 1 via ORAL
  Filled 2021-05-14 (×2): qty 1

## 2021-05-14 NOTE — Progress Notes (Signed)
Postpartum Day  1  Subjective: no complaints, up ad lib, voiding, tolerating PO, and + flatus  Doing well, no concerns. Ambulating without difficulty, pain managed with PO meds, tolerating regular diet, and voiding without difficulty.   No fever/chills, chest pain, shortness of breath, nausea/vomiting, or leg pain. No nipple or breast pain. No headache, visual changes, or RUQ/epigastric pain.  Objective: BP (!) 95/58 (BP Location: Left Arm)    Pulse 70    Temp 98 F (36.7 C) (Oral)    Resp 18    Ht 5\' 5"  (1.651 m)    Wt 99.6 kg    LMP 08/01/2020 (Exact Date)    SpO2 100%    Breastfeeding Unknown    BMI 36.54 kg/m    Physical Exam:  General: alert, cooperative, and no distress Breasts: soft/nontender CV: RRR Pulm: nl effort, CTABL Abdomen: soft, non-tender, active bowel sounds Uterine Fundus: firm Perineum: minimal edema, repair well approximated Lochia: appropriate DVT Evaluation: No evidence of DVT seen on physical exam.  Recent Labs    05/13/21 0513 05/14/21 0635  HGB 10.1* 8.4*  HCT 31.1* 26.0*  WBC 9.9 12.9*  PLT 179 144*    Assessment/Plan: 30 y.o. G1P1001 postpartum day # 1  -Continue routine postpartum care -Lactation consult PRN for breastfeeding  -Instructed to pump at least 3-4 times in a 24 hour period to help establish breast milk supply with nipple shields  -Acute blood loss anemia - hemodynamically stable and asymptomatic; start PO ferrous sulfate BID with stool softeners  -Scheduled stool softeners d/t 3rd laceration  -Sitz bath PRN -Immunization status:   all immunizations up to date   Disposition: Continue inpatient postpartum care    LOS: 1 day   Minda Meo, CNM 05/14/2021, 9:27 AM   ----- Drinda Butts  Certified Nurse Midwife [facility]

## 2021-05-14 NOTE — Op Note (Addendum)
NAMEBARRETT, HOLTHAUS MEDICAL RECORD NO: [id number] ACCOUNT NO: [id number] DATE OF BIRTH: [birth year] FACILITY: [facility] LOCATION: [facility] PHYSICIAN: Boykin Nearing, MD  Operative Report   DATE OF PROCEDURE: 05/13/2021  PREOPERATIVE DIAGNOSIS:  Prolonged fetal bradycardic episode.  POSTOPERATIVE DIAGNOSES:   1.  Prolonged fetal bradycardic episode. 2.  Vigorous female infant delivered. 3.  Third-degree perineal laceration.   PROCEDURE:   1.  Indicated low forceps vaginal delivery. 2.  Repair of total anterior and posterior anal sphincter.  INDICATIONS AND DESCRIPTION OF PROCEDURE:  A 30 year old gravida 1, para 0 patient at 40+5 weeks estimated gestational age was laboring in the second stage of labor when the fetus developed a prolonged bradycardic episode.  On prompt evaluation, the  infant's head was left occiput anterior 45 degrees off midline, station +3/3 station.  Simpson forceps were called up on the operative field and the posterior blade was placed without difficulty, followed by the anterior blade and the forceps articulated  easily.  The patient's bladder had been previously drained before this application.  Head was rotated counterclockwise 45 degrees and on the third set of pushing over a midline episiotomy, head was delivered and the forceps were removed.  Head was  delivered followed by shoulders and body without difficulty.  A somewhat limp female infant was placed on the mother's abdomen and the cord was doubly clamped and cut and nursery staff then resuscitated the baby, had signed Apgar scores of 7/8 at  .  Cord gas was performed, results are 7.03, CO2 equal 69 and bicarbonate 18.  Time of birth was 5:24 on 05/13/2021.  The placenta then was delivered approximately 10 minutes later and evaluation revealed bilateral sulcus tears  approximately 7 cm each and a total third-degree perineal laceration.  The anterior and posterior capsules were ruptured and the capsules were  then grasped with Allis clamps and 3 figure-of-eight sutures were placed to reapproximate the sphincter.  A  rectal exam was performed before the sutures were placed and after and good uterine tone.  Good rectal tone was noted.  Gloves were changed and the sulcus tears were then repaired with 2-0 Vicryl suture in a running locking fashion.  Each sulcus tear was  repaired followed by perineal crown suture and subcuticular suture.  Good cosmetic effect.  Rectal exam was performed at the end, which was normal and again good rectal tone.  Good hemostasis was noted.  Quantitative blood loss 390 mL. The patient  tolerated the procedure well.     PAA D: 05/13/2021 6:48:00 pm T: 05/14/2021 6:17:00 am  JOB: [id number]/ [id number]

## 2021-05-15 MED ORDER — SENNOSIDES-DOCUSATE SODIUM 8.6-50 MG PO TABS: 1.0000 | ORAL_TABLET | Freq: Every day | ORAL | Status: DC

## 2021-05-15 MED ORDER — FERROUS SULFATE 325 (65 FE) MG PO TABS: 325.0000 mg | ORAL_TABLET | Freq: Every day | ORAL | 3 refills | Status: DC

## 2021-05-15 NOTE — Progress Notes (Signed)
Pt. States relief from feeling "shaky" after eating.

## 2021-05-15 NOTE — Progress Notes (Signed)
Pt discharged with infant. Discharge instructions, prescriptions, and follow up appointments given to and reviewed with patient. Pt verbalized understanding. To be escorted out by auxillary.

## 2021-05-15 NOTE — Progress Notes (Signed)
Called to room by Pt. With c/o feeing "shaky". VS: 107/62, HR reg. At 78, RR unlabored at 18, Temp. Is 97.7. Pt. Given [place] Sandwich and milk. Will reassess to see if she has relief from symptom after eating. Instructed Pt. To call if symptoms worsened before reassessment and she and sig. Other v/o.

## 2021-05-15 NOTE — Discharge Instructions (Signed)
Vaginal Delivery, Care After Refer to this sheet in the next few weeks. These discharge instructions provide you with information on caring for yourself after delivery. Your caregiver may also give you specific instructions. Your treatment has been planned according to the most current medical practices available, but problems sometimes occur. Call your caregiver if you have any problems or questions after you go home. HOME CARE INSTRUCTIONS Take over-the-counter or prescription medicines only as directed by your caregiver or pharmacist. Do not drink alcohol, especially if you are breastfeeding or taking medicine to relieve pain. Do not smoke tobacco. Continue to use good perineal care. Good perineal care includes: Wiping your perineum from back to front Keeping your perineum clean. You can do sitz baths twice a day, to help keep this area clean Do not use tampons, douche or have sex until your caregiver says it is okay. Shower only and avoid sitting in submerged water, aside from sitz baths Wear a well-fitting bra that provides breast support. Eat healthy foods. Drink enough fluids to keep your urine clear or pale yellow. Eat high-fiber foods such as whole grain cereals and breads, brown rice, beans, and fresh fruits and vegetables every day. These foods may help prevent or relieve constipation. Avoid constipation with high fiber foods or medications, such as miralax or metamucil Follow your caregiver's recommendations regarding resumption of activities such as climbing stairs, driving, lifting, exercising, or traveling. Talk to your caregiver about resuming sexual activities. Resumption of sexual activities is dependent upon your risk of infection, your rate of healing, and your comfort and desire to resume sexual activity. Try to have someone help you with your household activities and your newborn for at least a few days after you leave the hospital. Rest as much as possible. Try to rest or  take a nap when your newborn is sleeping. Increase your activities gradually. Keep all of your scheduled postpartum appointments. It is very important to keep your scheduled follow-up appointments. At these appointments, your caregiver will be checking to make sure that you are healing physically and emotionally. SEEK MEDICAL CARE IF:  You are passing large clots from your vagina. Save any clots to show your caregiver. You have a foul smelling discharge from your vagina. You have trouble urinating. You are urinating frequently. You have pain when you urinate. You have a change in your bowel movements. You have increasing redness, pain, or swelling near your vaginal incision (episiotomy) or vaginal tear. You have pus draining from your episiotomy or vaginal tear. Your episiotomy or vaginal tear is separating. You have painful, hard, or reddened breasts. You have a severe headache. You have blurred vision or see spots. You feel sad or depressed. You have thoughts of hurting yourself or your newborn. You have questions about your care, the care of your newborn, or medicines. You are dizzy or light-headed. You have a rash. You have nausea or vomiting. You were breastfeeding and have not had a menstrual period within 12 weeks after you stopped breastfeeding. You are not breastfeeding and have not had a menstrual period by the 12th week after delivery. You have a fever. SEEK IMMEDIATE MEDICAL CARE IF:  You have persistent pain. You have chest pain. You have shortness of breath. You faint. You have leg pain. You have stomach pain. Your vaginal bleeding saturates two or more sanitary pads in 1 hour. MAKE SURE YOU:  Understand these instructions. Will watch your condition. Will get help right away if you are not doing well or   get worse. Document Released: 05/12/2000 Document Revised: 09/29/2013 Document Reviewed: 01/10/2012 ExitCare Patient Information 2015 [employer]. This  information is not intended to replace advice given to you by your health care provider. Make sure you discuss any questions you have with your health care provider.  Sitz Bath A sitz bath is a warm water bath taken in the sitting position. The water covers only the hips and butt (buttocks). We recommend using one that fits in the toilet, to help with ease of use and cleanliness. It may be used for either healing or cleaning purposes. Sitz baths are also used to relieve pain, itching, or muscle tightening (spasms). The water may contain medicine. Moist heat will help you heal and relax.  HOME CARE  Take 3 to 4 sitz baths a day. Fill the bathtub half-full with warm water. Sit in the water and open the drain a little. Turn on the warm water to keep the tub half-full. Keep the water running constantly. Soak in the water for 15 to 20 minutes. After the sitz bath, pat the affected area dry. GET HELP RIGHT AWAY IF: You get worse instead of better. Stop the sitz baths if you get worse. MAKE SURE YOU: Understand these instructions. Will watch your condition. Will get help right away if you are not doing well or get worse. Document Released: 06/22/2004 Document Revised: 02/07/2012 Document Reviewed: 09/12/2010 ExitCare Patient Information 2015 [employer]. This information is not intended to replace advice given to you by your health care provider. Make sure you discuss any questions you have with your health care provider.   

## 2021-05-17 NOTE — Progress Notes (Signed)
3b repair third degree laceration

## 2021-05-21 NOTE — Anesthesia Postprocedure Evaluation (Signed)
Anesthesia Post Note  Patient: Ruth Tran  Procedure(s) Performed: AN AD [place]  Patient location during evaluation: Mother Baby Anesthesia Type: Epidural Level of consciousness: awake and alert Pain management: pain level controlled Vital Signs Assessment: post-procedure vital signs reviewed and stable Respiratory status: spontaneous breathing, nonlabored ventilation and respiratory function stable Cardiovascular status: stable Postop Assessment: no headache, no backache and epidural receding Anesthetic complications: no   No notable events documented.   Last Vitals: There were no vitals filed for this visit.  Last Pain: There were no vitals filed for this visit.               [place] Ouch

## 2021-06-24 LAB — HM PAP SMEAR: HM Pap smear: NORMAL

## 2021-08-22 ENCOUNTER — Ambulatory Visit: Payer: BC Managed Care – PPO | Attending: Certified Nurse Midwife | Admitting: Physical Therapy

## 2021-08-22 ENCOUNTER — Other Ambulatory Visit: Payer: Self-pay

## 2021-08-22 DIAGNOSIS — R102 Pelvic and perineal pain: Secondary | ICD-10-CM

## 2021-08-22 DIAGNOSIS — R278 Other lack of coordination: Secondary | ICD-10-CM

## 2021-08-22 DIAGNOSIS — M62838 Other muscle spasm: Secondary | ICD-10-CM

## 2021-08-22 NOTE — Therapy (Signed)
?OUTPATIENT PHYSICAL THERAPY FEMALE PELVIC EVALUATION ? ? ?Patient Name: Ruth Tran ?MRN: [id number] ?DOB:Oct 03, 1990, 31 y.o., female ?Today's Date: 08/22/2021 ? ? PT End of Session - 08/22/21 1547   ? ? Visit Number 1   ? Number of Visits 12   ? Date for PT Re-Evaluation 11/14/21   ? Authorization Type IE 08/22/2021   ? PT Start Time 1545   ? PT Stop Time 1630   ? PT Time Calculation (min) 45 min   ? Activity Tolerance Patient tolerated treatment well   ? Behavior During Therapy [facility] for tasks assessed/performed   ? ?  ?  ? ?  ? ? ?Past Medical History:  ?Diagnosis Date  ? Allergy   ? Chicken pox   ? Complication of anesthesia   ? WITH HER WISDOM TEETH SURGERY SHE HAD TO GO TO THE OR TO HAVE THIS DONE DUE TO TACHYCARDIA DUE TO GRAVES DISEASE  ? Complication of anesthesia   ? Dysmenorrhea   ? Family history of endometriosis   ? mother,sister  ? Graves disease   ? graves, hyperthyroidism   ? Headache   ? MIGRAINES  ? Migraine   ? Ovarian cyst   ? Panic attack   ? UTI (urinary tract infection)   ? ?Past Surgical History:  ?Procedure Laterality Date  ? APPENDECTOMY  2005  ? WITH DRAIN PLACED DUE TO ABSCESS 2005 [facility]  ? BREAST BIOPSY Right 2017  ? had a reaction to xylo or xylo with epi where her throat closed up  ? CHROMOPERTUBATION Right 08/12/2018  ? Procedure: CHROMOPERTUBATION;  Surgeon: Benjaman Kindler, MD;  Location: [facility];  Service: Gynecology;  Laterality: Right;  ? FOOT SURGERY Bilateral 2008  ? toes going outward surgery to turn toes inward 2010   ? LAPAROSCOPIC OVARIAN CYSTECTOMY Right 08/12/2018  ? Procedure: LAPAROSCOPIC OVARIAN CYSTECTOMY;  Surgeon: Benjaman Kindler, MD;  Location: [facility];  Service: Gynecology;  Laterality: Right;  ? LYSIS OF ADHESION Right 08/12/2018  ? Procedure: LYSIS OF ADHESIONS Lysis of Omental Adhesions, right cecum adhesions, right fallopian tube and right adnexal adhesions;  Surgeon: Benjaman Kindler, MD;  Location: [facility];  Service: Gynecology;  Laterality: Right;  ?  TONSILLECTOMY AND ADENOIDECTOMY N/A 04/17/2017  ? Procedure: TONSILLECTOMY AND POSSIBLE  ADENOIDECTOMY;  Surgeon: Beverly Gust, MD;  Location: [facility];  Service: ENT;  Laterality: N/A;  ? WISDOM TOOTH EXTRACTION    ? 2016  ? ?Patient Active Problem List  ? Diagnosis Date Noted  ? Forceps delivery 05/13/2021  ? Third degree laceration of perineum during delivery, postpartum 05/13/2021  ? Encounter for supervision of normal pregnancy 09/30/2020  ? Patellofemoral syndrome of right knee 12/19/2019  ? Chronic pain of right knee 09/12/2019  ? Allergic rhinitis 09/12/2019  ? Ovarian cyst 10/22/2018  ? Migraine without aura and without status migrainosus, not intractable 09/19/2017  ? Hyperthyroidism 09/19/2017  ? Seasonal allergies 09/19/2017  ? Graves disease 01/22/2014  ? Vitamin D deficiency disease 01/22/2014  ? ? ?PCP: McLean-Scocuzza, [name], MD ? ?REFERRING PROVIDER: Gertie Fey,* ? ?REFERRING DIAG: R10.2 (ICD-10-CM) - Pelvic and perineal pain ? ?THERAPY DIAG:  ?Pelvic pain ? ?Other lack of coordination ? ?Other muscle spasm ? ?ONSET DATE: 05/13/2021 ? ?SUBJECTIVE:                                                                                                                                                                                          ? ?  Chief complaint: Patient states that pain started in early February. Patient notes that she cannot sit, drive, stand, bend, squat for any significant duration without high pain intensity. Patient notes that the sensation is like intense hot burning. She was told by OB/GYN that she had scar tissue at the perineum and has been performing scar massage. Patient was recently sexually active with partner and had intense pain for 2 days after.  ? ? ?Pain: ?Are you having pain? Yes ?NPRS scale: 4/10 ?Pain location:  vaginal opening ?Least pain: 2/10 (resting/sleeping) ?Worst pain: 9/10 (prolonged standing) ?Pain type: burning ?Pain description: constant   ? ?Aggravating factors: prolonged activity ?Relieving factors: rest ? ?Precautions: None ? ?Falls: Has patient fallen in last 6 months? No ? ? ?Occupation/Current Activities: caregiving; walking;  ? ?PLOF: Independent ? ?Patient stated goals: Be able to walk around [facility] without pain limiting;  ? ?Pertinent History: ?Scoliosis Negative. ?Pulmonary disease/dysfunction Negative. ?Surgical history: Positive for see above. ? ?Obstetrical History: ?G1P1 ?Deliveries: SVD, forceps. Epidural.  ?Tearing/Episiotomy: grade 3 ? ?Gynecological History: ?Hysterectomy: No Vaginal/Abdominal ?Endometriosis: Negative ?Pelvic Organ Prolapse: Negative ?Last Menstrual Period: 08/17/2021-08/21/2021 ?Pain with exam: Yes; at 6 week post-partum f/u ?Heaviness/pressure: Yes (pressure) ? ?Urinary History: ?Frequency of urination: every 2-3 hours ?Incontinence: Urge to void, Walking to the bathroom. 25% of voids ? Onset: 05/13/2021; Amount: Min/Mod. ?Protective undergarments: Yes   ?Type: mini pads; Number used/day: 2x ?Fluid Intake: 64 oz+ H20, 8 oz coffee caffeinated, rare sodas ?Nocturia: 2x/night ?Complete emptying: Yes ?Pain with urination: Negative ?Stream: Strong ?Urgency: Yes: 25% ?Difficulty initiating urination: Negative ?Intermittent stream: Negative ?Frequent UTI: Negative. ? ?Gastrointestinal History: ?Type of bowel movement:Type (Bristol Stool Scale) 1-6 ?Frequency of BMs: 1-2x/week ?Complete bowel movement: Yes ?Pain with defecation: Negative ?Straining with defecation: Positive for 75% of BMs ?Hemorrhoids: Negative ?Toileting posture: feet flat ?Fiber supplement: No ?Incontinence: Negative.  ? ? ?Sexual History: ?Pain with penetration: After Intercourse ?Pain with external stimulation: Yes: tender during; painful afterwards ?Change in ability to achieve orgasm: Yes: increased time ?Sexual abuse: No ? ? ?OBJECTIVE:  ? ?PATIENT SURVEYS:  ?FOTO PFDI Pain 58 ? ?COGNITION: ?Overall cognitive status:  Within functional limits for tasks assessed   ?  ?POSTURE/OBSERVATIONS:  ?Lumbar lordosis: mildly diminished with resting posterior pelvic tilt ?Thoracic kyphosis: mildly increased ?Iliac crest height: not formally assessed ?Lumbar lateral shift: not formally assessed ?Pelvic obliquity: not formally assessed ?Leg length discrepancy: not formally assessed ? ?GAIT: ?Grossly WFL.  ?Assistive device utilized: None ?Level of assistance: Complete Independence ?Trendelenburg R: Negative L: Negative ? ?RANGE OF MOTION: deferred 2/2 to time constraints ?  LEFT RIGHT  ?Lumbar forward flexion (65):      ?Lumbar extension (30):     ?Lumbar lateral flexion (25):     ?Thoracic and Lumbar rotation (30 degrees):       ?Hip Flexion (0-125):      ?Hip IR (0-45):     ?Hip ER (0-45):     ?Hip Abduction (0-40):     ?Hip extension (0-15):     ? ?SENSATION: ?deferred 2/2 to time constraints ? ?STRENGTH: MMT deferred 2/2 to time constraints ? RLE LLE  ?Hip Flexion    ?Hip Extension    ?Hip Abduction     ?Hip Adduction     ?Hip ER     ?Hip IR     ?Knee Extension    ?Knee Flexion    ?Dorsiflexion     ?Plantarflexion (seated)    ? ?ABDOMINAL: deferred 2/2 to  time constraints ?Palpation: ?Diastasis: ?Scar mobility: ?Rib flare: ? ?SPECIAL TESTS: deferred 2/2 to time constraints ? ? ?PHYSICAL PERFORMANCE MEASURES: ?STS: WFL ?Deep Squat: ?RLE STS: ?LLE STS:  ?6MWT: ?5TSTS:   ? ?EXTERNAL PELVIC EXAM: deferred 2/2 to time constraints ?Breath coordination: ?Voluntary Contraction: present/absent ?Relaxation: full/delayed/non-relaxing ?Perineal movement with sustained IAP increase ("bear down"): descent/no change/elevation/excessive descent ?Perineal movement with rapid IAP increase ("cough"): elevation/no change/descent ? ?INTERNAL VAGINAL EXAM: deferred 2/2 to time constraints ?Introitus Appears:  ?Skin integrity:  ?Scar mobility: ?Strength (PERF):  ?Symmetry: ?Palpation: ?Prolapse: ? ?INTERNAL RECTAL EXAM: not indicated ?Strength  (PERF): ?Symmetry: ?Palpation: ?Prolapse: ? ? ?PATIENT EDUCATION:  ?Patient educated on prognosis, POC, and provided with HEP including: [id number]. Patient articulated understanding and returned demonstration. Patient will benefit from f

## 2021-08-29 ENCOUNTER — Encounter: Payer: Self-pay | Admitting: Physical Therapy

## 2021-08-29 ENCOUNTER — Ambulatory Visit: Payer: BC Managed Care – PPO | Attending: Certified Nurse Midwife | Admitting: Physical Therapy

## 2021-08-29 DIAGNOSIS — R278 Other lack of coordination: Secondary | ICD-10-CM | POA: Diagnosis present

## 2021-08-29 DIAGNOSIS — M62838 Other muscle spasm: Secondary | ICD-10-CM | POA: Diagnosis present

## 2021-08-29 DIAGNOSIS — R102 Pelvic and perineal pain: Secondary | ICD-10-CM

## 2021-08-29 NOTE — Therapy (Signed)
?OUTPATIENT PHYSICAL THERAPY TREATMENT NOTE ? ? ?Patient Name: Ruth Tran ?MRN: [id number] ?DOB:[birth year], 31 y.o., female ?Today's Date: 08/29/2021 ? ?PCP: McLean-Scocuzza, [name], MD ?REFERRING PROVIDER: Gertie Fey,* ? ? PT End of Session - 08/29/21 1557   ? ? Visit Number 2   ? Number of Visits 12   ? Date for PT Re-Evaluation 11/14/21   ? Authorization Type IE 08/22/2021   ? PT Start Time 7893   ? PT Stop Time 8101   ? PT Time Calculation (min) 40 min   ? Activity Tolerance Patient tolerated treatment well   ? Behavior During Therapy [facility] for tasks assessed/performed   ? ?  ?  ? ?  ? ? ?Past Medical History:  ?Diagnosis Date  ? Allergy   ? Chicken pox   ? Complication of anesthesia   ? WITH HER WISDOM TEETH SURGERY SHE HAD TO GO TO THE OR TO HAVE THIS DONE DUE TO TACHYCARDIA DUE TO GRAVES DISEASE  ? Complication of anesthesia   ? Dysmenorrhea   ? Family history of endometriosis   ? mother,sister  ? Graves disease   ? graves, hyperthyroidism   ? Headache   ? MIGRAINES  ? Migraine   ? Ovarian cyst   ? Panic attack   ? UTI (urinary tract infection)   ? ?Past Surgical History:  ?Procedure Laterality Date  ? APPENDECTOMY  2005  ? WITH DRAIN PLACED DUE TO ABSCESS 2005 [facility]  ? BREAST BIOPSY Right 2017  ? had a reaction to xylo or xylo with epi where her throat closed up  ? CHROMOPERTUBATION Right 08/12/2018  ? Procedure: CHROMOPERTUBATION;  Surgeon: Benjaman Kindler, MD;  Location: [facility] ORS;  Service: Gynecology;  Laterality: Right;  ? FOOT SURGERY Bilateral 2008  ? toes going outward surgery to turn toes inward 2010   ? LAPAROSCOPIC OVARIAN CYSTECTOMY Right 08/12/2018  ? Procedure: LAPAROSCOPIC OVARIAN CYSTECTOMY;  Surgeon: Benjaman Kindler, MD;  Location: [facility];  Service: Gynecology;  Laterality: Right;  ? LYSIS OF ADHESION Right 08/12/2018  ? Procedure: LYSIS OF ADHESIONS Lysis of Omental Adhesions, right cecum adhesions, right fallopian tube and right adnexal adhesions;  Surgeon: Benjaman Kindler, MD;   Location: [facility] OR[facility];  Service: Gynecology;  Laterality: Right;  ? TONSILLECTOMY AND ADENOIDECTOMY N/A 04/17/2017  ? Procedure: TONSILLECTOMY AND POSSIBLE  ADENOIDECTOMY;  Surgeon: Beverly Gust, MD;  Location: [facility] ORS;  Service: ENT;  Laterality: N/A;  ? WISDOM TOOTH EXTRACTION    ? 2016  ? ?Patient Active Problem List  ? Diagnosis Date Noted  ? Forceps delivery 05/13/2021  ? Third degree laceration of perineum during delivery, postpartum 05/13/2021  ? Encounter for supervision of normal pregnancy 09/30/2020  ? Patellofemoral syndrome of right knee 12/19/2019  ? Chronic pain of right knee 09/12/2019  ? Allergic rhinitis 09/12/2019  ? Ovarian cyst 10/22/2018  ? Migraine without aura and without status migrainosus, not intractable 09/19/2017  ? Hyperthyroidism 09/19/2017  ? Seasonal allergies 09/19/2017  ? Graves disease 01/22/2014  ? Vitamin D deficiency disease 01/22/2014  ? ? ?REFERRING DIAG: R10.2 (ICD-10-CM) - Pelvic and perineal pain ? ?THERAPY DIAG:  ?Pelvic pain ? ?Other lack of coordination ? ?Other muscle spasm ? ?PERTINENT HISTORY: Third degree perineal tear, 04/2021 ? ?PRECAUTIONS: None ? ?SUBJECTIVE: Patient has been doing stretches and had increased pain with happy baby stretch. Patient played games yesterday and sitting in a hard chair she felt tender throughout the base of the pelvis.  ? ?PAIN:  ?Are you  having pain? Yes: NPRS scale: 2/10 ?Pain location: pelvis ?Pain description: burning, heaviness ? ?TREATMENT ? ?Pre-treatment assessment: Adam's Test, R rotation ?RANGE OF MOTION:  ?  LEFT RIGHT  ?Lumbar forward flexion (65):  WNL    ?Lumbar extension (30): WNL    ?Lumbar lateral flexion (25):  WNL WNL  ?Thoracic and Lumbar rotation (30 degrees):    WNL WNL  ?Hip Flexion (0-125):      ?Hip IR (0-45):     ?Hip ER (0-45):     ?Hip Abduction (0-40):     ?Hip extension (0-15):     ? ? ? ?SENSATION: ?Grossly intact to light touch bilateral LEs as determined by testing dermatomes L2-S2 ?Proprioception and  hot/cold testing deferred on this date ? ?STRENGTH: MMT  ? RLE LLE  ?Hip Flexion 4 4  ?Hip Abduction  5 5  ?Hip Adduction  5 5  ?Hip ER  4 3+  ?Hip IR  5 5  ?Knee Extension 5 5  ?Knee Flexion 5 5  ?Dorsiflexion  5 5  ?Plantarflexion (seated) 5 5  ? ?ABDOMINAL:  ?Palpation: no TTP ?Diastasis: none, < 1 finger width throughout ?Scar mobility: n/a ? ?EXTERNAL PELVIC EXAM: Patient educated on the purpose of the pelvic exam and articulated understanding; patient consented to the exam verbally. ?Breath coordination: present, minimally  ?Cued Lengthen: no perineal descent ?Cued Contraction: palpable squeeze, no lift; significant abdominal contributions ?Cued relaxation: delayed ?Cough: perineal ascent ? ?Manual Therapy: ? ? ?Neuromuscular Re-education: ?Supine hooklying diaphragmatic breathing with VCs and TCs for downregulation of the nervous system and improved management of IAP ?Supine hooklying, PFM lengthening with inhalation. VCs and TCs to decrease compensatory patterns and encourage optimal relaxation of the PFM. ?Patient education on wall stretches for PFM lengthening including: hamstring, adductor, figure 4. Provided with handouts for HEP. ?Patient education on self-exploration prior to internal assessment next visit; given "Exploring Your Genitals" handout for HEP. ? ?Therapeutic Exercise: ? ? ?Treatments unbilled: ? ?Post-treatment assessment: ? ?Patient educated throughout session on appropriate technique and form using multi-modal cueing, HEP, and activity modification. Patient articulated understanding and returned demonstration. ? ?Patient Response to interventions: ?Denies any concerns ? ?ASSESSMENT ?Patient presents to clinic with excellent motivation to participate in therapy. Patient demonstrates deficits in posture, PFM coordination, and pain. Patient able to achieve PFM lengthened position after contraction during today's session and responded positively to educational interventions. Patient will  benefit from continued skilled therapeutic intervention to address remaining deficits in posture, PFM coordination, and pain in order to increase function and improve overall QOL. ? ? ?Objective impairments: decreased activity tolerance, decreased coordination, decreased strength, hypomobility, increased fascial restrictions, increased muscle spasms, impaired flexibility, improper body mechanics, postural dysfunction, and pain.  ? ?Activity limitations: community activity, driving, meal prep, occupation, and shopping.  ? ?Personal factors: Behavior pattern, Past/current experiences, and 3+ comorbidities: postpartum depression, hyperthyroidism, migraines, Vit D deficiency,  are also affecting patient's functional outcome.  ? ?Rehab Potential: Good ? ?Clinical decision making: Evolving/moderate complexity ? ?Evaluation complexity: Moderate ? ? ?GOALS: ?Goals reviewed with patient? Yes ? ?SHORT TERM GOALS: Target date: 10/03/2021 ? ?Patient will demonstrate independence with HEP in order to maximize therapeutic gains and improve carryover from physical therapy sessions to ADLs in the home and community. ?Baseline: not formally assessed ?Goal status: INITIAL ? ?Patient will demonstrate independent and coordinated diaphragmatic breathing in supine with a 1:2 breathing pattern for improved down-regulation of the nervous system and improved management of intra-abdominal pressures in order to increase  function at home and in the community. ?Baseline: not formally assessed ?Goal status: INITIAL ? ? ?LONG TERM GOALS: Target date: 11/14/2021 ? ?Patient will demonstrate improved function as evidenced by a score of <25 on FOTO measure for full participation in activities at home and in the community. ?Baseline: 58 ?Goal status: INITIAL ? ?Patient will decrease worst pain as reported on NPRS by at least 2 points to demonstrate clinically significant reduction in pain in order to restore/improve function and overall QOL. ?Baseline:  9/10 ?Goal status: INITIAL ? ?Patient will demonstrate circumferential and sequential contraction of >4/5 MMT, > 6 sec hold x10 and 5 consecutive quick flicks with </= 10 min rest between testing bouts,

## 2021-09-05 ENCOUNTER — Encounter: Payer: Self-pay | Admitting: Physical Therapy

## 2021-09-05 ENCOUNTER — Ambulatory Visit: Payer: BC Managed Care – PPO | Admitting: Physical Therapy

## 2021-09-05 DIAGNOSIS — R102 Pelvic and perineal pain unspecified side: Secondary | ICD-10-CM

## 2021-09-05 DIAGNOSIS — R278 Other lack of coordination: Secondary | ICD-10-CM

## 2021-09-05 DIAGNOSIS — M62838 Other muscle spasm: Secondary | ICD-10-CM

## 2021-09-05 NOTE — Therapy (Signed)
?OUTPATIENT PHYSICAL THERAPY TREATMENT NOTE ? ? ?Patient Name: Ruth Tran ?MRN: [id number] ?DOB:Feb 21, 1991, 31 y.o., female ?Today's Date: 09/05/2021 ? ?PCP: McLean-Scocuzza, [name], MD ?REFERRING PROVIDER: Gertie Fey,* ? ? PT End of Session - 09/05/21 1552   ? ? Visit Number 3   ? Number of Visits 12   ? Date for PT Re-Evaluation 11/14/21   ? Authorization Type IE 08/22/2021   ? PT Start Time 1545   ? PT Stop Time 1630   ? PT Time Calculation (min) 45 min   ? Activity Tolerance Patient tolerated treatment well   ? Behavior During Therapy [facility] for tasks assessed/performed   ? ?  ?  ? ?  ? ? ?Past Medical History:  ?Diagnosis Date  ? Allergy   ? Chicken pox   ? Complication of anesthesia   ? WITH HER WISDOM TEETH SURGERY SHE HAD TO GO TO THE OR TO HAVE THIS DONE DUE TO TACHYCARDIA DUE TO GRAVES DISEASE  ? Complication of anesthesia   ? Dysmenorrhea   ? Family history of endometriosis   ? mother,sister  ? Graves disease   ? graves, hyperthyroidism   ? Headache   ? MIGRAINES  ? Migraine   ? Ovarian cyst   ? Panic attack   ? UTI (urinary tract infection)   ? ?Past Surgical History:  ?Procedure Laterality Date  ? APPENDECTOMY  2005  ? WITH DRAIN PLACED DUE TO ABSCESS 2005 [facility]  ? BREAST BIOPSY Right 2017  ? had a reaction to xylo or xylo with epi where her throat closed up  ? CHROMOPERTUBATION Right 08/12/2018  ? Procedure: CHROMOPERTUBATION;  Surgeon: Benjaman Kindler, MD;  Location: [facility];  Service: Gynecology;  Laterality: Right;  ? FOOT SURGERY Bilateral 2008  ? toes going outward surgery to turn toes inward 2010   ? LAPAROSCOPIC OVARIAN CYSTECTOMY Right 08/12/2018  ? Procedure: LAPAROSCOPIC OVARIAN CYSTECTOMY;  Surgeon: Benjaman Kindler, MD;  Location: [facility];  Service: Gynecology;  Laterality: Right;  ? LYSIS OF ADHESION Right 08/12/2018  ? Procedure: LYSIS OF ADHESIONS Lysis of Omental Adhesions, right cecum adhesions, right fallopian tube and right adnexal adhesions;  Surgeon: Benjaman Kindler, MD;   Location: [facility];  Service: Gynecology;  Laterality: Right;  ? TONSILLECTOMY AND ADENOIDECTOMY N/A 04/17/2017  ? Procedure: TONSILLECTOMY AND POSSIBLE  ADENOIDECTOMY;  Surgeon: Beverly Gust, MD;  Location: [facility];  Service: ENT;  Laterality: N/A;  ? WISDOM TOOTH EXTRACTION    ? 2016  ? ?Patient Active Problem List  ? Diagnosis Date Noted  ? Forceps delivery 05/13/2021  ? Third degree laceration of perineum during delivery, postpartum 05/13/2021  ? Encounter for supervision of normal pregnancy 09/30/2020  ? Patellofemoral syndrome of right knee 12/19/2019  ? Chronic pain of right knee 09/12/2019  ? Allergic rhinitis 09/12/2019  ? Ovarian cyst 10/22/2018  ? Migraine without aura and without status migrainosus, not intractable 09/19/2017  ? Hyperthyroidism 09/19/2017  ? Seasonal allergies 09/19/2017  ? Graves disease 01/22/2014  ? Vitamin D deficiency disease 01/22/2014  ? ? ?REFERRING DIAG: R10.2 (ICD-10-CM) - Pelvic and perineal pain ? ?THERAPY DIAG:  ?Pelvic pain ? ?Other lack of coordination ? ?Other muscle spasm ? ?PERTINENT HISTORY: Third degree perineal tear, 04/2021 ? ?PRECAUTIONS: None ? ?SUBJECTIVE: Patient notes pain has been okay, but is still present. Patient has increased discomfort with B hip abduction. Patient notes that with self-exploration she noted significantly increased pain on L.  ? ?PAIN:  ?Are you having pain?  Yes: NPRS scale: 2/10 ?Pain location: pelvis ?Pain description: burning, heaviness ? ?TREATMENT ? ?Pre-treatment assessment:  ?INTERNAL VAGINAL EXAM: Patient educated on the purpose of the pelvic exam and articulated understanding; patient consented to the exam verbally. ?Introitus Appears: WNL ?Skin integrity: no apparent erythema, edema, or broken skin ?Scar mobility: significantly restricted on L from 3 o'clock to 6 o'clock ?Strength (PERF): deferred ?Symmetry: increased perineal tone B, bulbocavernosus R more tense than L ?Palpation: TTP of scar, + Tinel's test on L at  pudendal nerve ?Prolapse: not formally assessed  ?(0 no contraction, 1 flicker, 2 weak squeeze and no lift, 3 fair squeeze and definite lift, 4 good squeeze and lift against resistance, 5 strong squeeze against strong resistance) ? ?Manual Therapy: ?Scar mobilization at L vaginal scar site to allow for improved mobility and function ? ?Neuromuscular Re-education: ?Supine hooklying diaphragmatic breathing with VCs and TCs for downregulation of the nervous system and improved management of IAP ?Pudendal nerve glide in squatting position for improved pudendal nerve mobility and decreased sensitization ? ?Therapeutic Exercise: ? ? ?Treatments unbilled: ? ?Post-treatment assessment: ? ?Patient educated throughout session on appropriate technique and form using multi-modal cueing, HEP, and activity modification. Patient articulated understanding and returned demonstration. ? ?Patient Response to interventions: ?5/10 pain ? ?ASSESSMENT ?Patient presents to clinic with excellent motivation to participate in therapy. Patient demonstrates deficits in posture, PFM coordination, and pain. Patient had positive Tinel's sign at L pudendal nerve with compression at Alcock's canal, but responded well to superior and lateral glide of scar tissue at L vaginal wall. Patient will benefit from continued skilled therapeutic intervention to address remaining deficits in posture, PFM coordination, and pain in order to increase function and improve overall QOL. ? ? ?Objective impairments: decreased activity tolerance, decreased coordination, decreased strength, hypomobility, increased fascial restrictions, increased muscle spasms, impaired flexibility, improper body mechanics, postural dysfunction, and pain.  ? ?Activity limitations: community activity, driving, meal prep, occupation, and shopping.  ? ?Personal factors: Behavior pattern, Past/current experiences, and 3+ comorbidities: postpartum depression, hyperthyroidism, migraines, Vit D  deficiency,  are also affecting patient's functional outcome.  ? ?Rehab Potential: Good ? ?Clinical decision making: Evolving/moderate complexity ? ?Evaluation complexity: Moderate ? ? ?GOALS: ?Goals reviewed with patient? Yes ? ?SHORT TERM GOALS: Target date: 10/03/2021 ? ?Patient will demonstrate independence with HEP in order to maximize therapeutic gains and improve carryover from physical therapy sessions to ADLs in the home and community. ?Baseline: not formally assessed ?Goal status: INITIAL ? ?Patient will demonstrate independent and coordinated diaphragmatic breathing in supine with a 1:2 breathing pattern for improved down-regulation of the nervous system and improved management of intra-abdominal pressures in order to increase function at home and in the community. ?Baseline: not formally assessed ?Goal status: INITIAL ? ? ?LONG TERM GOALS: Target date: 11/14/2021 ? ?Patient will demonstrate improved function as evidenced by a score of <25 on FOTO measure for full participation in activities at home and in the community. ?Baseline: 58 ?Goal status: INITIAL ? ?Patient will decrease worst pain as reported on NPRS by at least 2 points to demonstrate clinically significant reduction in pain in order to restore/improve function and overall QOL. ?Baseline: 9/10 ?Goal status: INITIAL ? ?Patient will demonstrate circumferential and sequential contraction of >4/5 MMT, > 6 sec hold x10 and 5 consecutive quick flicks with </= 10 min rest between testing bouts, and relaxation of the PFM coordinated with breath for improved management of intra-abdominal pressure and normal bowel and bladder function without the presence of pain  nor incontinence in order to improve participation at home and in the community. ?Baseline: not formally assessed ?Goal status: INITIAL ? ? ? ?PLAN: ?Rehab frequency: 1x/week ? ?Rehab duration: 12 weeks ? ?Planned interventions: Therapeutic exercises, Therapeutic activity, Neuromuscular  re-education, Balance training, Gait training, Patient/Family education, Joint mobilization, Orthotic/Fit training, Electrical stimulation, Spinal manipulation, Spinal mobilization, Cryotherapy, scar mobilization, T

## 2021-09-12 ENCOUNTER — Encounter: Payer: Self-pay | Admitting: Physical Therapy

## 2021-09-12 ENCOUNTER — Ambulatory Visit: Payer: BC Managed Care – PPO | Admitting: Physical Therapy

## 2021-09-12 DIAGNOSIS — R102 Pelvic and perineal pain: Secondary | ICD-10-CM

## 2021-09-12 DIAGNOSIS — M62838 Other muscle spasm: Secondary | ICD-10-CM

## 2021-09-12 DIAGNOSIS — R278 Other lack of coordination: Secondary | ICD-10-CM

## 2021-09-12 NOTE — Therapy (Signed)
?OUTPATIENT PHYSICAL THERAPY TREATMENT NOTE ? ? ?Patient Name: Ruth Tran ?MRN: [id number] ?DOB:1990-11-28, 31 y.o., female ?Today's Date: 09/12/2021 ? ?PCP: McLean-Scocuzza, [name], MD ?REFERRING PROVIDER: Gertie Fey,* ? ? PT End of Session - 09/12/21 1555   ? ? Visit Number 4   ? Number of Visits 12   ? Date for PT Re-Evaluation 11/14/21   ? Authorization Type IE 08/22/2021   ? PT Start Time 1550   ? PT Stop Time 1630   ? PT Time Calculation (min) 40 min   ? Activity Tolerance Patient tolerated treatment well   ? Behavior During Therapy [facility] for tasks assessed/performed   ? ?  ?  ? ?  ? ? ?Past Medical History:  ?Diagnosis Date  ? Allergy   ? Chicken pox   ? Complication of anesthesia   ? WITH HER WISDOM TEETH SURGERY SHE HAD TO GO TO THE OR TO HAVE THIS DONE DUE TO TACHYCARDIA DUE TO GRAVES DISEASE  ? Complication of anesthesia   ? Dysmenorrhea   ? Family history of endometriosis   ? mother,sister  ? Graves disease   ? graves, hyperthyroidism   ? Headache   ? MIGRAINES  ? Migraine   ? Ovarian cyst   ? Panic attack   ? UTI (urinary tract infection)   ? ?Past Surgical History:  ?Procedure Laterality Date  ? APPENDECTOMY  2005  ? WITH DRAIN PLACED DUE TO ABSCESS 2005 [facility]  ? BREAST BIOPSY Right 2017  ? had a reaction to xylo or xylo with epi where her throat closed up  ? CHROMOPERTUBATION Right 08/12/2018  ? Procedure: CHROMOPERTUBATION;  Surgeon: Benjaman Kindler, MD;  Location: [facility];  Service: Gynecology;  Laterality: Right;  ? FOOT SURGERY Bilateral 2008  ? toes going outward surgery to turn toes inward 2010   ? LAPAROSCOPIC OVARIAN CYSTECTOMY Right 08/12/2018  ? Procedure: LAPAROSCOPIC OVARIAN CYSTECTOMY;  Surgeon: Benjaman Kindler, MD;  Location: [facility];  Service: Gynecology;  Laterality: Right;  ? LYSIS OF ADHESION Right 08/12/2018  ? Procedure: LYSIS OF ADHESIONS Lysis of Omental Adhesions, right cecum adhesions, right fallopian tube and right adnexal adhesions;  Surgeon: Benjaman Kindler, MD;   Location: [facility];  Service: Gynecology;  Laterality: Right;  ? TONSILLECTOMY AND ADENOIDECTOMY N/A 04/17/2017  ? Procedure: TONSILLECTOMY AND POSSIBLE  ADENOIDECTOMY;  Surgeon: Beverly Gust, MD;  Location: [facility];  Service: ENT;  Laterality: N/A;  ? WISDOM TOOTH EXTRACTION    ? 2016  ? ?Patient Active Problem List  ? Diagnosis Date Noted  ? Forceps delivery 05/13/2021  ? Third degree laceration of perineum during delivery, postpartum 05/13/2021  ? Encounter for supervision of normal pregnancy 09/30/2020  ? Patellofemoral syndrome of right knee 12/19/2019  ? Chronic pain of right knee 09/12/2019  ? Allergic rhinitis 09/12/2019  ? Ovarian cyst 10/22/2018  ? Migraine without aura and without status migrainosus, not intractable 09/19/2017  ? Hyperthyroidism 09/19/2017  ? Seasonal allergies 09/19/2017  ? Graves disease 01/22/2014  ? Vitamin D deficiency disease 01/22/2014  ? ? ?REFERRING DIAG: R10.2 (ICD-10-CM) - Pelvic and perineal pain ? ?THERAPY DIAG:  ?Pelvic pain ? ?Other lack of coordination ? ?Other muscle spasm ? ?PERTINENT HISTORY: Third degree perineal tear, 04/2021 ? ?PRECAUTIONS: None ? ?SUBJECTIVE: Patient has been doing stretches and scar mobilization daily. Patient did have one occurrence of intense pain upon waking at the pelvis. Patient described pain as burning hot poker (8.5/10) which lasted for about 10 minutes. Patient was  not wearing any constricting clothing, did not have any variation from normal activity pattern, and had no significant variation to sleeping position. Patient was tender the rest of the day.  ? ?PAIN:  ?Are you having pain? Yes: NPRS scale: 4/10 ?Pain location: pelvis ?Pain description: burning, heaviness ? ?TREATMENT ? ?09/05/2021: ?INTERNAL VAGINAL EXAM: Patient educated on the purpose of the pelvic exam and articulated understanding; patient consented to the exam verbally. ?Introitus Appears: WNL ?Skin integrity: no apparent erythema, edema, or broken skin ?Scar  mobility: significantly restricted on L from 3 o'clock to 6 o'clock ?Strength (PERF): deferred ?Symmetry: increased perineal tone B, bulbocavernosus R more tense than L ?Palpation: TTP of scar, + Tinel's test on L at pudendal nerve ?Prolapse: not formally assessed  ?(0 no contraction, 1 flicker, 2 weak squeeze and no lift, 3 fair squeeze and definite lift, 4 good squeeze and lift against resistance, 5 strong squeeze against strong resistance) ? ?Manual Therapy: ?STM and TPR performed to B adductor complexes to allow for decreased tension and pain and improved posture and function ? ? ?Neuromuscular Re-education: ?Supine hooklying diaphragmatic breathing with VCs and TCs for downregulation of the nervous system and improved management of IAP ?Supine figure 4 with PFM lengthening, BLE, for improved PFM spasm release ?Supine PT assisted hip abduction with PFM lengthening for improved PFM spasm release ?Supine butterfly with PFM lengthening, BLE, for improved PFM tissue length ? ? ?Therapeutic Exercise: ? ? ?Treatments unbilled: ? ?Post-treatment assessment: ? ?Patient educated throughout session on appropriate technique and form using multi-modal cueing, HEP, and activity modification. Patient articulated understanding and returned demonstration. ? ?Patient Response to interventions: ?5/10 pain ? ?ASSESSMENT ?Patient presents to clinic with excellent motivation to participate in therapy. Patient demonstrates deficits in posture, PFM coordination, and pain. Patient had increased adductor fasciculations and pain/spasm with gentle stretches but responded well manual interventions during today's session. Patient will benefit from continued skilled therapeutic intervention to address remaining deficits in posture, PFM coordination, and pain in order to increase function and improve overall QOL. ? ? ?Objective impairments: decreased activity tolerance, decreased coordination, decreased strength, hypomobility, increased  fascial restrictions, increased muscle spasms, impaired flexibility, improper body mechanics, postural dysfunction, and pain.  ? ?Activity limitations: community activity, driving, meal prep, occupation, and shopping.  ? ?Personal factors: Behavior pattern, Past/current experiences, and 3+ comorbidities: postpartum depression, hyperthyroidism, migraines, Vit D deficiency,  are also affecting patient's functional outcome.  ? ?Rehab Potential: Good ? ?Clinical decision making: Evolving/moderate complexity ? ?Evaluation complexity: Moderate ? ? ?GOALS: ?Goals reviewed with patient? Yes ? ?SHORT TERM GOALS: Target date: 10/03/2021 ? ?Patient will demonstrate independence with HEP in order to maximize therapeutic gains and improve carryover from physical therapy sessions to ADLs in the home and community. ?Baseline: not formally assessed ?Goal status: INITIAL ? ?Patient will demonstrate independent and coordinated diaphragmatic breathing in supine with a 1:2 breathing pattern for improved down-regulation of the nervous system and improved management of intra-abdominal pressures in order to increase function at home and in the community. ?Baseline: not formally assessed ?Goal status: INITIAL ? ? ?LONG TERM GOALS: Target date: 11/14/2021 ? ?Patient will demonstrate improved function as evidenced by a score of <25 on FOTO measure for full participation in activities at home and in the community. ?Baseline: 58 ?Goal status: INITIAL ? ?Patient will decrease worst pain as reported on NPRS by at least 2 points to demonstrate clinically significant reduction in pain in order to restore/improve function and overall QOL. ?Baseline: 9/10 ?Goal status:  INITIAL ? ?Patient will demonstrate circumferential and sequential contraction of >4/5 MMT, > 6 sec hold x10 and 5 consecutive quick flicks with </= 10 min rest between testing bouts, and relaxation of the PFM coordinated with breath for improved management of intra-abdominal pressure  and normal bowel and bladder function without the presence of pain nor incontinence in order to improve participation at home and in the community. ?Baseline: not formally assessed ?Goal status: INITIAL ? ? ? ?PLAN

## 2021-09-19 ENCOUNTER — Encounter: Payer: BC Managed Care – PPO | Admitting: Physical Therapy

## 2021-09-21 ENCOUNTER — Ambulatory Visit: Payer: BC Managed Care – PPO | Admitting: Physical Therapy

## 2021-09-21 ENCOUNTER — Encounter: Payer: Self-pay | Admitting: Physical Therapy

## 2021-09-21 DIAGNOSIS — R102 Pelvic and perineal pain: Secondary | ICD-10-CM

## 2021-09-21 DIAGNOSIS — R278 Other lack of coordination: Secondary | ICD-10-CM

## 2021-09-21 DIAGNOSIS — M62838 Other muscle spasm: Secondary | ICD-10-CM

## 2021-09-21 NOTE — Therapy (Signed)
?OUTPATIENT PHYSICAL THERAPY TREATMENT NOTE ? ? ?Patient Name: Ruth Tran ?MRN: [id number] ?DOB:Dec 05, 1990, 31 y.o., female ?Today's Date: 09/21/2021 ? ?PCP: McLean-Scocuzza, [name], MD ?REFERRING PROVIDER: Gertie Fey,* ? ? PT End of Session - 09/21/21 1041   ? ? Visit Number 5   ? Number of Visits 12   ? Date for PT Re-Evaluation 11/14/21   ? Authorization Type IE 08/22/2021   ? PT Start Time 1040   ? PT Stop Time 1125   ? PT Time Calculation (min) 45 min   ? Activity Tolerance Patient tolerated treatment well   ? Behavior During Therapy [facility] for tasks assessed/performed   ? ?  ?  ? ?  ? ? ?Past Medical History:  ?Diagnosis Date  ? Allergy   ? Chicken pox   ? Complication of anesthesia   ? WITH HER WISDOM TEETH SURGERY SHE HAD TO GO TO THE OR TO HAVE THIS DONE DUE TO TACHYCARDIA DUE TO GRAVES DISEASE  ? Complication of anesthesia   ? Dysmenorrhea   ? Family history of endometriosis   ? mother,sister  ? Graves disease   ? graves, hyperthyroidism   ? Headache   ? MIGRAINES  ? Migraine   ? Ovarian cyst   ? Panic attack   ? UTI (urinary tract infection)   ? ?Past Surgical History:  ?Procedure Laterality Date  ? APPENDECTOMY  2005  ? WITH DRAIN PLACED DUE TO ABSCESS 2005 [facility]  ? BREAST BIOPSY Right 2017  ? had a reaction to xylo or xylo with epi where her throat closed up  ? CHROMOPERTUBATION Right 08/12/2018  ? Procedure: CHROMOPERTUBATION;  Surgeon: Benjaman Kindler, MD;  Location: [facility];  Service: Gynecology;  Laterality: Right;  ? FOOT SURGERY Bilateral 2008  ? toes going outward surgery to turn toes inward 2010   ? LAPAROSCOPIC OVARIAN CYSTECTOMY Right 08/12/2018  ? Procedure: LAPAROSCOPIC OVARIAN CYSTECTOMY;  Surgeon: Benjaman Kindler, MD;  Location: [facility];  Service: Gynecology;  Laterality: Right;  ? LYSIS OF ADHESION Right 08/12/2018  ? Procedure: LYSIS OF ADHESIONS Lysis of Omental Adhesions, right cecum adhesions, right fallopian tube and right adnexal adhesions;  Surgeon: Benjaman Kindler, MD;   Location: [facility];  Service: Gynecology;  Laterality: Right;  ? TONSILLECTOMY AND ADENOIDECTOMY N/A 04/17/2017  ? Procedure: TONSILLECTOMY AND POSSIBLE  ADENOIDECTOMY;  Surgeon: Beverly Gust, MD;  Location: [facility];  Service: ENT;  Laterality: N/A;  ? WISDOM TOOTH EXTRACTION    ? 2016  ? ?Patient Active Problem List  ? Diagnosis Date Noted  ? Forceps delivery 05/13/2021  ? Third degree laceration of perineum during delivery, postpartum 05/13/2021  ? Encounter for supervision of normal pregnancy 09/30/2020  ? Patellofemoral syndrome of right knee 12/19/2019  ? Chronic pain of right knee 09/12/2019  ? Allergic rhinitis 09/12/2019  ? Ovarian cyst 10/22/2018  ? Migraine without aura and without status migrainosus, not intractable 09/19/2017  ? Hyperthyroidism 09/19/2017  ? Seasonal allergies 09/19/2017  ? Graves disease 01/22/2014  ? Vitamin D deficiency disease 01/22/2014  ? ? ?REFERRING DIAG: R10.2 (ICD-10-CM) - Pelvic and perineal pain ? ?THERAPY DIAG:  ?Pelvic pain ? ?Other lack of coordination ? ?Other muscle spasm ? ?PERTINENT HISTORY: Third degree perineal tear, 04/2021 ? ?PRECAUTIONS: None ? ?SUBJECTIVE: Patient reports that pain has not been bad this week. Patient states that stretches have still been going okay.  ? ?PAIN:  ?Are you having pain? Yes: NPRS scale: 1/10 ?Pain location: pelvis ?Pain description: burning, heaviness ? ?  TREATMENT ? ?09/05/2021: ?INTERNAL VAGINAL EXAM: Patient educated on the purpose of the pelvic exam and articulated understanding; patient consented to the exam verbally. ?Introitus Appears: WNL ?Skin integrity: no apparent erythema, edema, or broken skin ?Scar mobility: significantly restricted on L from 3 o'clock to 6 o'clock ?Strength (PERF): deferred ?Symmetry: increased perineal tone B, bulbocavernosus R more tense than L ?Palpation: TTP of scar, + Tinel's test on L at pudendal nerve ?Prolapse: not formally assessed  ?(0 no contraction, 1 flicker, 2 weak squeeze and no lift,  3 fair squeeze and definite lift, 4 good squeeze and lift against resistance, 5 strong squeeze against strong resistance) ? ?09/21/2021: ?Manual Therapy: ?STM and TPR performed to B adductor complexes to allow for decreased tension and pain and improved posture and function ? ?Neuromuscular Re-education: ?Supine hooklying diaphragmatic breathing with VCs and TCs for downregulation of the nervous system and improved management of IAP ?Supine butterfly with PFM lengthening, BLE, for improved PFM tissue length ?Supine hooklying, hip IR/ER with pelvis elevated for pudendal nerve desensitization, B, with and without reverse kegel ?Supine hooklying, piriformis/glute stretch with pelvis elevated for pudendal nerve desensitization, B, with and without reverse kegel ? ? ?Therapeutic Exercise: ? ? ?Treatments unbilled: ? ?Post-treatment assessment: ? ?Patient educated throughout session on appropriate technique and form using multi-modal cueing, HEP, and activity modification. Patient articulated understanding and returned demonstration. ? ?Patient Response to interventions: ?0/10 pain ? ?ASSESSMENT ?Patient presents to clinic with excellent motivation to participate in therapy. Patient demonstrates deficits in posture, PFM coordination, and pain. Patient able to tolerate positions with increased pudendal nerve tension during today's session and responded positively to gentle nerve glides. Patient will benefit from continued skilled therapeutic intervention to address remaining deficits in posture, PFM coordination, and pain in order to increase function and improve overall QOL. ? ? ?Objective impairments: decreased activity tolerance, decreased coordination, decreased strength, hypomobility, increased fascial restrictions, increased muscle spasms, impaired flexibility, improper body mechanics, postural dysfunction, and pain.  ? ?Activity limitations: community activity, driving, meal prep, occupation, and shopping.   ? ?Personal factors: Behavior pattern, Past/current experiences, and 3+ comorbidities: postpartum depression, hyperthyroidism, migraines, Vit D deficiency,  are also affecting patient's functional outcome.  ? ?Rehab Potential: Good ? ?Clinical decision making: Evolving/moderate complexity ? ?Evaluation complexity: Moderate ? ? ?GOALS: ?Goals reviewed with patient? Yes ? ?SHORT TERM GOALS: Target date: 10/03/2021 ? ?Patient will demonstrate independence with HEP in order to maximize therapeutic gains and improve carryover from physical therapy sessions to ADLs in the home and community. ?Baseline: not formally assessed ?Goal status: INITIAL ? ?Patient will demonstrate independent and coordinated diaphragmatic breathing in supine with a 1:2 breathing pattern for improved down-regulation of the nervous system and improved management of intra-abdominal pressures in order to increase function at home and in the community. ?Baseline: not formally assessed ?Goal status: INITIAL ? ? ?LONG TERM GOALS: Target date: 11/14/2021 ? ?Patient will demonstrate improved function as evidenced by a score of <25 on FOTO measure for full participation in activities at home and in the community. ?Baseline: 58 ?Goal status: INITIAL ? ?Patient will decrease worst pain as reported on NPRS by at least 2 points to demonstrate clinically significant reduction in pain in order to restore/improve function and overall QOL. ?Baseline: 9/10 ?Goal status: INITIAL ? ?Patient will demonstrate circumferential and sequential contraction of >4/5 MMT, > 6 sec hold x10 and 5 consecutive quick flicks with </= 10 min rest between testing bouts, and relaxation of the PFM coordinated with breath for  improved management of intra-abdominal pressure and normal bowel and bladder function without the presence of pain nor incontinence in order to improve participation at home and in the community. ?Baseline: not formally assessed ?Goal status:  INITIAL ? ? ? ?PLAN: ?Rehab frequency: 1x/week ? ?Rehab duration: 12 weeks ? ?Planned interventions: Therapeutic exercises, Therapeutic activity, Neuromuscular re-education, Balance training, Gait training, Patient/Family education,

## 2021-09-26 ENCOUNTER — Ambulatory Visit: Payer: BC Managed Care – PPO | Admitting: Physical Therapy

## 2021-09-28 ENCOUNTER — Ambulatory Visit: Payer: BC Managed Care – PPO | Attending: Certified Nurse Midwife | Admitting: Physical Therapy

## 2021-09-28 ENCOUNTER — Encounter: Payer: Self-pay | Admitting: Physical Therapy

## 2021-09-28 DIAGNOSIS — R278 Other lack of coordination: Secondary | ICD-10-CM | POA: Insufficient documentation

## 2021-09-28 DIAGNOSIS — R102 Pelvic and perineal pain: Secondary | ICD-10-CM | POA: Diagnosis present

## 2021-09-28 DIAGNOSIS — M62838 Other muscle spasm: Secondary | ICD-10-CM | POA: Diagnosis present

## 2021-09-28 NOTE — Therapy (Signed)
?OUTPATIENT PHYSICAL THERAPY TREATMENT NOTE ? ? ?Patient Name: Ruth Tran ?MRN: [id number] ?DOB:[birth year], 31 y.o., female ?Today's Date: 09/28/2021 ? ?PCP: McLean-Scocuzza, [name], MD ?REFERRING PROVIDER: Gertie Fey,* ? ? PT End of Session - 09/28/21 1333   ? ? Visit Number 6   ? Number of Visits 12   ? Date for PT Re-Evaluation 11/14/21   ? Authorization Type IE 08/22/2021   ? PT Start Time 1330   ? PT Stop Time 1410   ? PT Time Calculation (min) 40 min   ? Activity Tolerance Patient tolerated treatment well   ? Behavior During Therapy [facility] for tasks assessed/performed   ? ?  ?  ? ?  ? ? ?Past Medical History:  ?Diagnosis Date  ? Allergy   ? Chicken pox   ? Complication of anesthesia   ? WITH HER WISDOM TEETH SURGERY SHE HAD TO GO TO THE OR TO HAVE THIS DONE DUE TO TACHYCARDIA DUE TO GRAVES DISEASE  ? Complication of anesthesia   ? Dysmenorrhea   ? Family history of endometriosis   ? mother,sister  ? Graves disease   ? graves, hyperthyroidism   ? Headache   ? MIGRAINES  ? Migraine   ? Ovarian cyst   ? Panic attack   ? UTI (urinary tract infection)   ? ?Past Surgical History:  ?Procedure Laterality Date  ? APPENDECTOMY  2005  ? WITH DRAIN PLACED DUE TO ABSCESS 2005 [facility]  ? BREAST BIOPSY Right 2017  ? had a reaction to xylo or xylo with epi where her throat closed up  ? CHROMOPERTUBATION Right 08/12/2018  ? Procedure: CHROMOPERTUBATION;  Surgeon: Benjaman Kindler, MD;  Location: [facility] ORS;  Service: Gynecology;  Laterality: Right;  ? FOOT SURGERY Bilateral 2008  ? toes going outward surgery to turn toes inward 2010   ? LAPAROSCOPIC OVARIAN CYSTECTOMY Right 08/12/2018  ? Procedure: LAPAROSCOPIC OVARIAN CYSTECTOMY;  Surgeon: Benjaman Kindler, MD;  Location: [facility] OR[facility];  Service: Gynecology;  Laterality: Right;  ? LYSIS OF ADHESION Right 08/12/2018  ? Procedure: LYSIS OF ADHESIONS Lysis of Omental Adhesions, right cecum adhesions, right fallopian tube and right adnexal adhesions;  Surgeon: Benjaman Kindler, MD;   Location: [facility] OR[facility];  Service: Gynecology;  Laterality: Right;  ? TONSILLECTOMY AND ADENOIDECTOMY N/A 04/17/2017  ? Procedure: TONSILLECTOMY AND POSSIBLE  ADENOIDECTOMY;  Surgeon: Beverly Gust, MD;  Location: [facility] ORS;  Service: ENT;  Laterality: N/A;  ? WISDOM TOOTH EXTRACTION    ? 2016  ? ?Patient Active Problem List  ? Diagnosis Date Noted  ? Forceps delivery 05/13/2021  ? Third degree laceration of perineum during delivery, postpartum 05/13/2021  ? Encounter for supervision of normal pregnancy 09/30/2020  ? Patellofemoral syndrome of right knee 12/19/2019  ? Chronic pain of right knee 09/12/2019  ? Allergic rhinitis 09/12/2019  ? Ovarian cyst 10/22/2018  ? Migraine without aura and without status migrainosus, not intractable 09/19/2017  ? Hyperthyroidism 09/19/2017  ? Seasonal allergies 09/19/2017  ? Graves disease 01/22/2014  ? Vitamin D deficiency disease 01/22/2014  ? ? ?REFERRING DIAG: R10.2 (ICD-10-CM) - Pelvic and perineal pain ? ?THERAPY DIAG:  ?Pelvic pain ? ?Other lack of coordination ? ?Other muscle spasm ? ?PERTINENT HISTORY: Third degree perineal tear, 04/2021 ? ?PRECAUTIONS: None ? ?SUBJECTIVE: Patient notes that after last appointment she had no pain. Pain came back gradually and was fairly intense on Friday but only for half the day. She was able to use stretches and reverse Kegels to calm down  the pain. Patient was able to golf and walk around on an obstacle course without issue. Patient notes that her pain has remained 0-1/10 since it calmed down on Friday.   ? ?PAIN:  ?Are you having pain? Yes: NPRS scale: 1/10 ?Pain location: pelvis ?Pain description: burning, heaviness ? ?TREATMENT ? ?09/05/2021: ?INTERNAL VAGINAL EXAM: Patient educated on the purpose of the pelvic exam and articulated understanding; patient consented to the exam verbally. ?Introitus Appears: WNL ?Skin integrity: no apparent erythema, edema, or broken skin ?Scar mobility: significantly restricted on L from 3 o'clock to 6  o'clock ?Strength (PERF): deferred ?Symmetry: increased perineal tone B, bulbocavernosus R more tense than L ?Palpation: TTP of scar, + Tinel's test on L at pudendal nerve ?Prolapse: not formally assessed  ?(0 no contraction, 1 flicker, 2 weak squeeze and no lift, 3 fair squeeze and definite lift, 4 good squeeze and lift against resistance, 5 strong squeeze against strong resistance) ? ?09/21/2021: ?Manual Therapy: ?STM and TPR performed to B adductor complexes to allow for decreased tension and pain and improved posture and function ? ?Neuromuscular Re-education: ?Supine hooklying diaphragmatic breathing with VCs and TCs for downregulation of the nervous system and improved management of IAP ?Supine butterfly with PFM lengthening, BLE, for improved PFM tissue length ?Supine hooklying, hip ER stretch, B, for improved PFM tissue length ?Supine hooklying, piriformis/glute stretch B, for improved PFM tissue length ?Supine hamstring stretch, B, for improved PFM tissue length ? ? ?Therapeutic Exercise: ? ? ?Treatments unbilled: ? ?Post-treatment assessment: ? ?Patient educated throughout session on appropriate technique and form using multi-modal cueing, HEP, and activity modification. Patient articulated understanding and returned demonstration. ? ?Patient Response to interventions: ?0/10 pain ? ?ASSESSMENT ?Patient presents to clinic with excellent motivation to participate in therapy. Patient demonstrates deficits in posture, PFM coordination, and pain. Patient had increased muscle fasciculations with end range L hip abduction in the adductor complex during today's session and responded positively to manual interventions. Patient will benefit from continued skilled therapeutic intervention to address remaining deficits in posture, PFM coordination, and pain in order to increase function and improve overall QOL. ? ? ?Objective impairments: decreased activity tolerance, decreased coordination, decreased strength,  hypomobility, increased fascial restrictions, increased muscle spasms, impaired flexibility, improper body mechanics, postural dysfunction, and pain.  ? ?Activity limitations: community activity, driving, meal prep, occupation, and shopping.  ? ?Personal factors: Behavior pattern, Past/current experiences, and 3+ comorbidities: postpartum depression, hyperthyroidism, migraines, Vit D deficiency,  are also affecting patient's functional outcome.  ? ?Rehab Potential: Good ? ?Clinical decision making: Evolving/moderate complexity ? ?Evaluation complexity: Moderate ? ? ?GOALS: ?Goals reviewed with patient? Yes ? ?SHORT TERM GOALS: Target date: 10/03/2021 ? ?Patient will demonstrate independence with HEP in order to maximize therapeutic gains and improve carryover from physical therapy sessions to ADLs in the home and community. ?Baseline: not formally assessed ?Goal status: INITIAL ? ?Patient will demonstrate independent and coordinated diaphragmatic breathing in supine with a 1:2 breathing pattern for improved down-regulation of the nervous system and improved management of intra-abdominal pressures in order to increase function at home and in the community. ?Baseline: not formally assessed ?Goal status: INITIAL ? ? ?LONG TERM GOALS: Target date: 11/14/2021 ? ?Patient will demonstrate improved function as evidenced by a score of <25 on FOTO measure for full participation in activities at home and in the community. ?Baseline: 58 ?Goal status: INITIAL ? ?Patient will decrease worst pain as reported on NPRS by at least 2 points to demonstrate clinically significant reduction in pain  in order to restore/improve function and overall QOL. ?Baseline: 9/10 ?Goal status: INITIAL ? ?Patient will demonstrate circumferential and sequential contraction of >4/5 MMT, > 6 sec hold x10 and 5 consecutive quick flicks with </= 10 min rest between testing bouts, and relaxation of the PFM coordinated with breath for improved management of  intra-abdominal pressure and normal bowel and bladder function without the presence of pain nor incontinence in order to improve participation at home and in the community. ?Baseline: not formally assessed ?Goal

## 2021-10-03 ENCOUNTER — Ambulatory Visit: Payer: BC Managed Care – PPO | Admitting: Physical Therapy

## 2021-10-05 ENCOUNTER — Ambulatory Visit: Payer: BC Managed Care – PPO | Admitting: Physical Therapy

## 2021-10-06 ENCOUNTER — Encounter: Payer: Self-pay | Admitting: Physical Therapy

## 2021-10-06 ENCOUNTER — Ambulatory Visit: Payer: BC Managed Care – PPO | Admitting: Physical Therapy

## 2021-10-06 DIAGNOSIS — R102 Pelvic and perineal pain: Secondary | ICD-10-CM

## 2021-10-06 DIAGNOSIS — R278 Other lack of coordination: Secondary | ICD-10-CM

## 2021-10-06 DIAGNOSIS — M62838 Other muscle spasm: Secondary | ICD-10-CM

## 2021-10-06 NOTE — Therapy (Signed)
?OUTPATIENT PHYSICAL THERAPY TREATMENT NOTE ? ? ?Patient Name: Ruth Tran ?MRN: [id number] ?DOB:August 27, 1990, 31 y.o., female ?Today's Date: 10/06/2021 ? ?PCP: McLean-Scocuzza, [name], MD ?REFERRING PROVIDER: Gertie Fey,* ? ? PT End of Session - 10/06/21 1205   ? ? Visit Number 7   ? Number of Visits 12   ? Date for PT Re-Evaluation 11/14/21   ? Authorization Type IE 08/22/2021   ? PT Start Time 1200   ? PT Stop Time 1240   ? PT Time Calculation (min) 40 min   ? Activity Tolerance Patient tolerated treatment well   ? Behavior During Therapy [facility] for tasks assessed/performed   ? ?  ?  ? ?  ? ? ?Past Medical History:  ?Diagnosis Date  ? Allergy   ? Chicken pox   ? Complication of anesthesia   ? WITH HER WISDOM TEETH SURGERY SHE HAD TO GO TO THE OR TO HAVE THIS DONE DUE TO TACHYCARDIA DUE TO GRAVES DISEASE  ? Complication of anesthesia   ? Dysmenorrhea   ? Family history of endometriosis   ? mother,sister  ? Graves disease   ? graves, hyperthyroidism   ? Headache   ? MIGRAINES  ? Migraine   ? Ovarian cyst   ? Panic attack   ? UTI (urinary tract infection)   ? ?Past Surgical History:  ?Procedure Laterality Date  ? APPENDECTOMY  2005  ? WITH DRAIN PLACED DUE TO ABSCESS 2005 [facility]  ? BREAST BIOPSY Right 2017  ? had a reaction to xylo or xylo with epi where her throat closed up  ? CHROMOPERTUBATION Right 08/12/2018  ? Procedure: CHROMOPERTUBATION;  Surgeon: Benjaman Kindler, MD;  Location: [facility];  Service: Gynecology;  Laterality: Right;  ? FOOT SURGERY Bilateral 2008  ? toes going outward surgery to turn toes inward 2010   ? LAPAROSCOPIC OVARIAN CYSTECTOMY Right 08/12/2018  ? Procedure: LAPAROSCOPIC OVARIAN CYSTECTOMY;  Surgeon: Benjaman Kindler, MD;  Location: [facility];  Service: Gynecology;  Laterality: Right;  ? LYSIS OF ADHESION Right 08/12/2018  ? Procedure: LYSIS OF ADHESIONS Lysis of Omental Adhesions, right cecum adhesions, right fallopian tube and right adnexal adhesions;  Surgeon: Benjaman Kindler, MD;   Location: [facility];  Service: Gynecology;  Laterality: Right;  ? TONSILLECTOMY AND ADENOIDECTOMY N/A 04/17/2017  ? Procedure: TONSILLECTOMY AND POSSIBLE  ADENOIDECTOMY;  Surgeon: Beverly Gust, MD;  Location: [facility];  Service: ENT;  Laterality: N/A;  ? WISDOM TOOTH EXTRACTION    ? 2016  ? ?Patient Active Problem List  ? Diagnosis Date Noted  ? Forceps delivery 05/13/2021  ? Third degree laceration of perineum during delivery, postpartum 05/13/2021  ? Encounter for supervision of normal pregnancy 09/30/2020  ? Patellofemoral syndrome of right knee 12/19/2019  ? Chronic pain of right knee 09/12/2019  ? Allergic rhinitis 09/12/2019  ? Ovarian cyst 10/22/2018  ? Migraine without aura and without status migrainosus, not intractable 09/19/2017  ? Hyperthyroidism 09/19/2017  ? Seasonal allergies 09/19/2017  ? Graves disease 01/22/2014  ? Vitamin D deficiency disease 01/22/2014  ? ? ?REFERRING DIAG: R10.2 (ICD-10-CM) - Pelvic and perineal pain ? ?THERAPY DIAG:  ?Pelvic pain ? ?Other lack of coordination ? ?Other muscle spasm ? ?PERTINENT HISTORY: Third degree perineal tear, 04/2021 ? ?PRECAUTIONS: None ? ?SUBJECTIVE: Patient states that she had 72 hours of relief after last session. Patient felt pain-free enough to try penetrative sex which resulted in significantly increased pain (burning and friction). Patient adds that this pain lasted until mid-day Sunday.  ? ?  PAIN:  ?Are you having pain? Yes: NPRS scale: 2/10 ?Pain location: pelvis ?Pain description: burning, heaviness ? ?TREATMENT ? ?09/05/2021: ?INTERNAL VAGINAL EXAM: Patient educated on the purpose of the pelvic exam and articulated understanding; patient consented to the exam verbally. ?Introitus Appears: WNL ?Skin integrity: no apparent erythema, edema, or broken skin ?Scar mobility: significantly restricted on L from 3 o'clock to 6 o'clock ?Strength (PERF): deferred ?Symmetry: increased perineal tone B, bulbocavernosus R more tense than L ?Palpation: TTP of  scar, + Tinel's test on L at pudendal nerve ?Prolapse: not formally assessed  ?(0 no contraction, 1 flicker, 2 weak squeeze and no lift, 3 fair squeeze and definite lift, 4 good squeeze and lift against resistance, 5 strong squeeze against strong resistance) ? ?10/06/2021: ?Manual Therapy: ? ? ?Neuromuscular Re-education: ?Patient education on circular model of female sexual response, biopsychosocial factors impacting sexual function, and fear avoidance model for sexual pain.  ?Patient education on strategies to downtrain nervous system for improved participation in ADLs (sexual activity) including: dilator graded exposure and sensate focus. Provided with handouts for improved understanding.  ? ?Therapeutic Exercise: ? ? ?Treatments unbilled: ? ?Post-treatment assessment: ? ?Patient educated throughout session on appropriate technique and form using multi-modal cueing, HEP, and activity modification. Patient articulated understanding and returned demonstration. ? ?Patient Response to interventions: ?Comfortable to return in 2-3 weeks ? ?ASSESSMENT ?Patient presents to clinic with excellent motivation to participate in therapy. Patient demonstrates deficits in posture, PFM coordination, and pain. Patient participated in pain neuroscience educational session and was receptive to topics discussed. Patient will benefit from continued skilled therapeutic intervention to address remaining deficits in posture, PFM coordination, and pain in order to increase function and improve overall QOL. ? ? ?Objective impairments: decreased activity tolerance, decreased coordination, decreased strength, hypomobility, increased fascial restrictions, increased muscle spasms, impaired flexibility, improper body mechanics, postural dysfunction, and pain.  ? ?Activity limitations: community activity, driving, meal prep, occupation, and shopping.  ? ?Personal factors: Behavior pattern, Past/current experiences, and 3+ comorbidities:  postpartum depression, hyperthyroidism, migraines, Vit D deficiency,  are also affecting patient's functional outcome.  ? ?Rehab Potential: Good ? ?Clinical decision making: Evolving/moderate complexity ? ?Evaluation complexity: Moderate ? ? ?GOALS: ?Goals reviewed with patient? Yes ? ?SHORT TERM GOALS: Target date: 10/03/2021 ? ?Patient will demonstrate independence with HEP in order to maximize therapeutic gains and improve carryover from physical therapy sessions to ADLs in the home and community. ?Baseline: not formally assessed ?Goal status: INITIAL ? ?Patient will demonstrate independent and coordinated diaphragmatic breathing in supine with a 1:2 breathing pattern for improved down-regulation of the nervous system and improved management of intra-abdominal pressures in order to increase function at home and in the community. ?Baseline: not formally assessed ?Goal status: INITIAL ? ? ?LONG TERM GOALS: Target date: 11/14/2021 ? ?Patient will demonstrate improved function as evidenced by a score of <25 on FOTO measure for full participation in activities at home and in the community. ?Baseline: 58 ?Goal status: INITIAL ? ?Patient will decrease worst pain as reported on NPRS by at least 2 points to demonstrate clinically significant reduction in pain in order to restore/improve function and overall QOL. ?Baseline: 9/10 ?Goal status: INITIAL ? ?Patient will demonstrate circumferential and sequential contraction of >4/5 MMT, > 6 sec hold x10 and 5 consecutive quick flicks with </= 10 min rest between testing bouts, and relaxation of the PFM coordinated with breath for improved management of intra-abdominal pressure and normal bowel and bladder function without the presence of pain nor incontinence  in order to improve participation at home and in the community. ?Baseline: not formally assessed ?Goal status: INITIAL ? ? ? ?PLAN: ?Rehab frequency: 1x/week ? ?Rehab duration: 12 weeks ? ?Planned interventions: Therapeutic  exercises, Therapeutic activity, Neuromuscular re-education, Balance training, Gait training, Patient/Family education, Joint mobilization, Orthotic/Fit training, Electrical stimulation, Spinal manipulation,

## 2021-10-20 ENCOUNTER — Encounter: Payer: Self-pay | Admitting: Internal Medicine

## 2021-10-20 ENCOUNTER — Ambulatory Visit (INDEPENDENT_AMBULATORY_CARE_PROVIDER_SITE_OTHER): Payer: BC Managed Care – PPO | Admitting: Internal Medicine

## 2021-10-20 VITALS — BP 110/60 | HR 73 | Temp 98.0°F | Resp 14 | Ht 65.0 in | Wt 171.0 lb

## 2021-10-20 DIAGNOSIS — E559 Vitamin D deficiency, unspecified: Secondary | ICD-10-CM

## 2021-10-20 DIAGNOSIS — Z0000 Encounter for general adult medical examination without abnormal findings: Secondary | ICD-10-CM | POA: Diagnosis not present

## 2021-10-20 DIAGNOSIS — R7989 Other specified abnormal findings of blood chemistry: Secondary | ICD-10-CM

## 2021-10-20 DIAGNOSIS — R739 Hyperglycemia, unspecified: Secondary | ICD-10-CM

## 2021-10-20 DIAGNOSIS — R946 Abnormal results of thyroid function studies: Secondary | ICD-10-CM

## 2021-10-20 DIAGNOSIS — D72829 Elevated white blood cell count, unspecified: Secondary | ICD-10-CM | POA: Diagnosis not present

## 2021-10-20 DIAGNOSIS — Z1329 Encounter for screening for other suspected endocrine disorder: Secondary | ICD-10-CM

## 2021-10-20 DIAGNOSIS — E611 Iron deficiency: Secondary | ICD-10-CM

## 2021-10-20 DIAGNOSIS — E538 Deficiency of other specified B group vitamins: Secondary | ICD-10-CM

## 2021-10-20 DIAGNOSIS — Z1389 Encounter for screening for other disorder: Secondary | ICD-10-CM

## 2021-10-20 DIAGNOSIS — E785 Hyperlipidemia, unspecified: Secondary | ICD-10-CM | POA: Diagnosis not present

## 2021-10-20 DIAGNOSIS — Z8639 Personal history of other endocrine, nutritional and metabolic disease: Secondary | ICD-10-CM

## 2021-10-20 NOTE — Progress Notes (Addendum)
Chief Complaint  Patient presents with   Annual Exam    Non fasting, denies any pain. Pap done w/[facility] 05/2020 Dr.Shade   Annual doing  well has 19 month old son Ruth Tran   Review of Systems  Constitutional:  Negative for weight loss.  HENT:  Negative for hearing loss.   Eyes:  Negative for blurred vision.  Respiratory:  Negative for shortness of breath.   Cardiovascular:  Negative for chest pain.  Gastrointestinal:  Negative for abdominal pain and blood in stool.  Genitourinary:  Negative for dysuria.  Musculoskeletal:  Negative for falls and joint pain.  Skin:  Negative for rash.  Neurological:  Negative for headaches.  Psychiatric/Behavioral:  Negative for depression.   Past Medical History:  Diagnosis Date   Allergy    Chicken pox    Complication of anesthesia    WITH HER WISDOM TEETH SURGERY SHE HAD TO GO TO THE OR TO HAVE THIS DONE DUE TO TACHYCARDIA DUE TO GRAVES DISEASE   Complication of anesthesia    Dysmenorrhea    Family history of endometriosis    mother,sister   Graves disease    graves, hyperthyroidism    Headache    MIGRAINES   Migraine    Ovarian cyst    Panic attack    UTI (urinary tract infection)    Past Surgical History:  Procedure Laterality Date   APPENDECTOMY  2005   WITH DRAIN PLACED DUE TO ABSCESS 2005 [facility]   BREAST BIOPSY Right 2017   had a reaction to xylo or xylo with epi where her throat closed up   CHROMOPERTUBATION Right 08/12/2018   Procedure: CHROMOPERTUBATION;  Surgeon: Benjaman Kindler, MD;  Location: [facility];  Service: Gynecology;  Laterality: Right;   FOOT SURGERY Bilateral 2008   toes going outward surgery to turn toes inward 2010    LAPAROSCOPIC OVARIAN CYSTECTOMY Right 08/12/2018   Procedure: LAPAROSCOPIC OVARIAN CYSTECTOMY;  Surgeon: Benjaman Kindler, MD;  Location: [facility];  Service: Gynecology;  Laterality: Right;   LYSIS OF ADHESION Right 08/12/2018   Procedure: LYSIS OF ADHESIONS Lysis of Omental Adhesions, right cecum adhesions,  right fallopian tube and right adnexal adhesions;  Surgeon: Benjaman Kindler, MD;  Location: [facility];  Service: Gynecology;  Laterality: Right;   TONSILLECTOMY AND ADENOIDECTOMY N/A 04/17/2017   Procedure: TONSILLECTOMY AND POSSIBLE  ADENOIDECTOMY;  Surgeon: Beverly Gust, MD;  Location: [facility];  Service: ENT;  Laterality: N/A;   WISDOM TOOTH EXTRACTION     2016   Family History  Problem Relation Age of Onset   Hypertension Mother    Migraines Mother    Diabetes Father    Hyperlipidemia Father    Learning disabilities Sister    Cancer Maternal Grandmother        lung (small cell) >liver>pancreas smoker    Migraines Maternal Grandmother    Graves' disease Maternal Grandfather    Miscarriages / Stillbirths Maternal Grandfather    Cancer Maternal Grandfather        prostate/bladder    Ovarian cancer Paternal Grandmother    Cancer Paternal Grandmother        brain   Cancer Paternal Grandfather        skin cancer    Graves' disease Other    Breast cancer Neg Hx    Colon cancer Neg Hx    Heart disease Neg Hx    Social History   Socioeconomic History   Marital status: Married    Spouse name: Not on file  Number of children: 0   Years of education: Not on file   Highest education level: Bachelor's degree (e.g., BA, AB, BS)  Occupational History   Not on file  Tobacco Use   Smoking status: Never   Smokeless tobacco: Never  Vaping Use   Vaping Use: Never used  Substance and Sexual Activity   Alcohol use: No    Alcohol/week: 0.0 standard drinks   Drug use: No   Sexual activity: Never    Birth control/protection: Other-see comments    Comment: undecided  Other Topics Concern   Not on file  Social History Narrative   BA Degree [employer]   Wears seat belt,    Guns at home    Safe in relationship    Does not exercise    Caffeine: cup of tea daily   Getting married 04/2020 new last name Lemaster   64 month old son Ruth Tran as of 09/2021    Social Determinants of Health    Financial Resource Strain: Not on file  Food Insecurity: Not on file  Transportation Needs: Not on file  Physical Activity: Not on file  Stress: Not on file  Social Connections: Not on file  Intimate Partner Violence: Not on file   Current Meds  Medication Sig   montelukast (SINGULAIR) 10 MG tablet Take 10 mg by mouth at bedtime.   sertraline (ZOLOFT) 50 MG tablet SMARTSIG:1 Tablet(s) By Mouth Daily   SPRINTEC 28 0.25-35 MG-MCG tablet SMARTSIG:1 Tablet(s) By Mouth Daily   Allergies  Allergen Reactions   Lidocaine Anaphylaxis   Lidocaine-Epinephrine Anaphylaxis   Toradol [Ketorolac Tromethamine] Anaphylaxis and Rash    Throat swelling and feeling of heat over whole body.   Tramadol Anaphylaxis   Latex Rash   Linzess [Linaclotide]     Ab cramps    Tape Rash   No results found for this or any previous visit (from the past 2160 hour(s)). Objective  Body mass index is 28.46 kg/m. Wt Readings from Last 3 Encounters:  10/20/21 171 lb (77.6 kg)  05/13/21 219 lb 9.6 oz (99.6 kg)  01/15/21 185 lb (83.9 kg)   Temp Readings from Last 3 Encounters:  10/20/21 98 F (36.7 C) (Oral)  05/15/21 (!) 97.3 F (36.3 C) (Oral)  04/25/21 98.3 F (36.8 C) (Oral)   BP Readings from Last 3 Encounters:  10/20/21 110/60  05/15/21 117/75  05/05/21 118/69   Pulse Readings from Last 3 Encounters:  10/20/21 73  05/15/21 81  05/05/21 99    Physical Exam Vitals and nursing note reviewed.  Constitutional:      Appearance: Normal appearance. She is well-developed and well-groomed.  HENT:     Head: Normocephalic and atraumatic.  Eyes:     Conjunctiva/sclera: Conjunctivae normal.     Pupils: Pupils are equal, round, and reactive to light.  Cardiovascular:     Rate and Rhythm: Normal rate and regular rhythm.     Heart sounds: Normal heart sounds. No murmur heard. Pulmonary:     Effort: Pulmonary effort is normal.     Breath sounds: Normal breath sounds.  Abdominal:     General:  Abdomen is flat. Bowel sounds are normal.     Tenderness: There is no abdominal tenderness.  Musculoskeletal:        General: No tenderness.  Skin:    General: Skin is warm and dry.  Neurological:     General: No focal deficit present.     Mental Status: She is alert and oriented  to person, place, and time. Mental status is at baseline.     Cranial Nerves: Cranial nerves 2-12 are intact.     Motor: Motor function is intact.     Coordination: Coordination is intact.     Gait: Gait is intact.  Psychiatric:        Attention and Perception: Attention and perception normal.        Mood and Affect: Mood and affect normal.        Speech: Speech normal.        Behavior: Behavior normal. Behavior is cooperative.        Thought Content: Thought content normal.        Cognition and Memory: Cognition and memory normal.        Judgment: Judgment normal.    Assessment  Plan  Annual physical exam - Plan: Comprehensive metabolic panel, Lipid panel, CBC with Differential/Platelet, TSH, T4, free, Urinalysis, Routine w reflex microscopic, Vitamin D (25 hydroxy)  H/o Graves and hyperthyroidism now TSH elevated  Refer [facility] endocrine  Latest Reference Range & Units 10/16/17 11:40 11/05/17 09:43 12/23/18 11:05 02/19/20 09:01 10/28/21 10:14  TSH 0.35 - 5.50 uIU/mL 1.950 3.190 0.192 (L) 2.99 11.99 (H)  (L): Data is abnormally low (H): Data is abnormally high   Never had flu shot  ? Date HPV  Tdap 02/18/21 Hold covid 19 vaccine per pt per ob/gyn  3/3 hep B vaccines hep B protected  STD check 03/2017 [facility] neg f/u with Dr. Leafy Ro  -pap 04/22/18 negative no HPV comment [facility] Pap 06/24/21 pap neg [facility] f/u yearly FH+ GU cancer Rec healthy diet and exercise     Dentist Joe Harns  Dr. Gabriel Carina notes reviewed 11/05/17 for Graves dx'ed 2005 tx'ed methimazole until 12/2013 TSH and T4 remained normal labs 2016, 2017, 2019 labs. Graves in remission though labs checked f/u based on labs    Consider  nurtec in the future for migraines Provider: Dr. [name] McLean-Scocuzza-Internal Medicine

## 2021-10-20 NOTE — Patient Instructions (Signed)
Let  me know if you want nurtec in the future for migraines   Rimegepant Disintegrating Tablets What is this medication? RIMEGEPANT (ri ME je pant) prevents and treats migraines. It works by blocking a substance in the body that causes migraines. This medicine may be used for other purposes; ask your health care provider or pharmacist if you have questions. COMMON BRAND NAME(S): NURTEC ODT What should I tell my care team before I take this medication? They need to know if you have any of these conditions: Kidney disease Liver disease An unusual or allergic reaction to rimegepant, other medications, foods, dyes, or preservatives Pregnant or trying to get pregnant Breast-feeding How should I use this medication? Take this medication by mouth. Take it as directed on the prescription label. Leave the tablet in the sealed pack until you are ready to take it. With dry hands, open the pack and gently remove the tablet. If the tablet breaks or crumbles, throw it away. Use a new tablet. Place the tablet in the mouth and allow it to dissolve. Then, swallow it. Do not cut, crush, or chew this medication. You do not need water to take this medication. Talk to your care team about the use of this medication in children. Special care may be needed. Overdosage: If you think you have taken too much of this medicine contact a poison control center or emergency room at once. NOTE: This medicine is only for you. Do not share this medicine with others. What if I miss a dose? This does not apply. This medication is not for regular use. What may interact with this medication? Certain medications for fungal infections, such as fluconazole, itraconazole Rifampin This list may not describe all possible interactions. Give your health care provider a list of all the medicines, herbs, non-prescription drugs, or dietary supplements you use. Also tell them if you smoke, drink alcohol, or use illegal drugs. Some items may  interact with your medicine. What should I watch for while using this medication? Visit your care team for regular checks on your progress. Tell your care team if your symptoms do not start to get better or if they get worse. What side effects may I notice from receiving this medication? Side effects that you should report to your care team as soon as possible: Allergic reactions--skin rash, itching, hives, swelling of the face, lips, tongue, or throat Side effects that usually do not require medical attention (report to your care team if they continue or are bothersome): Nausea Stomach pain This list may not describe all possible side effects. Call your doctor for medical advice about side effects. You may report side effects to FDA at 1-800-FDA-1088. Where should I keep my medication? Keep out of the reach of children and pets. Store at room temperature between 20 and 25 degrees C (68 and 77 degrees F). Get rid of any unused medication after the expiration date. To get rid of medications that are no longer needed or have expired: Take the medication to a medication take-back program. Check with your pharmacy or law enforcement to find a location. If you cannot return the medication, check the label or package insert to see if the medication should be thrown out in the garbage or flushed down the toilet. If you are not sure, ask your care team. If it is safe to put it in the trash, take the medication out of the container. Mix the medication with cat litter, dirt, coffee grounds, or other unwanted substance.  Seal the mixture in a bag or container. Put it in the trash. NOTE: This sheet is a summary. It may not cover all possible information. If you have questions about this medicine, talk to your doctor, pharmacist, or health care provider.  2023 Elsevier/Gold Standard (2021-07-06 00:00:00)  Sunburn, Adult  Sunburn is damage to the skin that is caused by too much exposure to ultraviolet (UV)  rays. Repeated, prolonged sun exposure causes signs of early skin aging, such as wrinkles and sun spots. It also increases the risk of skin cancer. What are the causes? Sunburn is caused by getting too much UV radiation from the sun, sunlamps, or tanning beds. What increases the risk? The following factors may make you more likely to develop this condition: Having light-colored skin (fair complexion), skin with many freckles or moles, or skin that tends to burn instead of tan. Having fair or red hair. Having blue or green eyes. Other factors include: Living in an area with strong sun exposure. Having a family history of sensitivity to the sun or a family history of skin cancer. Having a body defense system (immune system) that does not work properly because of certain diseases (such as lupus) or certain drugs. Taking certain medicines that cause you to be sensitive to sunlight (have photosensitivity). What are the signs or symptoms? Symptoms of this condition include: Red or pink skin. Soreness and swelling of the skin in the affected areas. Pain. Blisters. Peeling skin. If the sunburn is severe, you may also have a headache, fever, nausea, dizziness, or fatigue. How is this diagnosed? This condition is diagnosed with a medical history and physical exam. How is this treated? Mild or moderate sunburns can often be managed with self-care strategies, including: Cool baths or cool, wet cloths (cool compresses). Moisturizer or aloe for pain relief. Over-the-counter pain relievers. Drinking extra water to replace lost fluids and to prevent dehydration. A severe sunburn may require: Antibiotic medicines if there is an associated infection. IV fluids. Follow these instructions at home: Medicines Take or apply over-the-counter and prescription medicines only as told by your health care provider. If you were prescribed an antibiotic medicine, use it as told by your health care provider. Do  not stop using the antibiotic even if your condition improves. General instructions Avoid further exposure to the sun. Protect sunburned skin by wearing clothing that covers the injured skin. Do not put ice on your sunburn. This can cause further damage. Try taking a cool bath or applying a cool compress to your skin. This may help with pain. Drink enough fluid to keep your urine pale yellow. Try applying aloe vera or a moisturizer that has soy in it to your sunburn. This may help. Do not apply aloe vera or moisturizer with soy if your sunburn has blisters. Do not break any blisters that you may have. Keep all follow-up visits. This is important. How is this prevented?  Try to avoid the sun between 10 a.m. and 4 p.m. The sun is strongest during those hours. Apply sunscreen 15-30 minutes before you will be out in the sun. Apply a sunscreen with an SPF of 30 or higher. Consider using an SPF of 30 or higher if you will be exposed to the sun for prolonged periods of time. Use a sunscreen that protects against all of the sun's rays (broad-spectrum) and is water-resistant. Reapply sunscreen: About every 2 hours during sun exposure. More often when sweating a lot while out in the sun. After getting wet  from swimming or playing in water. When you are outside, wear long sleeves, a hat, and sunglasses that block UV light. Talk with your health care provider about medicines, herbs, and foods that can make you more sensitive to light. Avoid these, if possible. Do not use tanning beds. Contact a health care provider if: You have a fever or chills. Your symptoms do not improve with treatment. Your pain is not controlled with medicine. Your burn becomes more painful or swollen. You develop open blisters. Get help right away if: You are dizzy or you pass out. You have a severe headache or you feel confused. You vomit or have diarrhea. You develop severe blistering. You have pus or fluid coming from  the blisters. These symptoms may represent a serious problem that is an emergency. Do not wait to see if the symptoms will go away. Get medical help right away. Call your local emergency services (911 in the [place] Do not drive yourself to the hospital. Summary Sunburn is caused by getting too much ultraviolet (UV) radiation from the sun, sunlamps, or tanning beds. People with light-colored skin (fair complexion) have an increased risk of sunburn. Mild or moderate sunburns can often be managed with self-care strategies, including cool baths or cool cloths (compresses). To help prevent sunburn, apply sunscreen 15-30 minutes or more before you will be exposed to the sun. This information is not intended to replace advice given to you by your health care provider. Make sure you discuss any questions you have with your health care provider. Document Revised: 08/18/2020 Document Reviewed: 08/18/2020 Elsevier Patient Education  [place].

## 2021-10-26 ENCOUNTER — Encounter: Payer: Self-pay | Admitting: Physical Therapy

## 2021-10-26 ENCOUNTER — Ambulatory Visit: Payer: BC Managed Care – PPO | Admitting: Physical Therapy

## 2021-10-26 DIAGNOSIS — R102 Pelvic and perineal pain: Secondary | ICD-10-CM

## 2021-10-26 DIAGNOSIS — M62838 Other muscle spasm: Secondary | ICD-10-CM

## 2021-10-26 DIAGNOSIS — R278 Other lack of coordination: Secondary | ICD-10-CM

## 2021-10-26 NOTE — Therapy (Signed)
OUTPATIENT PHYSICAL THERAPY TREATMENT NOTE   Patient Name: Ruth Tran MRN: [id number] DOB:December 27, 1990, 31 y.o., female Today's Date: 10/26/2021  PCP: McLean-Scocuzza, [name], MD REFERRING PROVIDER: McLean-Scocuzza, [name] *   PT End of Session - 10/26/21 1458     Visit Number 8    Number of Visits 12    Date for PT Re-Evaluation 11/14/21    Authorization Type IE 08/22/2021    PT Start Time 1457    PT Stop Time 1535    PT Time Calculation (min) 38 min    Activity Tolerance Patient tolerated treatment well    Behavior During Therapy WFL for tasks assessed/performed             Past Medical History:  Diagnosis Date   Allergy    Chicken pox    Complication of anesthesia    WITH HER WISDOM TEETH SURGERY SHE HAD TO GO TO THE OR TO HAVE THIS DONE DUE TO TACHYCARDIA DUE TO GRAVES DISEASE   Complication of anesthesia    Dysmenorrhea    Family history of endometriosis    mother,sister   Graves disease    graves, hyperthyroidism    Headache    MIGRAINES   Migraine    Ovarian cyst    Panic attack    UTI (urinary tract infection)    Past Surgical History:  Procedure Laterality Date   APPENDECTOMY  2005   WITH DRAIN PLACED DUE TO ABSCESS 2005 [facility]   BREAST BIOPSY Right 2017   had a reaction to xylo or xylo with epi where her throat closed up   CHROMOPERTUBATION Right 08/12/2018   Procedure: CHROMOPERTUBATION;  Surgeon: Benjaman Kindler, MD;  Location: [facility] OR[facility];  Service: Gynecology;  Laterality: Right;   FOOT SURGERY Bilateral 2008   toes going outward surgery to turn toes inward 2010    LAPAROSCOPIC OVARIAN CYSTECTOMY Right 08/12/2018   Procedure: LAPAROSCOPIC OVARIAN CYSTECTOMY;  Surgeon: Benjaman Kindler, MD;  Location: [facility] OR[facility];  Service: Gynecology;  Laterality: Right;   LYSIS OF ADHESION Right 08/12/2018   Procedure: LYSIS OF ADHESIONS Lysis of Omental Adhesions, right cecum adhesions, right fallopian tube and right adnexal adhesions;  Surgeon: Benjaman Kindler, MD;   Location: [facility] OR[facility];  Service: Gynecology;  Laterality: Right;   TONSILLECTOMY AND ADENOIDECTOMY N/A 04/17/2017   Procedure: TONSILLECTOMY AND POSSIBLE  ADENOIDECTOMY;  Surgeon: Beverly Gust, MD;  Location: [facility] OR[facility];  Service: ENT;  Laterality: N/A;   [place] EXTRACTION     2016   Patient Active Problem List   Diagnosis Date Noted   Forceps delivery 05/13/2021   Third degree laceration of perineum during delivery, postpartum 05/13/2021   Encounter for supervision of normal pregnancy 09/30/2020   Patellofemoral syndrome of right knee 12/19/2019   Chronic pain of right knee 09/12/2019   Allergic rhinitis 09/12/2019   Ovarian cyst 10/22/2018   Migraine without aura and without status migrainosus, not intractable 09/19/2017   Hyperthyroidism 09/19/2017   Seasonal allergies 09/19/2017   Graves disease 01/22/2014   Vitamin D deficiency disease 01/22/2014    REFERRING DIAG: R10.2 (ICD-10-CM) - Pelvic and perineal pain  THERAPY DIAG:  Pelvic pain  Other lack of coordination  Other muscle spasm  PERTINENT HISTORY: Third degree perineal tear, 04/2021  PRECAUTIONS: None  SUBJECTIVE: Patient notes that she has tried dilator training and found it to be quite challenging to keep the nervous system in a relaxed state. She was able to work up to insertion, but cannot make it past the  introitus without hitting a wall and having increased pain. Patient expressing mild-moderate emotional distress surrounding the persistence of pelvic pain symptoms.   PAIN:  Are you having pain? Yes: NPRS scale: 0/10 Pain location:   Pain description:    TREATMENT  09/05/2021: INTERNAL VAGINAL EXAM: Patient educated on the purpose of the pelvic exam and articulated understanding; patient consented to the exam verbally. Introitus Appears: WNL Skin integrity: no apparent erythema, edema, or broken skin Scar mobility: significantly restricted on L from 3 o'clock to 6 o'clock Strength (PERF):  deferred Symmetry: increased perineal tone B, bulbocavernosus R more tense than L Palpation: TTP of scar, + Tinel's test on L at pudendal nerve Prolapse: not formally assessed  (0 no contraction, 1 flicker, 2 weak squeeze and no lift, 3 fair squeeze and definite lift, 4 good squeeze and lift against resistance, 5 strong squeeze against strong resistance)  10/26/2021: Manual Therapy:   Neuromuscular Re-education: Patient education on strategies to maximize productivity with dilator training, more structured approach to sensate focus for improved nervous system downregulation, factors impacting sexual desire including but not limited to pain experience and hormonal imbalances, and benefits of working with sex therapist to navigate the psychological impacts of a a change in sexual function.  Therapeutic Exercise:   Treatments unbilled:  Post-treatment assessment:  Patient educated throughout session on appropriate technique and form using multi-modal cueing, HEP, and activity modification. Patient articulated understanding and returned demonstration.  Patient Response to interventions: Comfortable to return in 4 weeks  ASSESSMENT Patient presents to clinic with excellent motivation to participate in therapy. Patient demonstrates deficits in posture, PFM coordination, and pain. Patient participated in pain neuroscience educational session and was receptive to topics discussed; we discussed modifiable factors for improved results with graded exposure to both partner touch and to penetration. Patient will benefit from continued skilled therapeutic intervention to address remaining deficits in posture, PFM coordination, and pain in order to increase function and improve overall QOL.   Objective impairments: decreased activity tolerance, decreased coordination, decreased strength, hypomobility, increased fascial restrictions, increased muscle spasms, impaired flexibility, improper body mechanics,  postural dysfunction, and pain.   Activity limitations: community activity, driving, meal prep, occupation, and shopping.   Personal factors: Behavior pattern, Past/current experiences, and 3+ comorbidities: postpartum depression, hyperthyroidism, migraines, Vit D deficiency,  are also affecting patient's functional outcome.   Rehab Potential: Good  Clinical decision making: Evolving/moderate complexity  Evaluation complexity: Moderate   GOALS: Goals reviewed with patient? Yes  SHORT TERM GOALS: Target date: 10/03/2021  Patient will demonstrate independence with HEP in order to maximize therapeutic gains and improve carryover from physical therapy sessions to ADLs in the home and community. Baseline: not formally assessed Goal status: INITIAL  Patient will demonstrate independent and coordinated diaphragmatic breathing in supine with a 1:2 breathing pattern for improved down-regulation of the nervous system and improved management of intra-abdominal pressures in order to increase function at home and in the community. Baseline: not formally assessed Goal status: INITIAL   LONG TERM GOALS: Target date: 11/14/2021  Patient will demonstrate improved function as evidenced by a score of <25 on FOTO measure for full participation in activities at home and in the community. Baseline: 58 Goal status: INITIAL  Patient will decrease worst pain as reported on NPRS by at least 2 points to demonstrate clinically significant reduction in pain in order to restore/improve function and overall QOL. Baseline: 9/10 Goal status: INITIAL  Patient will demonstrate circumferential and sequential contraction of >4/5 MMT, >  6 sec hold x10 and 5 consecutive quick flicks with </= 10 min rest between testing bouts, and relaxation of the PFM coordinated with breath for improved management of intra-abdominal pressure and normal bowel and bladder function without the presence of pain nor incontinence in order to  improve participation at home and in the community. Baseline: not formally assessed Goal status: INITIAL    PLAN: Rehab frequency: 1x/week  Rehab duration: 12 weeks  Planned interventions: Therapeutic exercises, Therapeutic activity, Neuromuscular re-education, Balance training, Gait training, Patient/Family education, Joint mobilization, Orthotic/Fit training, Electrical stimulation, Spinal manipulation, Spinal mobilization, Cryotherapy, scar mobilization, Taping, and Manual therapy   Myles Gip PT, DPT [phone]  10/26/2021, 2:59 PM

## 2021-10-28 ENCOUNTER — Other Ambulatory Visit (INDEPENDENT_AMBULATORY_CARE_PROVIDER_SITE_OTHER): Payer: BC Managed Care – PPO

## 2021-10-28 DIAGNOSIS — E559 Vitamin D deficiency, unspecified: Secondary | ICD-10-CM

## 2021-10-28 DIAGNOSIS — E538 Deficiency of other specified B group vitamins: Secondary | ICD-10-CM | POA: Diagnosis not present

## 2021-10-28 DIAGNOSIS — E785 Hyperlipidemia, unspecified: Secondary | ICD-10-CM

## 2021-10-28 DIAGNOSIS — E611 Iron deficiency: Secondary | ICD-10-CM | POA: Diagnosis not present

## 2021-10-28 DIAGNOSIS — Z0000 Encounter for general adult medical examination without abnormal findings: Secondary | ICD-10-CM | POA: Diagnosis not present

## 2021-10-28 DIAGNOSIS — Z1389 Encounter for screening for other disorder: Secondary | ICD-10-CM

## 2021-10-28 DIAGNOSIS — Z1329 Encounter for screening for other suspected endocrine disorder: Secondary | ICD-10-CM

## 2021-10-28 DIAGNOSIS — R739 Hyperglycemia, unspecified: Secondary | ICD-10-CM | POA: Diagnosis not present

## 2021-10-28 LAB — CBC WITH DIFFERENTIAL/PLATELET
Basophils Absolute: 0 10*3/uL (ref 0.0–0.1)
Basophils Relative: 0.3 % (ref 0.0–3.0)
Eosinophils Absolute: 0 10*3/uL (ref 0.0–0.7)
Eosinophils Relative: 0.7 % (ref 0.0–5.0)
HCT: 38.2 % (ref 36.0–46.0)
Hemoglobin: 12.6 g/dL (ref 12.0–15.0)
Lymphocytes Relative: 40.4 % (ref 12.0–46.0)
Lymphs Abs: 2.2 10*3/uL (ref 0.7–4.0)
MCHC: 32.9 g/dL (ref 30.0–36.0)
MCV: 85 fl (ref 78.0–100.0)
Monocytes Absolute: 0.5 10*3/uL (ref 0.1–1.0)
Monocytes Relative: 9.7 % (ref 3.0–12.0)
Neutro Abs: 2.6 10*3/uL (ref 1.4–7.7)
Neutrophils Relative %: 48.9 % (ref 43.0–77.0)
Platelets: 121 10*3/uL — ABNORMAL LOW (ref 150.0–400.0)
RBC: 4.49 Mil/uL (ref 3.87–5.11)
RDW: 16.3 % — ABNORMAL HIGH (ref 11.5–15.5)
WBC: 5.4 10*3/uL (ref 4.0–10.5)

## 2021-10-28 LAB — COMPREHENSIVE METABOLIC PANEL
ALT: 15 U/L (ref 0–35)
AST: 26 U/L (ref 0–37)
Albumin: 3.9 g/dL (ref 3.5–5.2)
Alkaline Phosphatase: 67 U/L (ref 39–117)
BUN: 11 mg/dL (ref 6–23)
CO2: 28 mEq/L (ref 19–32)
Calcium: 9 mg/dL (ref 8.4–10.5)
Chloride: 108 mEq/L (ref 96–112)
Creatinine, Ser: 0.71 mg/dL (ref 0.40–1.20)
GFR: 115.58 mL/min (ref >60.00)
Glucose, Bld: 87 mg/dL (ref 70–99)
Potassium: 4.2 mEq/L (ref 3.5–5.1)
Sodium: 141 mEq/L (ref 135–145)
Total Bilirubin: 0.6 mg/dL (ref 0.2–1.2)
Total Protein: 6.5 g/dL (ref 6.0–8.3)

## 2021-10-28 LAB — VITAMIN B12: Vitamin B-12: 253 pg/mL (ref 211–911)

## 2021-10-28 LAB — IBC + FERRITIN
Ferritin: 15.4 ng/mL (ref 10.0–291.0)
Iron: 63 ug/dL (ref 42–145)
Saturation Ratios: 15.2 % — ABNORMAL LOW (ref 20.0–50.0)
TIBC: 415.8 ug/dL (ref 250.0–450.0)
Transferrin: 297 mg/dL (ref 212.0–360.0)

## 2021-10-28 LAB — LIPID PANEL
Cholesterol: 213 mg/dL — ABNORMAL HIGH (ref 0–200)
HDL: 64.5 mg/dL (ref >39.00)
LDL Cholesterol: 126 mg/dL — ABNORMAL HIGH (ref 0–99)
NonHDL: 148.01
Total CHOL/HDL Ratio: 3
Triglycerides: 110 mg/dL (ref 0.0–149.0)
VLDL: 22 mg/dL (ref 0.0–40.0)

## 2021-10-28 LAB — TSH: TSH: 11.99 u[IU]/mL — ABNORMAL HIGH (ref 0.35–5.50)

## 2021-10-28 LAB — HEMOGLOBIN A1C: Hgb A1c MFr Bld: 5.4 % (ref 4.6–6.5)

## 2021-10-28 LAB — T4, FREE: Free T4: 0.6 ng/dL (ref 0.60–1.60)

## 2021-10-28 LAB — VITAMIN D 25 HYDROXY (VIT D DEFICIENCY, FRACTURES): VITD: 18.92 ng/mL — ABNORMAL LOW (ref 30.00–100.00)

## 2021-10-29 LAB — URINALYSIS, ROUTINE W REFLEX MICROSCOPIC
Bilirubin Urine: NEGATIVE
Glucose, UA: NEGATIVE
Hgb urine dipstick: NEGATIVE
Ketones, ur: NEGATIVE
Leukocytes,Ua: NEGATIVE
Nitrite: NEGATIVE
Protein, ur: NEGATIVE
Specific Gravity, Urine: 1.022 (ref 1.001–1.035)
pH: 6 (ref 5.0–8.0)

## 2021-10-30 ENCOUNTER — Encounter: Payer: Self-pay | Admitting: Internal Medicine

## 2021-10-31 DIAGNOSIS — R7989 Other specified abnormal findings of blood chemistry: Secondary | ICD-10-CM | POA: Insufficient documentation

## 2021-10-31 NOTE — Addendum Note (Signed)
Addended by: Orland Mustard on: 10/31/2021 10:27 PM   Modules accepted: Orders

## 2021-11-01 ENCOUNTER — Telehealth: Payer: Self-pay

## 2021-11-01 NOTE — Telephone Encounter (Signed)
Lvm for pt to return call in regards to lab results. Pt has seen results via mychart on 10/31/21  Per Dr.Tracy: Iron saturation low rec prenatal vitamins with iron  Vitamin D low  -is she breastfeeding ? If not we an give her high dose vitamin D 1x per week  If so max dose vitamin D3 2000 IU daily     Thyroid lab elevated and uncontrolled  -I would recommend pt see [facility] endocrine again is she agreeable to this?  Pt sent my chart wanted referral to Dr. Cruzita Lederer [place] [facility] referred but informed of the wait time    Platelets slightly low as well  -is she drinking alcohol?     Cholesterol elevated  -rec healthy diet and exercise     B12 low normal rec B12 1000 mcg daily lunch/dinner     A1c no prediabetes     Liver kidneys normal     Urine normal

## 2021-11-06 ENCOUNTER — Other Ambulatory Visit: Payer: Self-pay | Admitting: Internal Medicine

## 2021-11-06 DIAGNOSIS — R946 Abnormal results of thyroid function studies: Secondary | ICD-10-CM | POA: Insufficient documentation

## 2021-11-06 DIAGNOSIS — E559 Vitamin D deficiency, unspecified: Secondary | ICD-10-CM | POA: Insufficient documentation

## 2021-11-06 MED ORDER — CHOLECALCIFEROL 1.25 MG (50000 UT) PO CAPS: 50000.0000 [IU] | ORAL_CAPSULE | ORAL | 1 refills | Status: AC

## 2021-11-06 NOTE — Addendum Note (Signed)
Addended by: Orland Mustard on: 11/06/2021 10:06 PM   Modules accepted: Orders

## 2021-11-23 ENCOUNTER — Ambulatory Visit: Payer: BC Managed Care – PPO | Attending: Certified Nurse Midwife | Admitting: Physical Therapy

## 2021-11-23 ENCOUNTER — Encounter: Payer: Self-pay | Admitting: Physical Therapy

## 2021-11-23 DIAGNOSIS — M62838 Other muscle spasm: Secondary | ICD-10-CM | POA: Diagnosis present

## 2021-11-23 DIAGNOSIS — R102 Pelvic and perineal pain: Secondary | ICD-10-CM | POA: Diagnosis present

## 2021-11-23 DIAGNOSIS — R278 Other lack of coordination: Secondary | ICD-10-CM | POA: Diagnosis present

## 2021-11-23 NOTE — Therapy (Signed)
OUTPATIENT PHYSICAL THERAPY TREATMENT NOTE   Patient Name: Ruth Tran MRN: [id number] DOB:Dec 28, 1990, 31 y.o., female Today's Date: 11/23/2021  PCP: McLean-Scocuzza, [name], MD REFERRING PROVIDER: McLean-Scocuzza, [name] *   PT End of Session - 11/23/21 1420     Visit Number 9    Number of Visits 12    Date for PT Re-Evaluation 11/14/21    Authorization Type IE 08/22/2021    PT Start Time 1420    PT Stop Time 1500    PT Time Calculation (min) 40 min    Activity Tolerance Patient tolerated treatment well    Behavior During Therapy WFL for tasks assessed/performed             Past Medical History:  Diagnosis Date   Allergy    Chicken pox    Complication of anesthesia    WITH HER WISDOM TEETH SURGERY SHE HAD TO GO TO THE OR TO HAVE THIS DONE DUE TO TACHYCARDIA DUE TO GRAVES DISEASE   Complication of anesthesia    Dysmenorrhea    Family history of endometriosis    mother,sister   Graves disease    graves, hyperthyroidism    Headache    MIGRAINES   Migraine    Ovarian cyst    Panic attack    UTI (urinary tract infection)    Past Surgical History:  Procedure Laterality Date   APPENDECTOMY  2005   WITH DRAIN PLACED DUE TO ABSCESS 2005 [facility]   BREAST BIOPSY Right 2017   had a reaction to xylo or xylo with epi where her throat closed up   CHROMOPERTUBATION Right 08/12/2018   Procedure: CHROMOPERTUBATION;  Surgeon: Benjaman Kindler, MD;  Location: [facility];  Service: Gynecology;  Laterality: Right;   FOOT SURGERY Bilateral 2008   toes going outward surgery to turn toes inward 2010    LAPAROSCOPIC OVARIAN CYSTECTOMY Right 08/12/2018   Procedure: LAPAROSCOPIC OVARIAN CYSTECTOMY;  Surgeon: Benjaman Kindler, MD;  Location: [facility];  Service: Gynecology;  Laterality: Right;   LYSIS OF ADHESION Right 08/12/2018   Procedure: LYSIS OF ADHESIONS Lysis of Omental Adhesions, right cecum adhesions, right fallopian tube and right adnexal adhesions;  Surgeon: Benjaman Kindler, MD;   Location: [facility];  Service: Gynecology;  Laterality: Right;   TONSILLECTOMY AND ADENOIDECTOMY N/A 04/17/2017   Procedure: TONSILLECTOMY AND POSSIBLE  ADENOIDECTOMY;  Surgeon: Beverly Gust, MD;  Location: [facility];  Service: ENT;  Laterality: N/A;   [place] EXTRACTION     2016   Patient Active Problem List   Diagnosis Date Noted   Abnormal thyroid function test 11/06/2021   Vitamin D deficiency 11/06/2021   Elevated TSH 10/31/2021   Forceps delivery 05/13/2021   Third degree laceration of perineum during delivery, postpartum 05/13/2021   Encounter for supervision of normal pregnancy 09/30/2020   Patellofemoral syndrome of right knee 12/19/2019   Chronic pain of right knee 09/12/2019   Allergic rhinitis 09/12/2019   Ovarian cyst 10/22/2018   Migraine without aura and without status migrainosus, not intractable 09/19/2017   Hyperthyroidism 09/19/2017   Seasonal allergies 09/19/2017   Graves disease 01/22/2014   Vitamin D deficiency disease 01/22/2014    REFERRING DIAG: R10.2 (ICD-10-CM) - Pelvic and perineal pain  THERAPY DIAG:  Pelvic pain  Other lack of coordination  Other muscle spasm  PERTINENT HISTORY: Third degree perineal tear, 04/2021  PRECAUTIONS: None  SUBJECTIVE: Patient did see PCP and had bloodwork which revealed potential thyroid issues. Patient now only has pain at the introitus  in the days surrounding menstruation. Patient had mild soreness after recent attempt at sexual activity. Patient has restored ability to achieve climax. Patient notes that she continues to have high variability with pudendal nerve symptoms in relationship to physical activity, but it will get aggravated by certain postures and increased bending and sitting.   PAIN:  Are you having pain? Yes: NPRS scale: 0/10 Pain location:   Pain description:    TREATMENT  09/05/2021: INTERNAL VAGINAL EXAM: Patient educated on the purpose of the pelvic exam and articulated understanding;  patient consented to the exam verbally. Introitus Appears: WNL Skin integrity: no apparent erythema, edema, or broken skin Scar mobility: significantly restricted on L from 3 o'clock to 6 o'clock Strength (PERF): deferred Symmetry: increased perineal tone B, bulbocavernosus R more tense than L Palpation: TTP of scar, + Tinel's test on L at pudendal nerve Prolapse: not formally assessed  (0 no contraction, 1 flicker, 2 weak squeeze and no lift, 3 fair squeeze and definite lift, 4 good squeeze and lift against resistance, 5 strong squeeze against strong resistance)  11/23/2021: Manual Therapy:   Neuromuscular Re-education: Reassessed goals; see below. [facility] Supine diaphragmatic breathing Supine TrA with coordinated breath Supine heel slides with TrA, coordinated breath Supine marches with TrA, coordinated breath Supine toe taps with TrA, coordinated breath Supine 90/90 leg extension with TrA, coordinated breath Supine leg raise with TrA, coordinated breath  Therapeutic Exercise:   Treatments unbilled:  Post-treatment assessment:  Patient educated throughout session on appropriate technique and form using multi-modal cueing, HEP, and activity modification. Patient articulated understanding and returned demonstration.  Patient Response to interventions: Comfortable to return in 4 weeks  ASSESSMENT Patient presents to clinic with excellent motivation to participate in therapy. Patient demonstrates deficits in posture, PFM coordination, and pain. Patient making progress toward goal achievement in all areas, and is appropriate for more strengthening of deep core in the presence of consistent ability to relax PFM. Patient continues to have distress/disability 2/2 to pelvic pain and pudendal nerve irritation, but articulate and demonstrates appropriate modulation techniques/strategies. Patient was able to perform initial deep core stabilization interventions  with moderate cueing. Patient will benefit from continued skilled therapeutic intervention to address remaining deficits in posture, PFM coordination, and pain in order to increase function and improve overall QOL.   Objective impairments: decreased activity tolerance, decreased coordination, decreased strength, hypomobility, increased fascial restrictions, increased muscle spasms, impaired flexibility, improper body mechanics, postural dysfunction, and pain.   Activity limitations: community activity, driving, meal prep, occupation, and shopping.   Personal factors: Behavior pattern, Past/current experiences, and 3+ comorbidities: postpartum depression, hyperthyroidism, migraines, Vit D deficiency,  are also affecting patient's functional outcome.   Rehab Potential: Good  Clinical decision making: Evolving/moderate complexity  Evaluation complexity: Moderate   GOALS: Goals reviewed with patient? Yes  SHORT TERM GOALS: Target date: 10/03/2021  Patient will demonstrate independence with HEP in order to maximize therapeutic gains and improve carryover from physical therapy sessions to ADLs in the home and community. Baseline: not formally assessed; 6/28: IND Goal status: ACHIEVED  Patient will demonstrate independent and coordinated diaphragmatic breathing in supine with a 1:2 breathing pattern for improved down-regulation of the nervous system and improved management of intra-abdominal pressures in order to increase function at home and in the community. Baseline: not formally assessed; 6/28: IND Goal status: ACHIEVED   LONG TERM GOALS: Target date: 11/14/2021  Patient will demonstrate improved function as evidenced by a score of <25 on FOTO measure for  full participation in activities at home and in the community. Baseline: 58; 6/28: 29 Goal status: ON-GOING  Patient will decrease worst pain as reported on NPRS by at least 2 points to demonstrate clinically significant reduction in  pain in order to restore/improve function and overall QOL. Baseline: 9/10; 6/28: 5.5/10 (vigorous cleaning and stress) Goal status: ON-GOING  Patient will demonstrate circumferential and sequential contraction of >4/5 MMT, > 6 sec hold x10 and 5 consecutive quick flicks with </= 10 min rest between testing bouts, and relaxation of the PFM coordinated with breath for improved management of intra-abdominal pressure and normal bowel and bladder function without the presence of pain nor incontinence in order to improve participation at home and in the community. Baseline: not formally assessed; 6/28: 2/5 x 1 rep subsequent reps had hip adductor compensations Goal status: ON-GOING    PLAN: Rehab frequency: 1x/week  Rehab duration: 12 weeks  Planned interventions: Therapeutic exercises, Therapeutic activity, Neuromuscular re-education, Balance training, Gait training, Patient/Family education, Joint mobilization, Orthotic/Fit training, Electrical stimulation, Spinal manipulation, Spinal mobilization, Cryotherapy, scar mobilization, Taping, and Manual therapy   Myles Gip PT, DPT [phone]  11/23/2021, 2:21 PM

## 2021-12-07 ENCOUNTER — Encounter: Payer: Self-pay | Admitting: Nurse Practitioner

## 2021-12-07 ENCOUNTER — Ambulatory Visit: Payer: BC Managed Care – PPO | Admitting: Nurse Practitioner

## 2021-12-07 VITALS — BP 100/62 | HR 67 | Ht 65.0 in | Wt 173.0 lb

## 2021-12-07 DIAGNOSIS — Z8639 Personal history of other endocrine, nutritional and metabolic disease: Secondary | ICD-10-CM

## 2021-12-07 DIAGNOSIS — R7989 Other specified abnormal findings of blood chemistry: Secondary | ICD-10-CM | POA: Diagnosis not present

## 2021-12-07 NOTE — Progress Notes (Signed)
12/07/2021     Endocrinology Consult Note    Subjective:    Patient ID: Ruth Tran, female    DOB: May 06, 1991, PCP McLean-Scocuzza, [name], MD.   Past Medical History:  Diagnosis Date   Allergy    Chicken pox    Complication of anesthesia    WITH HER WISDOM TEETH SURGERY SHE HAD TO GO TO THE OR TO HAVE THIS DONE DUE TO TACHYCARDIA DUE TO GRAVES DISEASE   Complication of anesthesia    Dysmenorrhea    Family history of endometriosis    mother,sister   Graves disease    graves, hyperthyroidism    Headache    MIGRAINES   Migraine    Ovarian cyst    Panic attack    UTI (urinary tract infection)     Past Surgical History:  Procedure Laterality Date   APPENDECTOMY  2005   WITH DRAIN PLACED DUE TO ABSCESS 2005 [facility]   BREAST BIOPSY Right 2017   had a reaction to xylo or xylo with epi where her throat closed up   CHROMOPERTUBATION Right 08/12/2018   Procedure: CHROMOPERTUBATION;  Surgeon: Benjaman Kindler, MD;  Location: [facility];  Service: Gynecology;  Laterality: Right;   FOOT SURGERY Bilateral 2008   toes going outward surgery to turn toes inward 2010    LAPAROSCOPIC OVARIAN CYSTECTOMY Right 08/12/2018   Procedure: LAPAROSCOPIC OVARIAN CYSTECTOMY;  Surgeon: Benjaman Kindler, MD;  Location: [facility];  Service: Gynecology;  Laterality: Right;   LYSIS OF ADHESION Right 08/12/2018   Procedure: LYSIS OF ADHESIONS Lysis of Omental Adhesions, right cecum adhesions, right fallopian tube and right adnexal adhesions;  Surgeon: Benjaman Kindler, MD;  Location: [facility];  Service: Gynecology;  Laterality: Right;   TONSILLECTOMY AND ADENOIDECTOMY N/A 04/17/2017   Procedure: TONSILLECTOMY AND POSSIBLE  ADENOIDECTOMY;  Surgeon: Beverly Gust, MD;  Location: [facility];  Service: ENT;  Laterality: N/A;   WISDOM TOOTH EXTRACTION     2016    Social History   Socioeconomic History   Marital status: Married    Spouse name: Not on file   Number of children: 0   Years of  education: Not on file   Highest education level: Bachelor's degree (e.g., BA, AB, BS)  Occupational History   Not on file  Tobacco Use   Smoking status: Never   Smokeless tobacco: Never  Vaping Use   Vaping Use: Never used  Substance and Sexual Activity   Alcohol use: Yes    Comment: Occasional 1-2 drinks every couple months   Drug use: No   Sexual activity: Not on file  Other Topics Concern   Not on file  Social History Narrative   BA Degree [employer]   Wears seat belt,    Guns at home    Safe in relationship    Does not exercise    Caffeine: cup of tea daily   Getting married 04/2020 new last name Wiesen   76 month old son Willene Hatchet as of 09/2021    Social Determinants of Health   Financial Resource Strain: Not on file  Food Insecurity: Not on file  Transportation Needs: Not on file  Physical Activity: Not on file  Stress: Not on file  Social Connections: Not on file    Family History  Problem Relation Age of Onset   Hypertension Mother    Migraines Mother    Diabetes Father    Hyperlipidemia Father    Learning disabilities Sister  Cancer Maternal Grandmother        lung (small cell) >liver>pancreas smoker    Migraines Maternal Grandmother    Graves' disease Maternal Grandfather    Miscarriages / Stillbirths Maternal Grandfather    Cancer Maternal Grandfather        prostate/bladder    Ovarian cancer Paternal Grandmother    Cancer Paternal Grandmother        brain   Cancer Paternal Grandfather        skin cancer    Graves' disease Other    Breast cancer Neg Hx    Colon cancer Neg Hx    Heart disease Neg Hx     Outpatient Encounter Medications as of 12/07/2021  Medication Sig   levocetirizine (XYZAL) 5 MG tablet Take 5 mg by mouth daily.   Cholecalciferol 1.25 MG (50000 UT) capsule Take 1 capsule (50,000 Units total) by mouth once a week. D3 1x per week x 6 months then take over the counter vitamin D3 2000 to 4000 IU daily starting month 7   sertraline (ZOLOFT)  50 MG tablet SMARTSIG:1 Tablet(s) By Mouth Daily   SPRINTEC 28 0.25-35 MG-MCG tablet SMARTSIG:1 Tablet(s) By Mouth Daily   [DISCONTINUED] ferrous sulfate 325 (65 FE) MG tablet Take 1 tablet (325 mg total) by mouth daily with breakfast. (Patient not taking: Reported on 10/20/2021)   No facility-administered encounter medications on file as of 12/07/2021.    ALLERGIES: Allergies  Allergen Reactions   Lidocaine Anaphylaxis   Lidocaine-Epinephrine Anaphylaxis   Toradol [Ketorolac Tromethamine] Anaphylaxis and Rash    Throat swelling and feeling of heat over whole body.   Tramadol Anaphylaxis   Latex Rash   Linzess [Linaclotide]     Ab cramps    Tape Rash    VACCINATION STATUS: Immunization History  Administered Date(s) Administered   DTaP 06/01/1993, 07/28/1993, 09/29/1993, 07/03/1994, 04/06/2008   Hepatitis A 10/07/2007, 05/26/2008   Hepatitis A, Adult 10/07/2007, 05/26/2008   Hepatitis B 1990-07-29, 06/01/1993, 09/29/1993   Hepatitis B, adult 05/28/93, 06/01/1993, 09/29/1993, 11/27/2017, 01/01/2018, 06/07/2018   HiB (PRP-OMP) 06/01/1993, 07/28/1993, 09/29/1993, 07/03/1994   IPV 06/01/1993, 07/28/1993, 09/29/1993, 04/06/1994   MMR 07/03/1994, 04/06/1998   PPD Test 04/21/2015   Td 10/07/2007   Tdap 10/07/2007, 01/21/2018, 02/18/2021   Varicella 10/27/1996, 10/07/2007     HPI  Ruth Tran is 31 y.o. female who presents today with a medical history as above. she is being seen in consultation for abnormal thyroid labs requested by McLean-Scocuzza, [name], MD.    She was previously seen by pediatric endocrinology and then [facility] for diagnosis of Graves disease starting at age 44.  She was subsequently given Methimazole and was stable on that medication until she was weaned off in approximately 2015.  Since then she has been in euthyroid state.  She had a baby about 7 months ago and then started noticing different symptoms including weight gain, fatigue, easy  bruising, chronic constipation, hot/cold fluctuations, and brittle nails.    her most recent thyroid labs revealed evidence of hypothyroidism with TSH of 11.99 and FT4 of 0.6 on 10/28/21.  she denies dysphagia, choking, shortness of breath, no recent voice change.    she does have family history of thyroid dysfunction in her grandparents (grandpa had to have RAI for hyperthyroidism and grandma had hyperthyroidism in late adulthood) but denies family hx of thyroid cancer. she denies personal history of goiter. she is not on any anti-thyroid medications nor on any thyroid hormone supplements. Denies use  of Biotin containing supplements.     Review of systems  Constitutional: + steadily increasing body weight, current Body mass index is 28.79 kg/m., + fatigue, hot/cold fluctuations Eyes: no blurry vision, no xerophthalmia ENT: no sore throat, no nodules palpated in throat, no dysphagia/odynophagia, no hoarseness Cardiovascular: no chest pain, no shortness of breath, no palpitations, no leg swelling Respiratory: no cough, no shortness of breath Gastrointestinal: no nausea/vomiting/diarrhea, chronic constipation Musculoskeletal: no muscle/joint aches Skin: no rashes, no hyperemia, brittle nails Neurological: no tremors, no numbness, no tingling, no dizziness Psychiatric: no depression, no anxiety   Objective:    BP 100/62   Pulse 67   Ht 5' 5" (1.651 m)   Wt 173 lb (78.5 kg)   BMI 28.79 kg/m   Wt Readings from Last 3 Encounters:  12/07/21 173 lb (78.5 kg)  10/20/21 171 lb (77.6 kg)  05/13/21 219 lb 9.6 oz (99.6 kg)     BP Readings from Last 3 Encounters:  12/07/21 100/62  10/20/21 110/60  05/15/21 117/75                          Physical Exam- Limited  Constitutional:  Body mass index is 28.79 kg/m. , not in acute distress, normal state of mind Eyes:  EOMI, no exophthalmos Neck: Supple Thyroid: No gross goiter Cardiovascular: RRR, no murmurs, rubs, or gallops, no  edema Respiratory: Adequate breathing efforts, no crackles, rales, rhonchi, or wheezing Musculoskeletal: no gross deformities, strength intact in all four extremities, no gross restriction of joint movements Skin:  no rashes, no hyperemia Neurological: slight tremor with outstretched hands R>L, DTR normal BLE   CMP     Component Value Date/Time   NA 141 10/28/2021 1014   NA 142 12/23/2018 1105   K 4.2 10/28/2021 1014   CL 108 10/28/2021 1014   CO2 28 10/28/2021 1014   GLUCOSE 87 10/28/2021 1014   BUN 11 10/28/2021 1014   BUN 9 12/23/2018 1105   CREATININE 0.71 10/28/2021 1014   CALCIUM 9.0 10/28/2021 1014   PROT 6.5 10/28/2021 1014   PROT 6.2 12/23/2018 1105   ALBUMIN 3.9 10/28/2021 1014   ALBUMIN 4.2 12/23/2018 1105   AST 26 10/28/2021 1014   ALT 15 10/28/2021 1014   ALKPHOS 67 10/28/2021 1014   BILITOT 0.6 10/28/2021 1014   BILITOT 0.6 12/23/2018 1105   GFRNONAA >60 04/25/2021 1335   GFRAA >60 02/16/2019 2151     CBC    Component Value Date/Time   WBC 5.4 10/28/2021 1014   RBC 4.49 10/28/2021 1014   HGB 12.6 10/28/2021 1014   HGB 13.3 12/23/2018 1105   HCT 38.2 10/28/2021 1014   HCT 39.9 12/23/2018 1105   PLT 121.0 (L) 10/28/2021 1014   PLT 178 12/23/2018 1105   MCV 85.0 10/28/2021 1014   MCV 86 12/23/2018 1105   MCH 26.4 05/14/2021 0635   MCHC 32.9 10/28/2021 1014   RDW 16.3 (H) 10/28/2021 1014   RDW 11.7 12/23/2018 1105   LYMPHSABS 2.2 10/28/2021 1014   LYMPHSABS 1.7 12/23/2018 1105   MONOABS 0.5 10/28/2021 1014   EOSABS 0.0 10/28/2021 1014   EOSABS 0.0 12/23/2018 1105   BASOSABS 0.0 10/28/2021 1014   BASOSABS 0.0 12/23/2018 1105     Diabetic Labs (most recent): Lab Results  Component Value Date   HGBA1C 5.4 10/28/2021   HGBA1C 5.1 10/16/2017   HGBA1C 5.4 01/08/2014    Lipid Panel     Component Value  Date/Time   CHOL 213 (H) 10/28/2021 1014   CHOL 190 12/23/2018 1105   TRIG 110.0 10/28/2021 1014   HDL 64.50 10/28/2021 1014   HDL 66  12/23/2018 1105   CHOLHDL 3 10/28/2021 1014   VLDL 22.0 10/28/2021 1014   LDLCALC 126 (H) 10/28/2021 1014   LDLCALC 100 (H) 12/23/2018 1105   LABVLDL 24 12/23/2018 1105     Lab Results  Component Value Date   TSH 11.99 (H) 10/28/2021   TSH 2.99 02/19/2020   TSH 0.192 (L) 12/23/2018   TSH 3.190 11/05/2017   TSH 1.950 10/16/2017   TSH 2.520 04/19/2016   TSH 1.870 04/30/2015   TSH 3.39 11/12/2014   TSH 1.41 05/11/2014   TSH 1.37 05/11/2014   FREET4 0.60 10/28/2021   FREET4 0.74 02/19/2020   FREET4 1.44 12/23/2018   FREET4 1.05 11/05/2017   FREET4 1.09 10/16/2017        Assessment & Plan:   1. Abnormal TSH 2. Hx of Graves' disease  she is being seen at a kind request of McLean-Scocuzza, [name], MD.  She has history of Graves disease in remission, now showing as underactive thyroid.  There are a few potential causes for this drop in thyroid function and additional testing is needed to help determining etiology which will guide treatment options.  I will repeat full profile thyroid function tests today, including antibody testing to help classify her dysfunction.  Will hold off on ordering uptake and scan as her most recent labs were consistent with hypothyroidism.  I did go ahead and order thyroid ultrasound as she has never had one in the past and it can be helpful to assess her baseline anatomy.  If her labs come back again suggestive of overactive thyroid, confirmatory thyroid uptake and scan will be scheduled to be done.   she will return in 3 weeks for treatment decision.     -Patient is advised to maintain close follow up with McLean-Scocuzza, [name], MD for primary care needs.   - Time spent with the patient: 60 minutes, of which >50% was spent in obtaining information about her symptoms, reviewing her previous labs, evaluations, and treatments, counseling her about her hyperthyroidism , and developing a plan to confirm the diagnosis and long term treatment as  necessary. Please refer to "Patient Self Inventory" in the Media tab for reviewed elements of pertinent patient history.  Ruth Tran participated in the discussions, expressed understanding, and voiced agreement with the above plans.  All questions were answered to her satisfaction. she is encouraged to contact clinic should she have any questions or concerns prior to her return visit.   Follow up plan: Return in about 3 weeks (around 12/28/2021) for Thyroid follow up, Previsit labs, thyroid ultrasound.   Thank you for involving me in the care of this pleasant patient, and I will continue to update you with her progress.    Rayetta Pigg, [facility] [address] [place], [place] [address] Phone: [phone] Fax: [phone]  12/07/2021, 3:14 PM

## 2021-12-09 LAB — T3, FREE: T3, Free: 2.7 pg/mL (ref 2.0–4.4)

## 2021-12-09 LAB — THYROID PEROXIDASE ANTIBODY: Thyroperoxidase Ab SerPl-aCnc: 585 IU/mL — ABNORMAL HIGH (ref 0–34)

## 2021-12-09 LAB — TSH: TSH: 4.11 u[IU]/mL (ref 0.450–4.500)

## 2021-12-09 LAB — THYROGLOBULIN ANTIBODY: Thyroglobulin Antibody: <1 IU/mL (ref 0.0–0.9)

## 2021-12-09 LAB — T4, FREE: Free T4: 1 ng/dL (ref 0.82–1.77)

## 2021-12-15 ENCOUNTER — Ambulatory Visit
Admission: RE | Admit: 2021-12-15 | Discharge: 2021-12-15 | Disposition: A | Discharge status: Home / Self Care | Payer: BC Managed Care – PPO | Source: Ambulatory Visit | Attending: Nurse Practitioner | Admitting: Nurse Practitioner

## 2021-12-15 DIAGNOSIS — Z8639 Personal history of other endocrine, nutritional and metabolic disease: Secondary | ICD-10-CM | POA: Diagnosis present

## 2021-12-15 DIAGNOSIS — R7989 Other specified abnormal findings of blood chemistry: Secondary | ICD-10-CM | POA: Insufficient documentation

## 2021-12-18 ENCOUNTER — Other Ambulatory Visit: Payer: Self-pay

## 2021-12-18 ENCOUNTER — Ambulatory Visit
Admission: EM | Admit: 2021-12-18 | Discharge: 2021-12-18 | Disposition: A | Discharge status: Home / Self Care | Payer: BC Managed Care – PPO

## 2021-12-18 ENCOUNTER — Encounter (INDEPENDENT_AMBULATORY_CARE_PROVIDER_SITE_OTHER): Payer: BC Managed Care – PPO | Admitting: Internal Medicine

## 2021-12-18 DIAGNOSIS — J0110 Acute frontal sinusitis, unspecified: Secondary | ICD-10-CM

## 2021-12-18 DIAGNOSIS — H9202 Otalgia, left ear: Secondary | ICD-10-CM | POA: Diagnosis not present

## 2021-12-18 DIAGNOSIS — R0981 Nasal congestion: Secondary | ICD-10-CM | POA: Diagnosis not present

## 2021-12-18 DIAGNOSIS — J069 Acute upper respiratory infection, unspecified: Secondary | ICD-10-CM | POA: Diagnosis not present

## 2021-12-18 NOTE — Discharge Instructions (Addendum)
URI/COLD SYMPTOMS: Your exam today is consistent with a viral illness. Antibiotics are not indicated at this time. Use medications as directed, including cough syrup, nasal saline, and decongestants. Your symptoms should improve over the next few days and resolve within 7-10 days. Increase rest and fluids. F/u if symptoms worsen or predominate such as sore throat, ear pain, productive cough, shortness of breath, or if you develop high fevers or worsening fatigue over the next several days.    

## 2021-12-18 NOTE — ED Triage Notes (Signed)
Pt c/o nasal congestion, left ear pain, sinus pain/pressure. Started last night. She states she has a sinus infection.

## 2021-12-18 NOTE — ED Provider Notes (Signed)
[facility]    CSN: [id number] Arrival date & time: 12/18/21  1321      History   Chief Complaint Chief Complaint  Patient presents with   Nasal Congestion   Otalgia    left    HPI Ruth Tran is a 31 y.o. female presenting for onset of nasal congestion, sinus pressure and left ear pain that began last night.  Patient says she knows that she has a sinus infection.  She has not had any fevers, fatigue, cough, sore throat, chest pain, breathing difficulty.  She has not tried any over-the-counter medications as she says they do not work for her and she always has to have antibiotic.  She says she does not when try the over-the-counter stuff because she knows it will not work.  She denies any sick contacts or known exposure to COVID, flu or strep.  HPI  Past Medical History:  Diagnosis Date   Allergy    Chicken pox    Complication of anesthesia    WITH HER WISDOM TEETH SURGERY SHE HAD TO GO TO THE OR TO HAVE THIS DONE DUE TO TACHYCARDIA DUE TO GRAVES DISEASE   Complication of anesthesia    Dysmenorrhea    Family history of endometriosis    mother,sister   Graves disease    graves, hyperthyroidism    Headache    MIGRAINES   Migraine    Ovarian cyst    Panic attack    UTI (urinary tract infection)     Patient Active Problem List   Diagnosis Date Noted   Abnormal thyroid function test 11/06/2021   Vitamin D deficiency 11/06/2021   Elevated TSH 10/31/2021   Forceps delivery 05/13/2021   Third degree laceration of perineum during delivery, postpartum 05/13/2021   Encounter for supervision of normal pregnancy 09/30/2020   Patellofemoral syndrome of right knee 12/19/2019   Chronic pain of right knee 09/12/2019   Allergic rhinitis 09/12/2019   Ovarian cyst 10/22/2018   Migraine without aura and without status migrainosus, not intractable 09/19/2017   Hyperthyroidism 09/19/2017   Seasonal allergies 09/19/2017   Graves disease 01/22/2014   Vitamin D  deficiency disease 01/22/2014    Past Surgical History:  Procedure Laterality Date   APPENDECTOMY  2005   WITH DRAIN PLACED DUE TO ABSCESS 2005 [facility]   BREAST BIOPSY Right 2017   had a reaction to xylo or xylo with epi where her throat closed up   CHROMOPERTUBATION Right 08/12/2018   Procedure: CHROMOPERTUBATION;  Surgeon: Benjaman Kindler, MD;  Location: [facility];  Service: Gynecology;  Laterality: Right;   FOOT SURGERY Bilateral 2008   toes going outward surgery to turn toes inward 2010    LAPAROSCOPIC OVARIAN CYSTECTOMY Right 08/12/2018   Procedure: LAPAROSCOPIC OVARIAN CYSTECTOMY;  Surgeon: Benjaman Kindler, MD;  Location: [facility];  Service: Gynecology;  Laterality: Right;   LYSIS OF ADHESION Right 08/12/2018   Procedure: LYSIS OF ADHESIONS Lysis of Omental Adhesions, right cecum adhesions, right fallopian tube and right adnexal adhesions;  Surgeon: Benjaman Kindler, MD;  Location: [facility];  Service: Gynecology;  Laterality: Right;   TONSILLECTOMY AND ADENOIDECTOMY N/A 04/17/2017   Procedure: TONSILLECTOMY AND POSSIBLE  ADENOIDECTOMY;  Surgeon: Beverly Gust, MD;  Location: [facility];  Service: ENT;  Laterality: N/A;   WISDOM TOOTH EXTRACTION     2016    OB History     Gravida  1   Para  1   Term  1   Preterm  0  AB  0   Living  1      SAB  0   IAB  0   Ectopic  0   Multiple  0   Live Births  1            Home Medications    Prior to Admission medications   Medication Sig Start Date End Date Taking? Authorizing Provider  levocetirizine (XYZAL) 5 MG tablet Take 5 mg by mouth daily.   Yes [provider]  [place] 28 0.25-35 MG-MCG tablet SMARTSIG:1 Tablet(s) By Mouth Daily 10/05/21  Yes [provider]  Cholecalciferol 1.25 MG (50000 UT) capsule Take 1 capsule (50,000 Units total) by mouth once a week. D3 1x per week x 6 months then take over the counter vitamin D3 2000 to 4000 IU daily starting month 7 11/06/21   McLean-Scocuzza, [name], MD  sertraline (ZOLOFT) 50 MG tablet SMARTSIG:1 Tablet(s) By Mouth Daily 10/07/21   [provider]    Family History Family History  Problem Relation Age of Onset   Hypertension Mother    Migraines Mother    Diabetes Father    Hyperlipidemia Father    Learning disabilities Sister    Cancer Maternal Grandmother        lung (small cell) >liver>pancreas smoker    Migraines Maternal Grandmother    Graves' disease Maternal Grandfather    Miscarriages / Stillbirths Maternal Grandfather    Cancer Maternal Grandfather        prostate/bladder    Ovarian cancer Paternal Grandmother    Cancer Paternal Grandmother        brain   Cancer Paternal Grandfather        skin cancer    Graves' disease Other    Breast cancer Neg Hx    Colon cancer Neg Hx    Heart disease Neg Hx     Social History Social History   Tobacco Use   Smoking status: Never   Smokeless tobacco: Never  Vaping Use   Vaping Use: Never used  Substance Use Topics   Alcohol use: Yes    Comment: Occasional 1-2 drinks every couple months   Drug use: No     Allergies   Lidocaine, Lidocaine-epinephrine, Toradol [ketorolac tromethamine], Tramadol, Latex, Linzess [linaclotide], and Tape   Review of Systems Review of Systems  Constitutional:  Negative for chills, diaphoresis, fatigue and fever.  HENT:  Positive for congestion, ear pain, rhinorrhea, sinus pressure and sinus pain. Negative for sore throat.   Respiratory:  Negative for cough and shortness of breath.   Gastrointestinal:  Negative for abdominal pain, nausea and vomiting.  Musculoskeletal:  Negative for arthralgias and myalgias.  Skin:  Negative for rash.  Neurological:  Negative for weakness and headaches.  Hematological:  Negative for adenopathy.     Physical Exam Triage Vital Signs ED Triage Vitals  Enc Vitals Group     BP 12/18/21 1335 102/67     Pulse Rate 12/18/21 1335 90     Resp 12/18/21 1335 18     Temp 12/18/21 1335 98.1 F  (36.7 C)     Temp Source 12/18/21 1335 Oral     SpO2 12/18/21 1335 100 %     Weight 12/18/21 1333 173 lb 1 oz (78.5 kg)     Height 12/18/21 1333 '5\' 5"'$  (1.651 m)     Head Circumference --      Peak Flow --      Pain Score --  Pain Loc --      Pain Edu? --      Excl. in [place]? --    No data found.  Updated Vital Signs BP 102/67 (BP Location: Left Arm)   Pulse 90   Temp 98.1 F (36.7 C) (Oral)   Resp 18   Ht '5\' 5"'$  (1.651 m)   Wt 173 lb 1 oz (78.5 kg)   LMP 12/13/2021 (Approximate)   SpO2 100%   Breastfeeding No   BMI 28.80 kg/m   Physical Exam Vitals and nursing note reviewed.  Constitutional:      General: She is not in acute distress.    Appearance: Normal appearance. She is ill-appearing. She is not toxic-appearing.  HENT:     Head: Normocephalic and atraumatic.     Right Ear: Ear canal and external ear normal.     Left Ear: Ear canal and external ear normal. A middle ear effusion is present.     Nose: Congestion present.     Mouth/Throat:     Mouth: Mucous membranes are moist.     Pharynx: Oropharynx is clear.  Eyes:     General: No scleral icterus.       Right eye: No discharge.        Left eye: No discharge.     Conjunctiva/sclera: Conjunctivae normal.  Cardiovascular:     Rate and Rhythm: Normal rate and regular rhythm.     Heart sounds: Normal heart sounds.  Pulmonary:     Effort: Pulmonary effort is normal. No respiratory distress.     Breath sounds: Normal breath sounds.  Musculoskeletal:     Cervical back: Neck supple.  Skin:    General: Skin is dry.  Neurological:     General: No focal deficit present.     Mental Status: She is alert. Mental status is at baseline.     Motor: No weakness.     Gait: Gait normal.  Psychiatric:        Mood and Affect: Mood normal.        Behavior: Behavior normal.        Thought Content: Thought content normal.      UC Treatments / Results  Labs (all labs ordered are listed, but only abnormal results are  displayed) Labs Reviewed - No data to display  EKG   Radiology No results found.  Procedures Procedures (including critical care time)  Medications Ordered in UC Medications - No data to display  Initial Impression / Assessment and Plan / UC Course  I have reviewed the triage vital signs and the nursing notes.  Pertinent labs & imaging results that were available during my care of the patient were reviewed by me and considered in my medical decision making (see chart for details).   31 year old female presenting for onset of congestion, sinus pressure and left ear pain last night.  Patient has not tried any over-the-counter medicines for symptoms.  She says they never work and she always has to have antibiotic.  She says she knows that she has a sinus infection.  Vitals normal and stable.  She is mildly ill-appearing but nontoxic.  On exam she has nasal congestion as well as clear effusion of left TM without erythema or bulging.  Chest clear to auscultation and heart regular rate and rhythm.  Discussed with patient that most likely she has a viral URI given that it has been less than 24 hours.  Advised she has not even tried any OTC  meds and I would suggest that she try Mucinex D or Sudafed, Flonase, nasal saline.  Patient becomes angry and states that she needs antibiotic and nothing else.  Again I discussed with her that it is not our protocol to prescribe antibiotics this early and if it has been greater than 7 to 10 days and symptoms are worsening or she develops a fever or signs of pneumonia, ear infection, strep throat or other potential bacterial infection we may consider antibiotics but at this time symptoms are most consistent with a virus.  She becomes very angry and states that this is her husband's only day to bring her here and she has a baby and does not have time to be sick.  Advised patient to follow back up if she is not feeling better in a week to 10 days or if symptoms worsen  or please follow-up with her PCP.  Patient leaves without discharge paperwork.   Final Clinical Impressions(s) / UC Diagnoses   Final diagnoses:  Viral upper respiratory tract infection  Left ear pain     Discharge Instructions      URI/COLD SYMPTOMS: Your exam today is consistent with a viral illness. Antibiotics are not indicated at this time. Use medications as directed, including cough syrup, nasal saline, and decongestants. Your symptoms should improve over the next few days and resolve within 7-10 days. Increase rest and fluids. F/u if symptoms worsen or predominate such as sore throat, ear pain, productive cough, shortness of breath, or if you develop high fevers or worsening fatigue over the next several days.       ED Prescriptions   None    PDMP not reviewed this encounter.   Danton Clap, PA-C 12/18/21 1410

## 2021-12-19 MED ORDER — AMOXICILLIN-POT CLAVULANATE 875-125 MG PO TABS: 1.0000 | ORAL_TABLET | Freq: Two times a day (BID) | ORAL | 0 refills | Status: DC

## 2021-12-19 NOTE — Telephone Encounter (Signed)
My chart message   I normally eat yogurts while taking antibiotics to avoid yeast infections. I'm okay with the fee. I greatly appreciate it. Thank you so much     You  Ruth Tran 1 hour ago (7:09 PM)   TM Hi  Sorry for this  If you are agreeable to a my chart fee yes I can do this zpack or Augmentin if not allergic and also do you get yeast infections with antibiotics?  You can do nasal saline, flonase, zyrtec or claritin or xyzal or allegra all over the counter as well +tylenol for headache/pain and if you want to take a multivitamin for now with lunch or dinner ok NOT Breakfast       Ruth Tran, [place] C, CMA routed conversation to You 12 hours ago (7:56 AM)   Ruth Tran  P [facility] (supporting You) Yesterday (3:09 PM)    Hi Dr. [name],   I went to urgent care today (12/18/20) for sinus issues and ear pain. The PA did not want to listen to anything I had to say and quickly shut me out. I'm having sinus pressure and pain, green mucus coming out, headache, and left ear pain. She did say I had fluid behind my left ear. I cannot take otc medicines because of my thyroid and they also make me have bad heart palpitations. I was wondering if you could call me in something to help fight it. In the past, I've always had to have a antibiotic to help fight sinus issues. The PA stated I couldn't have any antibiotics for 24 hours and then changed her story to 10 days. The last sinus infection I had in 2022, I had to get a prescription to help fight it because it turned really bad in less than 72 hours. Thank you so much. If I need to come in for you to check me out, I can.    A/p  Sinusitis  Augmentin bid x 1 week Supportive care  Pt agreeable to fee   Time spent 5 min

## 2021-12-21 ENCOUNTER — Encounter: Payer: Self-pay | Admitting: Physical Therapy

## 2021-12-21 ENCOUNTER — Ambulatory Visit: Payer: BC Managed Care – PPO | Attending: Certified Nurse Midwife | Admitting: Physical Therapy

## 2021-12-21 DIAGNOSIS — R278 Other lack of coordination: Secondary | ICD-10-CM | POA: Insufficient documentation

## 2021-12-21 DIAGNOSIS — M62838 Other muscle spasm: Secondary | ICD-10-CM | POA: Diagnosis present

## 2021-12-21 DIAGNOSIS — R102 Pelvic and perineal pain: Secondary | ICD-10-CM | POA: Diagnosis not present

## 2021-12-21 NOTE — Therapy (Signed)
OUTPATIENT PHYSICAL THERAPY TREATMENT NOTE   Patient Name: Ruth Tran MRN: [id number] DOB:[birth year], 31 y.o., female Today's Date: 12/21/2021  PCP: McLean-Scocuzza, [name], MD REFERRING PROVIDER: Gertie Fey,*   PT End of Session - 12/21/21 1432     Visit Number 10    Number of Visits 24    Date for PT Re-Evaluation 02/15/22    Authorization Type IE 08/22/2021    PT Start Time 1430    PT Stop Time 1510    PT Time Calculation (min) 40 min    Activity Tolerance Patient tolerated treatment well    Behavior During Therapy WFL for tasks assessed/performed             Past Medical History:  Diagnosis Date   Allergy    Chicken pox    Complication of anesthesia    WITH HER WISDOM TEETH SURGERY SHE HAD TO GO TO THE OR TO HAVE THIS DONE DUE TO TACHYCARDIA DUE TO GRAVES DISEASE   Complication of anesthesia    Dysmenorrhea    Family history of endometriosis    mother,sister   Graves disease    graves, hyperthyroidism    Headache    MIGRAINES   Migraine    Ovarian cyst    Panic attack    UTI (urinary tract infection)    Past Surgical History:  Procedure Laterality Date   APPENDECTOMY  2005   WITH DRAIN PLACED DUE TO ABSCESS 2005 [facility]   BREAST BIOPSY Right 2017   had a reaction to xylo or xylo with epi where her throat closed up   CHROMOPERTUBATION Right 08/12/2018   Procedure: CHROMOPERTUBATION;  Surgeon: Benjaman Kindler, MD;  Location: [facility];  Service: Gynecology;  Laterality: Right;   FOOT SURGERY Bilateral 2008   toes going outward surgery to turn toes inward 2010    LAPAROSCOPIC OVARIAN CYSTECTOMY Right 08/12/2018   Procedure: LAPAROSCOPIC OVARIAN CYSTECTOMY;  Surgeon: Benjaman Kindler, MD;  Location: [facility];  Service: Gynecology;  Laterality: Right;   LYSIS OF ADHESION Right 08/12/2018   Procedure: LYSIS OF ADHESIONS Lysis of Omental Adhesions, right cecum adhesions, right fallopian tube and right adnexal adhesions;  Surgeon: Benjaman Kindler,  MD;  Location: [facility];  Service: Gynecology;  Laterality: Right;   TONSILLECTOMY AND ADENOIDECTOMY N/A 04/17/2017   Procedure: TONSILLECTOMY AND POSSIBLE  ADENOIDECTOMY;  Surgeon: Beverly Gust, MD;  Location: [facility];  Service: ENT;  Laterality: N/A;   [place] EXTRACTION     2016   Patient Active Problem List   Diagnosis Date Noted   Abnormal thyroid function test 11/06/2021   Vitamin D deficiency 11/06/2021   Elevated TSH 10/31/2021   Forceps delivery 05/13/2021   Third degree laceration of perineum during delivery, postpartum 05/13/2021   Encounter for supervision of normal pregnancy 09/30/2020   Patellofemoral syndrome of right knee 12/19/2019   Chronic pain of right knee 09/12/2019   Allergic rhinitis 09/12/2019   Ovarian cyst 10/22/2018   Migraine without aura and without status migrainosus, not intractable 09/19/2017   Hyperthyroidism 09/19/2017   Seasonal allergies 09/19/2017   Graves disease 01/22/2014   Vitamin D deficiency disease 01/22/2014    REFERRING DIAG: R10.2 (ICD-10-CM) - Pelvic and perineal pain  THERAPY DIAG:  Pelvic pain  Other lack of coordination  Other muscle spasm  PERTINENT HISTORY: Third degree perineal tear, 04/2021  PRECAUTIONS: None  SUBJECTIVE: Patient reports that [place] the beach she was able to wear tampon for up to 6 hours. Patient  notes that symptoms overall are under good control and has not had any really bad painful days. However, patient has had new onset of intermittent sharp pain between clitoris and urethra which will then radiate to inner thighs and and superior portion of anterior thigh; radiating symptoms are numbness. Patient denies saddle anesthesia and notes no changes to bowel or bladder.   PAIN:  Are you having pain? Yes: NPRS scale: 0/10 Pain location:   Pain description:    TREATMENT  09/05/2021: INTERNAL VAGINAL EXAM: Patient educated on the purpose of the pelvic exam and articulated understanding;  patient consented to the exam verbally. Introitus Appears: WNL Skin integrity: no apparent erythema, edema, or broken skin Scar mobility: significantly restricted on L from 3 o'clock to 6 o'clock Strength (PERF): deferred Symmetry: increased perineal tone B, bulbocavernosus R more tense than L Palpation: TTP of scar, + Tinel's test on L at pudendal nerve Prolapse: not formally assessed  (0 no contraction, 1 flicker, 2 weak squeeze and no lift, 3 fair squeeze and definite lift, 4 good squeeze and lift against resistance, 5 strong squeeze against strong resistance)  12/21/2021: Manual Therapy: STM and TPR performed to L gluteal mm and L OI mm to allow for decreased tension and pain and improved posture and function TPR/ L OI mobilizations with rotational movement for decreased spasm and improved pain Patient education on self-release with golf ball/lacrosse ball with handout.   Neuromuscular Re-education: Patient education on body mechanics when carrying infant for decreased asymmetry of pelvis and thereby muscular load contributing to compression/aggravation of pudendal n.   Therapeutic Exercise:   Treatments unbilled:  Post-treatment assessment:  Patient educated throughout session on appropriate technique and form using multi-modal cueing, HEP, and activity modification. Patient articulated understanding and returned demonstration.  Patient Response to interventions: Comfortable to return as needed  ASSESSMENT Patient presents to clinic with excellent motivation to participate in therapy. Patient demonstrates deficits in posture, PFM coordination, and pain. Deep palpation of Alcock's canal reproduced concordant pain and TPR of OI with hip rotation/logroll was useful in relieving tension. Patient responded well to educational interventions on self-release of OI mm and body mechanics for improved pelvic symmetry. Patient may benefit from continued skilled therapeutic intervention to  address remaining deficits in posture, PFM coordination, and pain in order to increase function and improve overall QOL.   Objective impairments: decreased activity tolerance, decreased coordination, decreased strength, hypomobility, increased fascial restrictions, increased muscle spasms, impaired flexibility, improper body mechanics, postural dysfunction, and pain.   Activity limitations: community activity, driving, meal prep, occupation, and shopping.   Personal factors: Behavior pattern, Past/current experiences, and 3+ comorbidities: postpartum depression, hyperthyroidism, migraines, Vit D deficiency,  are also affecting patient's functional outcome.   Rehab Potential: Good  Clinical decision making: Evolving/moderate complexity  Evaluation complexity: Moderate   GOALS: Goals reviewed with patient? Yes  SHORT TERM GOALS: Target date: 10/03/2021  Patient will demonstrate independence with HEP in order to maximize therapeutic gains and improve carryover from physical therapy sessions to ADLs in the home and community. Baseline: not formally assessed; 6/28: IND Goal status: ACHIEVED  Patient will demonstrate independent and coordinated diaphragmatic breathing in supine with a 1:2 breathing pattern for improved down-regulation of the nervous system and improved management of intra-abdominal pressures in order to increase function at home and in the community. Baseline: not formally assessed; 6/28: IND Goal status: ACHIEVED   LONG TERM GOALS: Target date: 11/14/2021  Patient will demonstrate improved function as evidenced by a score  of <25 on FOTO measure for full participation in activities at home and in the community. Baseline: 58; 6/28: 29 Goal status: ON-GOING  Patient will decrease worst pain as reported on NPRS by at least 2 points to demonstrate clinically significant reduction in pain in order to restore/improve function and overall QOL. Baseline: 9/10; 6/28: 5.5/10  (vigorous cleaning and stress) Goal status: ON-GOING  Patient will demonstrate circumferential and sequential contraction of >4/5 MMT, > 6 sec hold x10 and 5 consecutive quick flicks with </= 10 min rest between testing bouts, and relaxation of the PFM coordinated with breath for improved management of intra-abdominal pressure and normal bowel and bladder function without the presence of pain nor incontinence in order to improve participation at home and in the community. Baseline: not formally assessed; 6/28: 2/5 x 1 rep subsequent reps had hip adductor compensations Goal status: ON-GOING    PLAN: Rehab frequency: 1x/week  Rehab duration: 12 weeks  Planned interventions: Therapeutic exercises, Therapeutic activity, Neuromuscular re-education, Balance training, Gait training, Patient/Family education, Joint mobilization, Orthotic/Fit training, Electrical stimulation, Spinal manipulation, Spinal mobilization, Cryotherapy, scar mobilization, Taping, and Manual therapy   Myles Gip PT, DPT [phone]  12/21/2021, 2:34 PM

## 2021-12-28 ENCOUNTER — Telehealth (INDEPENDENT_AMBULATORY_CARE_PROVIDER_SITE_OTHER): Payer: BC Managed Care – PPO | Admitting: Nurse Practitioner

## 2021-12-28 ENCOUNTER — Encounter: Payer: Self-pay | Admitting: Nurse Practitioner

## 2021-12-28 DIAGNOSIS — E039 Hypothyroidism, unspecified: Secondary | ICD-10-CM | POA: Diagnosis not present

## 2021-12-28 DIAGNOSIS — E041 Nontoxic single thyroid nodule: Secondary | ICD-10-CM

## 2021-12-28 DIAGNOSIS — R768 Other specified abnormal immunological findings in serum: Secondary | ICD-10-CM

## 2021-12-28 DIAGNOSIS — Z8639 Personal history of other endocrine, nutritional and metabolic disease: Secondary | ICD-10-CM

## 2021-12-28 NOTE — Progress Notes (Signed)
12/28/2021     Endocrinology Follow Up Note    TELEHEALTH VISIT: The patient is being engaged in telehealth visit due to COVID-19.  This type of visit limits physical examination significantly, and thus is not preferable over face-to-face encounters.  I connected with  Ruth Tran on 12/28/21 by a video enabled telemedicine application and verified that I am speaking with the correct person using two identifiers.   I discussed the limitations of evaluation and management by telemedicine. The patient expressed understanding and agreed to proceed.    The participants involved in this visit include: Brita Romp, NP located at [facility] and Ruth Tran  located at their personal residence listed.  Subjective:    Patient ID: Ruth Tran, female    DOB: 08-15-1990, PCP McLean-Scocuzza, [name], MD.   Past Medical History:  Diagnosis Date   Allergy    Chicken pox    Complication of anesthesia    WITH HER WISDOM TEETH SURGERY SHE HAD TO GO TO THE OR TO HAVE THIS DONE DUE TO TACHYCARDIA DUE TO GRAVES DISEASE   Complication of anesthesia    Dysmenorrhea    Family history of endometriosis    mother,sister   Graves disease    graves, hyperthyroidism    Headache    MIGRAINES   Migraine    Ovarian cyst    Panic attack    UTI (urinary tract infection)     Past Surgical History:  Procedure Laterality Date   APPENDECTOMY  2005   WITH DRAIN PLACED DUE TO ABSCESS 2005 [facility]   BREAST BIOPSY Right 2017   had a reaction to xylo or xylo with epi where her throat closed up   CHROMOPERTUBATION Right 08/12/2018   Procedure: CHROMOPERTUBATION;  Surgeon: Benjaman Kindler, MD;  Location: [facility] ORS;  Service: Gynecology;  Laterality: Right;   FOOT SURGERY Bilateral 2008   toes going outward surgery to turn toes inward 2010    LAPAROSCOPIC OVARIAN CYSTECTOMY Right 08/12/2018   Procedure: LAPAROSCOPIC OVARIAN CYSTECTOMY;  Surgeon: Benjaman Kindler,  MD;  Location: [facility] OR[facility];  Service: Gynecology;  Laterality: Right;   LYSIS OF ADHESION Right 08/12/2018   Procedure: LYSIS OF ADHESIONS Lysis of Omental Adhesions, right cecum adhesions, right fallopian tube and right adnexal adhesions;  Surgeon: Benjaman Kindler, MD;  Location: [facility] ORS;  Service: Gynecology;  Laterality: Right;   TONSILLECTOMY AND ADENOIDECTOMY N/A 04/17/2017   Procedure: TONSILLECTOMY AND POSSIBLE  ADENOIDECTOMY;  Surgeon: Beverly Gust, MD;  Location: [facility] ORS;  Service: ENT;  Laterality: N/A;   WISDOM TOOTH EXTRACTION     2016    Social History   Socioeconomic History   Marital status: Married    Spouse name: Not on file   Number of children: 0   Years of education: Not on file   Highest education level: Bachelor's degree (e.g., BA, AB, BS)  Occupational History   Not on file  Tobacco Use   Smoking status: Never   Smokeless tobacco: Never  Vaping Use   Vaping Use: Never used  Substance and Sexual Activity   Alcohol use: Yes    Comment: Occasional 1-2 drinks every couple months   Drug use: No   Sexual activity: Not on file  Other Topics Concern   Not on file  Social History Narrative   BA Degree [employer]   Wears seat belt,    Guns at home    Safe in relationship    Does not  exercise    Caffeine: cup of tea daily   Getting married 04/2020 new last name Imhof   74 month old son Willene Hatchet as of 09/2021    Social Determinants of Health   Financial Resource Strain: Not on file  Food Insecurity: Not on file  Transportation Needs: Not on file  Physical Activity: Not on file  Stress: Not on file  Social Connections: Not on file    Family History  Problem Relation Age of Onset   Hypertension Mother    Migraines Mother    Diabetes Father    Hyperlipidemia Father    Learning disabilities Sister    Cancer Maternal Grandmother        lung (small cell) >liver>pancreas smoker    Migraines Maternal Grandmother    Graves' disease Maternal Grandfather     Miscarriages / Stillbirths Maternal Grandfather    Cancer Maternal Grandfather        prostate/bladder    Ovarian cancer Paternal Grandmother    Cancer Paternal Grandmother        brain   Cancer Paternal Grandfather        skin cancer    Graves' disease Other    Breast cancer Neg Hx    Colon cancer Neg Hx    Heart disease Neg Hx     Outpatient Encounter Medications as of 12/28/2021  Medication Sig   amoxicillin-clavulanate (AUGMENTIN) 875-125 MG tablet Take 1 tablet by mouth 2 (two) times daily. With food   Cholecalciferol 1.25 MG (50000 UT) capsule Take 1 capsule (50,000 Units total) by mouth once a week. D3 1x per week x 6 months then take over the counter vitamin D3 2000 to 4000 IU daily starting month 7   levocetirizine (XYZAL) 5 MG tablet Take 5 mg by mouth daily.   SPRINTEC 28 0.25-35 MG-MCG tablet SMARTSIG:1 Tablet(s) By Mouth Daily   [DISCONTINUED] sertraline (ZOLOFT) 50 MG tablet SMARTSIG:1 Tablet(s) By Mouth Daily   No facility-administered encounter medications on file as of 12/28/2021.    ALLERGIES: Allergies  Allergen Reactions   Lidocaine Anaphylaxis   Lidocaine-Epinephrine Anaphylaxis   Toradol [Ketorolac Tromethamine] Anaphylaxis and Rash    Throat swelling and feeling of heat over whole body.   Tramadol Anaphylaxis   Latex Rash   Linzess [Linaclotide]     Ab cramps    Tape Rash    VACCINATION STATUS: Immunization History  Administered Date(s) Administered   DTaP 06/01/1993, 07/28/1993, 09/29/1993, 07/03/1994, 04/06/2008   Hepatitis A 10/07/2007, 05/26/2008   Hepatitis A, Adult 10/07/2007, 05/26/2008   Hepatitis B 09-Apr-1993, 06/01/1993, 09/29/1993   Hepatitis B, adult 07-29-1992, 06/01/1993, 09/29/1993, 11/27/2017, 01/01/2018, 06/07/2018   HiB (PRP-OMP) 06/01/1993, 07/28/1993, 09/29/1993, 07/03/1994   IPV 06/01/1993, 07/28/1993, 09/29/1993, 04/06/1994   MMR 07/03/1994, 04/06/1998   PPD Test 04/21/2015   Td 10/07/2007   Tdap 10/07/2007, 01/21/2018,  02/18/2021   Varicella 10/27/1996, 10/07/2007     HPI  Ruth Tran is 31 y.o. female who presents today with a medical history as above. she is being seen in follow up after being seen in consultation for abnormal thyroid labs requested by McLean-Scocuzza, [name], MD.    She was previously seen by pediatric endocrinology and then [facility] for diagnosis of Graves disease starting at age 27.  She was subsequently given Methimazole and was stable on that medication until she was weaned off in approximately 2015.  Since then she has been in euthyroid state.  She had a baby about 7 months ago  and then started noticing different symptoms including weight gain, fatigue, easy bruising, chronic constipation, hot/cold fluctuations, and brittle nails.    her most recent thyroid labs revealed evidence of hypothyroidism with TSH of 11.99 and FT4 of 0.6 on 10/28/21.  she denies dysphagia, choking, shortness of breath, no recent voice change.    she does have family history of thyroid dysfunction in her grandparents (grandpa had to have RAI for hyperthyroidism and grandma had hyperthyroidism in late adulthood) but denies family hx of thyroid cancer. she denies personal history of goiter. she is not on any anti-thyroid medications nor on any thyroid hormone supplements. Denies use of Biotin containing supplements.     Review of systems  Constitutional: + fluctuating body weight, current There is no height or weight on file to calculate BMI., + fatigue- worse, hot/cold fluctuations Eyes: no blurry vision, no xerophthalmia ENT: no sore throat, no nodules palpated in throat, no dysphagia/odynophagia, no hoarseness Cardiovascular: no chest pain, no shortness of breath, no palpitations, no leg swelling Respiratory: no cough, no shortness of breath Gastrointestinal: no nausea/vomiting/diarrhea, chronic constipation Musculoskeletal: no muscle/joint aches Skin: no rashes, no hyperemia, brittle  nails Neurological: no tremors, no numbness, no tingling, no dizziness Psychiatric: no depression, no anxiety   Objective:    LMP 12/13/2021 (Approximate)   Wt Readings from Last 3 Encounters:  12/18/21 173 lb 1 oz (78.5 kg)  12/07/21 173 lb (78.5 kg)  10/20/21 171 lb (77.6 kg)     BP Readings from Last 3 Encounters:  12/18/21 102/67  12/07/21 100/62  10/20/21 110/60    Physical Exam- Telehealth- significantly limited due to nature of visit  Constitutional: There is no height or weight on file to calculate BMI. , not in acute distress, normal state of mind Respiratory: Adequate breathing efforts   CMP     Component Value Date/Time   NA 141 10/28/2021 1014   NA 142 12/23/2018 1105   K 4.2 10/28/2021 1014   CL 108 10/28/2021 1014   CO2 28 10/28/2021 1014   GLUCOSE 87 10/28/2021 1014   BUN 11 10/28/2021 1014   BUN 9 12/23/2018 1105   CREATININE 0.71 10/28/2021 1014   CALCIUM 9.0 10/28/2021 1014   PROT 6.5 10/28/2021 1014   PROT 6.2 12/23/2018 1105   ALBUMIN 3.9 10/28/2021 1014   ALBUMIN 4.2 12/23/2018 1105   AST 26 10/28/2021 1014   ALT 15 10/28/2021 1014   ALKPHOS 67 10/28/2021 1014   BILITOT 0.6 10/28/2021 1014   BILITOT 0.6 12/23/2018 1105   GFRNONAA >60 04/25/2021 1335   GFRAA >60 02/16/2019 2151     CBC    Component Value Date/Time   WBC 5.4 10/28/2021 1014   RBC 4.49 10/28/2021 1014   HGB 12.6 10/28/2021 1014   HGB 13.3 12/23/2018 1105   HCT 38.2 10/28/2021 1014   HCT 39.9 12/23/2018 1105   PLT 121.0 (L) 10/28/2021 1014   PLT 178 12/23/2018 1105   MCV 85.0 10/28/2021 1014   MCV 86 12/23/2018 1105   MCH 26.4 05/14/2021 0635   MCHC 32.9 10/28/2021 1014   RDW 16.3 (H) 10/28/2021 1014   RDW 11.7 12/23/2018 1105   LYMPHSABS 2.2 10/28/2021 1014   LYMPHSABS 1.7 12/23/2018 1105   MONOABS 0.5 10/28/2021 1014   EOSABS 0.0 10/28/2021 1014   EOSABS 0.0 12/23/2018 1105   BASOSABS 0.0 10/28/2021 1014   BASOSABS 0.0 12/23/2018 1105     Diabetic Labs  (most recent): Lab Results  Component Value Date  HGBA1C 5.4 10/28/2021   HGBA1C 5.1 10/16/2017   HGBA1C 5.4 01/08/2014    Lipid Panel     Component Value Date/Time   CHOL 213 (H) 10/28/2021 1014   CHOL 190 12/23/2018 1105   TRIG 110.0 10/28/2021 1014   HDL 64.50 10/28/2021 1014   HDL 66 12/23/2018 1105   CHOLHDL 3 10/28/2021 1014   VLDL 22.0 10/28/2021 1014   LDLCALC 126 (H) 10/28/2021 1014   LDLCALC 100 (H) 12/23/2018 1105   LABVLDL 24 12/23/2018 1105     Lab Results  Component Value Date   TSH 4.110 12/07/2021   TSH 11.99 (H) 10/28/2021   TSH 2.99 02/19/2020   TSH 0.192 (L) 12/23/2018   TSH 3.190 11/05/2017   TSH 1.950 10/16/2017   TSH 2.520 04/19/2016   TSH 1.870 04/30/2015   TSH 3.39 11/12/2014   TSH 1.41 05/11/2014   TSH 1.37 05/11/2014   FREET4 1.00 12/07/2021   FREET4 0.60 10/28/2021   FREET4 0.74 02/19/2020   FREET4 1.44 12/23/2018   FREET4 1.05 11/05/2017   FREET4 1.09 10/16/2017    Thyroid US from 12/15/21 CLINICAL DATA:  Abnormal thyroid lab values   EXAM: THYROID ULTRASOUND   TECHNIQUE: Ultrasound examination of the thyroid gland and adjacent soft tissues was performed.   COMPARISON:  None Available.   FINDINGS: Parenchymal Echotexture: Moderately heterogeneous   Isthmus: 0.7 cm   Right lobe: 6.0 x 1.4 x 1.9 cm   Left lobe: 4.9 x 1.3 x 1.6 cm   _________________________________________________________   Estimated total number of nodules >/= 1 cm: 1   Number of spongiform nodules >/=  2 cm not described below (TR1): 0   Number of mixed cystic and solid nodules >/= 1.5 cm not described below (TR2): 0   _________________________________________________________   Nodule # 1:   Location: Right; inferior   Maximum size: 1.8 cm; Other 2 dimensions: 1.0 x 0.5 cm   Composition: solid/almost completely solid (2)   Echogenicity: hypoechoic (2)   Shape: not taller-than-wide (0)   Margins: ill-defined (0)   Echogenic foci: none  (0)   ACR TI-RADS total points: 4.   ACR TI-RADS risk category: TR4 (4-6 points).   ACR TI-RADS recommendations:   **Given size (>/= 1.5 cm) and appearance, fine needle aspiration of this moderately suspicious nodule should be considered based on TI-RADS criteria.   IMPRESSION: Nodule 1 (TI-RADS 4), measuring 1.8 cm, located in the inferior right thyroid lobe, meets criteria for FNA.   The above is in keeping with the ACR TI-RADS recommendations - J Am Coll Radiol 2017;14:587-595.     Electronically Signed   By: Miachel Roux M.D.   On: 12/15/2021 11:55  Latest Reference Range & Units 11/05/17 09:43 12/23/18 11:05 02/19/20 09:01 10/28/21 10:14 12/07/21 14:48  TSH 0.450 - 4.500 uIU/mL 3.190 0.192 (L) 2.99 11.99 (H) 4.110  Triiodothyronine,Free,Serum 2.0 - 4.4 pg/mL     2.7  T4,Free(Direct) 0.82 - 1.77 ng/dL 1.05 1.44 0.74 0.60 1.00  Thyroperoxidase Ab SerPl-aCnc 0 - 34 IU/mL     585 (H)  Thyroglobulin Antibody 0.0 - 0.9 IU/mL     <1.0  T3 Uptake Ratio 24 - 39 %  21 (L)     (L): Data is abnormally low (H): Data is abnormally high   Assessment & Plan:   1. Abnormal TSH 2. Hx of Graves' disease 3. Thyroid nodule  she is being seen at a kind request of McLean-Scocuzza, [name], MD.  She has history of  Graves disease in remission, now showing as underactive thyroid.  There are a few potential causes for this drop in thyroid function and additional testing is needed to help determining etiology which will guide treatment options.  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  She is at a genetic predisposition of developing ongoing problems with her thyroid.  She is not at a point where anti-thyroid treatment or thyroid hormone replacement is needed.  Will monitor thyroid function tests again in another 3 months for surveillance.  Her [place] did show  moderately suspicious thyroid nodule in right inferior gland which recommends FNA to rule out malignancy.  I discussed this with her and she agreed to proceed.       -Patient is advised to maintain close follow up with McLean-Scocuzza, [name], MD for primary care needs.     I spent 20 minutes dedicated to the care of this patient on the date of this encounter to include pre-visit review of records, face-to-face time with the patient, and post visit ordering of testing.  Ruth Tran participated in the discussions, expressed understanding, and voiced agreement with the above plans.  All questions were answered to her satisfaction. she is encouraged to contact clinic should she have any questions or concerns prior to her return visit.   Follow up plan: Return in about 3 months (around 03/30/2022) for Thyroid follow up, Previsit labs; biopsy.   Thank you for involving me in the care of this pleasant patient, and I will continue to update you with her progress.   Rayetta Pigg, [facility], [facility] [address] [place], [place] [address] Phone: [phone] Fax: [phone]  12/28/2021, 2:45 PM

## 2021-12-28 NOTE — Patient Instructions (Signed)
Thyroid Nodule  A thyroid nodule is an isolated growth of thyroid cells that forms a lump in the thyroid gland. The thyroid gland is a butterfly-shaped gland found in the lower front of the neck. It sends chemical messengers (hormones) through the blood to all parts of the body. These hormones are important in regulating body temperature and helping the body use energy. Thyroid nodules are common. Most are not cancerous (are benign). You may have one nodule or several nodules. There are different types of thyroid nodules. They include nodules that: Grow and fill with fluid (thyroid cysts). Produce too much thyroid hormone (hot nodules or hyperthyroid). Produce no thyroid hormone (cold nodules or hypothyroid). Form from cancer cells (thyroid cancers). What are the causes? In most cases, the cause of thyroid nodules is not known. What increases the risk? The following factors may make you more likely to develop thyroid nodules: Age. Thyroid nodules are more common in people who are older than 45 years. Female gender. A family history that includes: Thyroid nodules. Pheochromocytoma. Thyroid carcinoma. Hyperparathyroidism. Certain thyroid diseases, such as Hashimoto's thyroiditis. Lack of iodine in your diet. A history of head and neck radiation, such as from cancer treatments. Type 2 diabetes. What are the signs or symptoms? In many cases, there are no symptoms. If you have symptoms, they may include: A lump in your lower neck. Feeling pressure, fullness, or a tickle in your throat. Pain in your neck, jaw, or ear. Having trouble swallowing or breathing. Hot nodules may cause: Weight loss. Warm, flushed skin. Feeling hot. Feeling nervous. A rapid or irregular heartbeat. Cold nodules may cause: Weight gain. Dry skin. Hair loss, brittle hair, or both. Feeling cold. Fatigue. Thyroid cancer nodules may cause: Hard nodules that can be felt along the thyroid  gland. Hoarseness. Lumps in the tissue (lymph nodes) near your thyroid gland. How is this diagnosed? A thyroid nodule may be felt by your health care provider during a physical exam. This condition may also be diagnosed based on your symptoms. You may also have tests, including: Blood tests to check how well your thyroid is working. An ultrasound. This may be done to confirm the diagnosis. A biopsy. This involves taking a sample from the nodule and looking at it under a microscope. A thyroid scan. This test creates an image of the thyroid gland using a radioactive tracer. Imaging tests such as an MRI or CT scan. These may be done if: A nodule is large. A nodule is blocking your airway. Cancer is suspected. How is this treated? Treatment depends on the cause and size of your nodule or nodules. If a nodule is benign, treatment may not be necessary. Your health care provider may monitor the nodule to see if it goes away without treatment. If a nodule continues to grow, is cancerous, or does not go away, treatment may be needed. Treatment may include: Having a cystic nodule drained with a needle. Ablation therapy. In this treatment, alcohol is injected into the area of the nodule to destroy the cells. Ablation with heat may also be used. This is called thermal ablation. Radioactive iodine. In this treatment, radioactive iodine is given as a pill or liquid that you drink. This substance causes the thyroid nodule to shrink. Surgery to remove the nodule or nodules. Part or all of your thyroid gland may also need to be removed. Medicines to treat hyperthyroidism. Follow these instructions at home: Pay attention to any changes in your thyroid nodule or nodules. Take over-the-counter   and prescription medicines only as told by your health care provider. Keep all follow-up visits. This is important. Contact a health care provider if: You have trouble sleeping. You have muscle weakness. You have  significant weight loss without changing your eating habits. You feel nervous. You have trouble swallowing. You have increased swelling. You have a rapid or irregular heartbeat. Get help right away if: You have chest pain. You faint or lose consciousness. Your nodule makes it hard for you to breathe. These symptoms may be an emergency. Get help right away. Call 911. Do not wait to see if the symptoms will go away. Do not drive yourself to the hospital. Summary A thyroid nodule is an isolated growth of thyroid cells that forms a lump in your thyroid gland. Thyroid nodules are common. Most are not cancerous. Your health care provider may monitor the nodule to see if it goes away without treatment. If a nodule continues to grow, is cancerous, or does not go away, treatment may be needed. Treatment depends on the cause and size of your nodule or nodules. This information is not intended to replace advice given to you by your health care provider. Make sure you discuss any questions you have with your health care provider. Document Revised: 03/28/2021 Document Reviewed: 03/28/2021 Elsevier Patient Education  2023 Elsevier Inc.  

## 2022-01-06 ENCOUNTER — Ambulatory Visit
Admission: RE | Admit: 2022-01-06 | Discharge: 2022-01-06 | Disposition: A | Discharge status: Home / Self Care | Payer: BC Managed Care – PPO | Source: Ambulatory Visit | Attending: Nurse Practitioner | Admitting: Nurse Practitioner

## 2022-01-06 VITALS — BP 105/60

## 2022-01-06 DIAGNOSIS — E041 Nontoxic single thyroid nodule: Secondary | ICD-10-CM | POA: Insufficient documentation

## 2022-01-06 DIAGNOSIS — E063 Autoimmune thyroiditis: Secondary | ICD-10-CM | POA: Diagnosis not present

## 2022-01-06 DIAGNOSIS — R946 Abnormal results of thyroid function studies: Secondary | ICD-10-CM | POA: Diagnosis present

## 2022-01-06 DIAGNOSIS — E0500 Thyrotoxicosis with diffuse goiter without thyrotoxic crisis or storm: Secondary | ICD-10-CM | POA: Insufficient documentation

## 2022-01-06 MED ORDER — TETRACAINE HCL 1 % IJ SOLN
40.0000 mg | Freq: Once | INTRAMUSCULAR | Status: AC
  Administered 2022-01-06: 2 mL via INTRADERMAL
  Filled 2022-01-06: qty 4

## 2022-01-06 MED ORDER — TETRACAINE HCL 1 % IJ SOLN
20.0000 mg | Freq: Once | INTRAMUSCULAR | Status: AC
  Administered 2022-01-06: 2 mL via INTRADERMAL
  Filled 2022-01-06: qty 2

## 2022-01-09 ENCOUNTER — Encounter: Payer: Self-pay | Admitting: Nurse Practitioner

## 2022-01-10 LAB — CYTOLOGY - NON PAP

## 2022-01-16 ENCOUNTER — Encounter: Payer: Self-pay | Admitting: Nurse Practitioner

## 2022-02-27 ENCOUNTER — Encounter: Payer: Self-pay | Admitting: Nurse Practitioner

## 2022-02-28 ENCOUNTER — Telehealth: Payer: BC Managed Care – PPO | Admitting: Physician Assistant

## 2022-02-28 DIAGNOSIS — B9689 Other specified bacterial agents as the cause of diseases classified elsewhere: Secondary | ICD-10-CM | POA: Diagnosis not present

## 2022-02-28 DIAGNOSIS — J019 Acute sinusitis, unspecified: Secondary | ICD-10-CM | POA: Diagnosis not present

## 2022-02-28 MED ORDER — AMOXICILLIN-POT CLAVULANATE 875-125 MG PO TABS: 1.0000 | ORAL_TABLET | Freq: Two times a day (BID) | ORAL | 0 refills | Status: DC

## 2022-02-28 NOTE — Progress Notes (Signed)
I have spent 5 minutes in review of e-visit questionnaire, review and updating patient chart, medical decision making and response to patient.   Keelyn Fjelstad Cody Britta Louth, PA-C    

## 2022-02-28 NOTE — Progress Notes (Signed)

## 2022-03-21 LAB — T4, FREE: Free T4: 1.08 ng/dL (ref 0.82–1.77)

## 2022-03-21 LAB — TSH: TSH: 2.2 u[IU]/mL (ref 0.450–4.500)

## 2022-03-21 LAB — T3, FREE: T3, Free: 2.9 pg/mL (ref 2.0–4.4)

## 2022-03-22 ENCOUNTER — Telehealth: Payer: Self-pay | Admitting: Nurse Practitioner

## 2022-03-22 NOTE — Telephone Encounter (Signed)
It can be virtual

## 2022-03-22 NOTE — Telephone Encounter (Signed)
Patient is asking if you wanted her to be in office or virtual for appt next week

## 2022-03-30 ENCOUNTER — Encounter: Payer: Self-pay | Admitting: Nurse Practitioner

## 2022-03-30 ENCOUNTER — Ambulatory Visit: Payer: BC Managed Care – PPO | Admitting: Nurse Practitioner

## 2022-03-30 VITALS — BP 97/64 | HR 67 | Ht 65.0 in | Wt 172.4 lb

## 2022-03-30 DIAGNOSIS — R768 Other specified abnormal immunological findings in serum: Secondary | ICD-10-CM | POA: Diagnosis not present

## 2022-03-30 DIAGNOSIS — Z8639 Personal history of other endocrine, nutritional and metabolic disease: Secondary | ICD-10-CM

## 2022-03-30 DIAGNOSIS — R7989 Other specified abnormal findings of blood chemistry: Secondary | ICD-10-CM

## 2022-03-30 NOTE — Progress Notes (Addendum)
03/30/2022     Endocrinology Follow Up Note     Subjective:    Patient ID: Ruth Tran, female    DOB: 12-02-1990, PCP McLean-Scocuzza, [name], MD.   Past Medical History:  Diagnosis Date   Allergy    Chicken pox    Complication of anesthesia    WITH HER WISDOM TEETH SURGERY SHE HAD TO GO TO THE OR TO HAVE THIS DONE DUE TO TACHYCARDIA DUE TO GRAVES DISEASE   Complication of anesthesia    Dysmenorrhea    Family history of endometriosis    mother,sister   Graves disease    graves, hyperthyroidism    Headache    MIGRAINES   Migraine    Ovarian cyst    Panic attack    UTI (urinary tract infection)     Past Surgical History:  Procedure Laterality Date   APPENDECTOMY  2005   WITH DRAIN PLACED DUE TO ABSCESS 2005 [facility]   BREAST BIOPSY Right 2017   had a reaction to xylo or xylo with epi where her throat closed up   CHROMOPERTUBATION Right 08/12/2018   Procedure: CHROMOPERTUBATION;  Surgeon: Benjaman Kindler, MD;  Location: [facility];  Service: Gynecology;  Laterality: Right;   FOOT SURGERY Bilateral 2008   toes going outward surgery to turn toes inward 2010    LAPAROSCOPIC OVARIAN CYSTECTOMY Right 08/12/2018   Procedure: LAPAROSCOPIC OVARIAN CYSTECTOMY;  Surgeon: Benjaman Kindler, MD;  Location: [facility];  Service: Gynecology;  Laterality: Right;   LYSIS OF ADHESION Right 08/12/2018   Procedure: LYSIS OF ADHESIONS Lysis of Omental Adhesions, right cecum adhesions, right fallopian tube and right adnexal adhesions;  Surgeon: Benjaman Kindler, MD;  Location: [facility];  Service: Gynecology;  Laterality: Right;   TONSILLECTOMY AND ADENOIDECTOMY N/A 04/17/2017   Procedure: TONSILLECTOMY AND POSSIBLE  ADENOIDECTOMY;  Surgeon: Beverly Gust, MD;  Location: [facility];  Service: ENT;  Laterality: N/A;   WISDOM TOOTH EXTRACTION     2016    Social History   Socioeconomic History   Marital status: Married    Spouse name: Not on file   Number of children: 0   Years of  education: Not on file   Highest education level: Bachelor's degree (e.g., BA, AB, BS)  Occupational History   Not on file  Tobacco Use   Smoking status: Never   Smokeless tobacco: Never  Vaping Use   Vaping Use: Never used  Substance and Sexual Activity   Alcohol use: Yes    Comment: Occasional 1-2 drinks every couple months   Drug use: No   Sexual activity: Not on file  Other Topics Concern   Not on file  Social History Narrative   BA Degree [employer]   Wears seat belt,    Guns at home    Safe in relationship    Does not exercise    Caffeine: cup of tea daily   Getting married 04/2020 new last name Joswick   67 month old son Willene Hatchet as of 09/2021    Social Determinants of Health   Financial Resource Strain: Not on file  Food Insecurity: Not on file  Transportation Needs: Not on file  Physical Activity: Not on file  Stress: Not on file  Social Connections: Not on file    Family History  Problem Relation Age of Onset   Hypertension Mother    Migraines Mother    Diabetes Father    Hyperlipidemia Father    Learning disabilities Sister  Cancer Maternal Grandmother        lung (small cell) >liver>pancreas smoker    Migraines Maternal Grandmother    Graves' disease Maternal Grandfather    Miscarriages / Stillbirths Maternal Grandfather    Cancer Maternal Grandfather        prostate/bladder    Ovarian cancer Paternal Grandmother    Cancer Paternal Grandmother        brain   Cancer Paternal Grandfather        skin cancer    Graves' disease Other    Breast cancer Neg Hx    Colon cancer Neg Hx    Heart disease Neg Hx     Outpatient Encounter Medications as of 03/30/2022  Medication Sig   Ascorbic Acid (VITAMIN C GUMMIE PO) Take by mouth daily.   Cholecalciferol 1.25 MG (50000 UT) capsule Take 1 capsule (50,000 Units total) by mouth once a week. D3 1x per week x 6 months then take over the counter vitamin D3 2000 to 4000 IU daily starting month 7   levocetirizine (XYZAL) 5  MG tablet Take 5 mg by mouth daily.   SPRINTEC 28 0.25-35 MG-MCG tablet SMARTSIG:1 Tablet(s) By Mouth Daily   amoxicillin-clavulanate (AUGMENTIN) 875-125 MG tablet Take 1 tablet by mouth 2 (two) times daily. (Patient not taking: Reported on 03/30/2022)   No facility-administered encounter medications on file as of 03/30/2022.    ALLERGIES: Allergies  Allergen Reactions   Lidocaine Anaphylaxis   Lidocaine-Epinephrine Anaphylaxis   Toradol [Ketorolac Tromethamine] Anaphylaxis and Rash    Throat swelling and feeling of heat over whole body.   Tramadol Anaphylaxis   Latex Rash   Linzess [Linaclotide]     Ab cramps    Tape Rash    VACCINATION STATUS: Immunization History  Administered Date(s) Administered   DTaP 06/01/1993, 07/28/1993, 09/29/1993, 07/03/1994, 04/06/2008   HIB (PRP-OMP) 06/01/1993, 07/28/1993, 09/29/1993, 07/03/1994   Hepatitis A 10/07/2007, 05/26/2008   Hepatitis A, Adult 10/07/2007, 05/26/2008   Hepatitis B 07/13/1992, 06/01/1993, 09/29/1993   Hepatitis B, adult 11/27/1992, 06/01/1993, 09/29/1993, 11/27/2017, 01/01/2018, 06/07/2018   IPV 06/01/1993, 07/28/1993, 09/29/1993, 04/06/1994   MMR 07/03/1994, 04/06/1998   PPD Test 04/21/2015   Td 10/07/2007   Tdap 10/07/2007, 01/21/2018, 02/18/2021   Varicella 10/27/1996, 10/07/2007     HPI  Ruth Tran is 30 y.o. female who presents today with a medical history as above. she is being seen in follow up after being seen in consultation for abnormal thyroid labs requested by McLean-Scocuzza, Tracy N, MD.    She was previously seen by pediatric endocrinology and then [facility] for diagnosis of Graves disease starting at age 10.  She was subsequently given Methimazole and was stable on that medication until she was weaned off in approximately 2015.  Since then she has been in euthyroid state.  She had a baby about 7 months ago and then started noticing different symptoms including weight gain, fatigue, easy  bruising, chronic constipation, hot/cold fluctuations, and brittle nails.    her most recent thyroid labs revealed evidence of hypothyroidism with TSH of 11.99 and FT4 of 0.6 on 10/28/21.  she denies dysphagia, choking, shortness of breath, no recent voice change.    she does have family history of thyroid dysfunction in her grandparents (grandpa had to have RAI for hyperthyroidism and grandma had hyperthyroidism in late adulthood) but denies family hx of thyroid cancer. she denies personal history of goiter. she is not on any anti-thyroid medications nor on any thyroid hormone supplements. Denies   use of Biotin containing supplements.     Review of systems  Constitutional: + minimally fluctuating body weight, current Body mass index is 28.69 kg/m., + fatigue- stable, hot/cold fluctuations Eyes: no blurry vision, no xerophthalmia ENT: no sore throat, no nodules palpated in throat, no dysphagia/odynophagia, no hoarseness Cardiovascular: no chest pain, no shortness of breath, no palpitations, no leg swelling Respiratory: no cough, no shortness of breath Gastrointestinal: no nausea/vomiting/diarrhea, chronic constipation Musculoskeletal: no muscle/joint aches Skin: no rashes, no hyperemia, brittle nails, + intermittent hair loss Neurological: no tremors, no numbness, no tingling, no dizziness Psychiatric: no depression, no anxiety   Objective:    BP 97/64 (BP Location: Left Arm, Patient Position: Sitting, Cuff Size: Normal)   Pulse 67   Ht 5' 5" (1.651 m)   Wt 172 lb 6.4 oz (78.2 kg)   BMI 28.69 kg/m   Wt Readings from Last 3 Encounters:  03/30/22 172 lb 6.4 oz (78.2 kg)  12/18/21 173 lb 1 oz (78.5 kg)  12/07/21 173 lb (78.5 kg)     BP Readings from Last 3 Encounters:  03/30/22 97/64  01/06/22 105/60  12/18/21 102/67     Physical Exam- Limited  Constitutional:  Body mass index is 28.69 kg/m. , not in acute distress, normal state of mind Eyes:  EOMI, no exophthalmos Neck:  Supple Musculoskeletal: no gross deformities, strength intact in all four extremities, no gross restriction of joint movements Skin:  no rashes, no hyperemia Neurological: no tremor with outstretched hands   CMP     Component Value Date/Time   NA 141 10/28/2021 1014   NA 142 12/23/2018 1105   K 4.2 10/28/2021 1014   CL 108 10/28/2021 1014   CO2 28 10/28/2021 1014   GLUCOSE 87 10/28/2021 1014   BUN 11 10/28/2021 1014   BUN 9 12/23/2018 1105   CREATININE 0.71 10/28/2021 1014   CALCIUM 9.0 10/28/2021 1014   PROT 6.5 10/28/2021 1014   PROT 6.2 12/23/2018 1105   ALBUMIN 3.9 10/28/2021 1014   ALBUMIN 4.2 12/23/2018 1105   AST 26 10/28/2021 1014   ALT 15 10/28/2021 1014   ALKPHOS 67 10/28/2021 1014   BILITOT 0.6 10/28/2021 1014   BILITOT 0.6 12/23/2018 1105   GFRNONAA >60 04/25/2021 1335   GFRAA >60 02/16/2019 2151     CBC    Component Value Date/Time   WBC 5.4 10/28/2021 1014   RBC 4.49 10/28/2021 1014   HGB 12.6 10/28/2021 1014   HGB 13.3 12/23/2018 1105   HCT 38.2 10/28/2021 1014   HCT 39.9 12/23/2018 1105   PLT 121.0 (L) 10/28/2021 1014   PLT 178 12/23/2018 1105   MCV 85.0 10/28/2021 1014   MCV 86 12/23/2018 1105   MCH 26.4 05/14/2021 0635   MCHC 32.9 10/28/2021 1014   RDW 16.3 (H) 10/28/2021 1014   RDW 11.7 12/23/2018 1105   LYMPHSABS 2.2 10/28/2021 1014   LYMPHSABS 1.7 12/23/2018 1105   MONOABS 0.5 10/28/2021 1014   EOSABS 0.0 10/28/2021 1014   EOSABS 0.0 12/23/2018 1105   BASOSABS 0.0 10/28/2021 1014   BASOSABS 0.0 12/23/2018 1105     Diabetic Labs (most recent): Lab Results  Component Value Date   HGBA1C 5.4 10/28/2021   HGBA1C 5.1 10/16/2017   HGBA1C 5.4 01/08/2014    Lipid Panel     Component Value Date/Time   CHOL 213 (H) 10/28/2021 1014   CHOL 190 12/23/2018 1105   TRIG 110.0 10/28/2021 1014   HDL 64.50 10/28/2021 1014     HDL 66 12/23/2018 1105   CHOLHDL 3 10/28/2021 1014   VLDL 22.0 10/28/2021 1014   LDLCALC 126 (H) 10/28/2021 1014    LDLCALC 100 (H) 12/23/2018 1105   LABVLDL 24 12/23/2018 1105     Lab Results  Component Value Date   TSH 2.200 03/20/2022   TSH 4.110 12/07/2021   TSH 11.99 (H) 10/28/2021   TSH 2.99 02/19/2020   TSH 0.192 (L) 12/23/2018   TSH 3.190 11/05/2017   TSH 1.950 10/16/2017   TSH 2.520 04/19/2016   TSH 1.870 04/30/2015   TSH 3.39 11/12/2014   FREET4 1.08 03/20/2022   FREET4 1.00 12/07/2021   FREET4 0.60 10/28/2021   FREET4 0.74 02/19/2020   FREET4 1.44 12/23/2018   FREET4 1.05 11/05/2017   FREET4 1.09 10/16/2017    Thyroid US from 12/15/21 CLINICAL DATA:  Abnormal thyroid lab values   EXAM: THYROID ULTRASOUND   TECHNIQUE: Ultrasound examination of the thyroid gland and adjacent soft tissues was performed.   COMPARISON:  None Available.   FINDINGS: Parenchymal Echotexture: Moderately heterogeneous   Isthmus: 0.7 cm   Right lobe: 6.0 x 1.4 x 1.9 cm   Left lobe: 4.9 x 1.3 x 1.6 cm   _________________________________________________________   Estimated total number of nodules >/= 1 cm: 1   Number of spongiform nodules >/=  2 cm not described below (TR1): 0   Number of mixed cystic and solid nodules >/= 1.5 cm not described below (TR2): 0   _________________________________________________________   Nodule # 1:   Location: Right; inferior   Maximum size: 1.8 cm; Other 2 dimensions: 1.0 x 0.5 cm   Composition: solid/almost completely solid (2)   Echogenicity: hypoechoic (2)   Shape: not taller-than-wide (0)   Margins: ill-defined (0)   Echogenic foci: none (0)   ACR TI-RADS total points: 4.   ACR TI-RADS risk category: TR4 (4-6 points).   ACR TI-RADS recommendations:   **Given size (>/= 1.5 cm) and appearance, fine needle aspiration of this moderately suspicious nodule should be considered based on TI-RADS criteria.   IMPRESSION: Nodule 1 (TI-RADS 4), measuring 1.8 cm, located in the inferior right thyroid lobe, meets criteria for FNA.   The  above is in keeping with the ACR TI-RADS recommendations - J Am Coll Radiol 2017;14:587-595.     Electronically Signed   By: Miachel Roux M.D.   On: 12/15/2021 11:55  Latest Reference Range & Units 11/05/17 09:43 12/23/18 11:05 02/19/20 09:01 10/28/21 10:14 12/07/21 14:48 03/20/22 11:10  TSH 0.450 - 4.500 uIU/mL 3.190 0.192 (L) 2.99 11.99 (H) 4.110 2.200  Triiodothyronine,Free,Serum 2.0 - 4.4 pg/mL     2.7 2.9  T4,Free(Direct) 0.82 - 1.77 ng/dL 1.05 1.44 0.74 0.60 1.00 1.08  Thyroperoxidase Ab SerPl-aCnc 0 - 34 IU/mL     585 (H)   Thyroglobulin Antibody 0.0 - 0.9 IU/mL     <1.0   T3 Uptake Ratio 24 - 39 %  21 (L)      (L): Data is abnormally low (H): Data is abnormally high   Assessment & Plan:   1. Abnormal TSH 2. Hx of Graves' disease 3. Thyroid nodule  she is being seen at a kind request of McLean-Scocuzza, [name], MD.  She has history of Graves disease in remission, now showing as underactive thyroid.  There are a few potential causes for this drop in thyroid function and additional testing is needed to help determining etiology which will guide treatment options.  Her repeat thyroid function tests show  euthyroid presentation.  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  She is at a genetic predisposition of developing ongoing problems with her thyroid.  She is not at a point where anti-thyroid treatment or thyroid hormone replacement is needed.  Will monitor thyroid function tests again in another 3 months for surveillance.  Her US did show moderately suspicious thyroid nodule in right inferior gland which recommends FNA to rule out malignancy.  This was done and found to be benign.       -Patient is advised to maintain close follow up with McLean-Scocuzza, Tracy N, MD for primary care needs.     I spent 30 minutes in the care of the patient today including review of labs from Thyroid Function, CMP, and other relevant labs ;  imaging/biopsy records (current and previous including abstractions from other facilities); face-to-face time discussing  her lab results and symptoms, medications doses, her options of short and long term treatment based on the latest standards of care / guidelines;   and documenting the encounter.  Janece L Attridge  participated in the discussions, expressed understanding, and voiced agreement with the above plans.  All questions were answered to her satisfaction. she is encouraged to contact clinic should she have any questions or concerns prior to her return visit.   Follow up plan: Return in about 3 months (around 06/30/2022) for Thyroid follow up, Previsit labs, Virtual visit ok.   Thank you for involving me in the care of this pleasant patient, and I will continue to update you with her progress.   Whitney Reardon, FNP-BC [facility] [address] [place], [place] [address] Phone: [phone] Fax: [phone]  03/30/2022, 3:11 PM 

## 2022-05-01 ENCOUNTER — Encounter: Payer: Self-pay | Admitting: Nurse Practitioner

## 2022-05-01 DIAGNOSIS — R768 Other specified abnormal immunological findings in serum: Secondary | ICD-10-CM

## 2022-05-04 ENCOUNTER — Telehealth: Payer: Self-pay | Admitting: *Deleted

## 2022-05-04 LAB — T4, FREE: Free T4: 1.31 ng/dL (ref 0.82–1.77)

## 2022-05-04 LAB — TSH: TSH: 2.02 u[IU]/mL (ref 0.450–4.500)

## 2022-05-04 LAB — T3, FREE: T3, Free: 3 pg/mL (ref 2.0–4.4)

## 2022-05-04 NOTE — Telephone Encounter (Signed)
-----   Message from Brita Romp, NP sent at 05/04/2022  6:59 AM EST ----- I had her repeat her labs prior to her next visit to assess thyroid function given her symptoms.    Her thyroid levels look great!  Meaning, it is not likely that the thyroid is the main cause of her symptoms.

## 2022-05-04 NOTE — Progress Notes (Signed)
I had her repeat her labs prior to her next visit to assess thyroid function given her symptoms.    Her thyroid levels look great!  Meaning, it is not likely that the thyroid is the main cause of her symptoms.

## 2022-05-04 NOTE — Telephone Encounter (Signed)
Patient was called and given her results per Whitney Reardon, NP 

## 2022-05-04 NOTE — Progress Notes (Signed)
Patient was called and made aware of her results.

## 2022-05-08 DIAGNOSIS — N920 Excessive and frequent menstruation with regular cycle: Secondary | ICD-10-CM | POA: Insufficient documentation

## 2022-06-21 ENCOUNTER — Encounter: Payer: Self-pay | Admitting: Nurse Practitioner

## 2022-06-21 NOTE — Telephone Encounter (Signed)
Can you reprint lab orders for the patient and mail them to her?

## 2022-06-28 ENCOUNTER — Telehealth: Payer: BC Managed Care – PPO | Admitting: Physician Assistant

## 2022-06-28 DIAGNOSIS — B9689 Other specified bacterial agents as the cause of diseases classified elsewhere: Secondary | ICD-10-CM | POA: Diagnosis not present

## 2022-06-28 DIAGNOSIS — J019 Acute sinusitis, unspecified: Secondary | ICD-10-CM | POA: Diagnosis not present

## 2022-06-28 LAB — TSH: TSH: 2.11 u[IU]/mL (ref 0.450–4.500)

## 2022-06-28 LAB — T4, FREE: Free T4: 1.23 ng/dL (ref 0.82–1.77)

## 2022-06-28 LAB — T3, FREE: T3, Free: 2.6 pg/mL (ref 2.0–4.4)

## 2022-06-28 MED ORDER — AMOXICILLIN-POT CLAVULANATE 875-125 MG PO TABS: 1.0000 | ORAL_TABLET | Freq: Two times a day (BID) | ORAL | 0 refills | Status: DC

## 2022-06-28 NOTE — Progress Notes (Signed)

## 2022-07-03 ENCOUNTER — Encounter: Payer: Self-pay | Admitting: Nurse Practitioner

## 2022-07-03 ENCOUNTER — Ambulatory Visit: Payer: BC Managed Care – PPO | Admitting: Nurse Practitioner

## 2022-07-03 VITALS — BP 110/70 | HR 62 | Ht 65.0 in | Wt 173.0 lb

## 2022-07-03 DIAGNOSIS — R768 Other specified abnormal immunological findings in serum: Secondary | ICD-10-CM

## 2022-07-03 NOTE — Progress Notes (Signed)
07/03/2022     Endocrinology Follow Up Note     Subjective:    Patient ID: Ruth Tran, female    DOB: 10-13-1990, PCP Carollee Leitz, MD.   Past Medical History:  Diagnosis Date   Allergy    Chicken pox    Complication of anesthesia    WITH HER WISDOM TEETH SURGERY SHE HAD TO GO TO THE OR TO HAVE THIS DONE DUE TO TACHYCARDIA DUE TO GRAVES DISEASE   Complication of anesthesia    Dysmenorrhea    Family history of endometriosis    mother,sister   Graves disease    graves, hyperthyroidism    Headache    MIGRAINES   Migraine    Ovarian cyst    Panic attack    UTI (urinary tract infection)     Past Surgical History:  Procedure Laterality Date   APPENDECTOMY  2005   WITH DRAIN PLACED DUE TO ABSCESS 2005 [facility]   BREAST BIOPSY Right 2017   had a reaction to xylo or xylo with epi where her throat closed up   CHROMOPERTUBATION Right 08/12/2018   Procedure: CHROMOPERTUBATION;  Surgeon: Benjaman Kindler, MD;  Location: [facility];  Service: Gynecology;  Laterality: Right;   FOOT SURGERY Bilateral 2008   toes going outward surgery to turn toes inward 2010    LAPAROSCOPIC OVARIAN CYSTECTOMY Right 08/12/2018   Procedure: LAPAROSCOPIC OVARIAN CYSTECTOMY;  Surgeon: Benjaman Kindler, MD;  Location: [facility];  Service: Gynecology;  Laterality: Right;   LYSIS OF ADHESION Right 08/12/2018   Procedure: LYSIS OF ADHESIONS Lysis of Omental Adhesions, right cecum adhesions, right fallopian tube and right adnexal adhesions;  Surgeon: Benjaman Kindler, MD;  Location: [facility];  Service: Gynecology;  Laterality: Right;   TONSILLECTOMY AND ADENOIDECTOMY N/A 04/17/2017   Procedure: TONSILLECTOMY AND POSSIBLE  ADENOIDECTOMY;  Surgeon: Beverly Gust, MD;  Location: [facility];  Service: ENT;  Laterality: N/A;   WISDOM TOOTH EXTRACTION     2016    Social History   Socioeconomic History   Marital status: Married    Spouse name: Not on file   Number of children: 0   Years of education:  Not on file   Highest education level: Bachelor's degree (e.g., BA, AB, BS)  Occupational History   Not on file  Tobacco Use   Smoking status: Never   Smokeless tobacco: Never  Vaping Use   Vaping Use: Never used  Substance and Sexual Activity   Alcohol use: Yes    Comment: Occasional 1-2 drinks every couple months   Drug use: No   Sexual activity: Not on file  Other Topics Concern   Not on file  Social History Narrative   BA Degree [employer]   Wears seat belt,    Guns at home    Safe in relationship    Does not exercise    Caffeine: cup of tea daily   Getting married 04/2020 new last name Brinker   41 month old son Ruth Tran as of 09/2021    Social Determinants of Health   Financial Resource Strain: Not on file  Food Insecurity: Not on file  Transportation Needs: Not on file  Physical Activity: Not on file  Stress: Not on file  Social Connections: Not on file    Family History  Problem Relation Age of Onset   Hypertension Mother    Migraines Mother    Diabetes Father    Hyperlipidemia Father    Learning disabilities Sister  Cancer Maternal Grandmother        lung (small cell) >liver>pancreas smoker    Migraines Maternal Grandmother    Graves' disease Maternal Grandfather    Miscarriages / Stillbirths Maternal Grandfather    Cancer Maternal Grandfather        prostate/bladder    Ovarian cancer Paternal Grandmother    Cancer Paternal Grandmother        brain   Cancer Paternal Grandfather        skin cancer    Graves' disease Other    Breast cancer Neg Hx    Colon cancer Neg Hx    Heart disease Neg Hx     Outpatient Encounter Medications as of 07/03/2022  Medication Sig   amoxicillin-clavulanate (AUGMENTIN) 875-125 MG tablet Take 1 tablet by mouth 2 (two) times daily.   Ascorbic Acid (VITAMIN C GUMMIE PO) Take by mouth daily.   Cholecalciferol 1.25 MG (50000 UT) capsule Take 1 capsule (50,000 Units total) by mouth once a week. D3 1x per week x 6 months then take over  the counter vitamin D3 2000 to 4000 IU daily starting month 7   levocetirizine (XYZAL) 5 MG tablet Take 5 mg by mouth daily.   SPRINTEC 28 0.25-35 MG-MCG tablet SMARTSIG:1 Tablet(s) By Mouth Daily   No facility-administered encounter medications on file as of 07/03/2022.    ALLERGIES: Allergies  Allergen Reactions   Lidocaine Anaphylaxis   Lidocaine-Epinephrine Anaphylaxis   Toradol [Ketorolac Tromethamine] Anaphylaxis and Rash    Throat swelling and feeling of heat over whole body.   Tramadol Anaphylaxis   Latex Rash   Linzess [Linaclotide]     Ab cramps    Tape Rash    VACCINATION STATUS: Immunization History  Administered Date(s) Administered   DTaP 06/01/1993, 07/28/1993, 09/29/1993, 07/03/1994, 04/06/2008   HIB (PRP-OMP) 06/01/1993, 07/28/1993, 09/29/1993, 07/03/1994   Hepatitis A 10/07/2007, 05/26/2008   Hepatitis A, Adult 10/07/2007, 05/26/2008   Hepatitis B 06/26/92, 06/01/1993, 09/29/1993   Hepatitis B, adult Oct 08, 1992, 06/01/1993, 09/29/1993, 11/27/2017, 01/01/2018, 06/07/2018   IPV 06/01/1993, 07/28/1993, 09/29/1993, 04/06/1994   MMR 07/03/1994, 04/06/1998   PPD Test 04/21/2015   Td 10/07/2007   Tdap 10/07/2007, 01/21/2018, 02/18/2021   Varicella 10/27/1996, 10/07/2007     HPI  Ruth Tran is 32 y.o. female who presents today with a medical history as above. she is being seen in follow up after being seen in consultation for abnormal thyroid labs requested by Carollee Leitz, MD.    She was previously seen by pediatric endocrinology and then [facility] for diagnosis of Graves disease starting at age 81.  She was subsequently given Methimazole and was stable on that medication until she was weaned off in approximately 2015.  Since then she has been in euthyroid state.  She had a baby about 7 months ago and then started noticing different symptoms including weight gain, fatigue, easy bruising, chronic constipation, hot/cold fluctuations, and brittle  nails.     she denies dysphagia, choking, shortness of breath, no recent voice change.    she does have family history of thyroid dysfunction in her grandparents (grandpa had to have RAI for hyperthyroidism and grandma had hyperthyroidism in late adulthood) but denies family hx of thyroid cancer. she denies personal history of goiter. she is not on any anti-thyroid medications nor on any thyroid hormone supplements. Denies use of Biotin containing supplements.     Review of systems  Constitutional: + minimally fluctuating body weight, current Body mass index is 28.79 kg/m., +  fatigue- stable, hot/cold fluctuations Eyes: no blurry vision, no xerophthalmia ENT: no sore throat, no nodules palpated in throat, no dysphagia/odynophagia, no hoarseness Cardiovascular: no chest pain, no shortness of breath, no palpitations, no leg swelling Respiratory: no cough, no shortness of breath Gastrointestinal: no nausea/vomiting/diarrhea, chronic constipation Musculoskeletal: no muscle/joint aches Skin: no rashes, no hyperemia, brittle nails, + intermittent hair loss Neurological: no tremors, no numbness, no tingling, no dizziness Psychiatric: no depression, no anxiety   Objective:    BP 110/70 Comment: Retake- Manuel Cuff  Pulse 62   Ht '5\' 5"'$  (1.651 m)   Wt 173 lb (78.5 kg)   BMI 28.79 kg/m   Wt Readings from Last 3 Encounters:  07/03/22 173 lb (78.5 kg)  03/30/22 172 lb 6.4 oz (78.2 kg)  12/18/21 173 lb 1 oz (78.5 kg)     BP Readings from Last 3 Encounters:  07/03/22 110/70  03/30/22 97/64  01/06/22 105/60     Physical Exam- Limited  Constitutional:  Body mass index is 28.79 kg/m. , not in acute distress, normal state of mind Eyes:  EOMI, no exophthalmos Musculoskeletal: no gross deformities, strength intact in all four extremities, no gross restriction of joint movements Skin:  no rashes, no hyperemia Neurological: no tremor with outstretched hands   CMP     Component Value  Date/Time   NA 141 10/28/2021 1014   NA 142 12/23/2018 1105   K 4.2 10/28/2021 1014   CL 108 10/28/2021 1014   CO2 28 10/28/2021 1014   GLUCOSE 87 10/28/2021 1014   BUN 11 10/28/2021 1014   BUN 9 12/23/2018 1105   CREATININE 0.71 10/28/2021 1014   CALCIUM 9.0 10/28/2021 1014   PROT 6.5 10/28/2021 1014   PROT 6.2 12/23/2018 1105   ALBUMIN 3.9 10/28/2021 1014   ALBUMIN 4.2 12/23/2018 1105   AST 26 10/28/2021 1014   ALT 15 10/28/2021 1014   ALKPHOS 67 10/28/2021 1014   BILITOT 0.6 10/28/2021 1014   BILITOT 0.6 12/23/2018 1105   GFRNONAA >60 04/25/2021 1335   GFRAA >60 02/16/2019 2151     CBC    Component Value Date/Time   WBC 5.4 10/28/2021 1014   RBC 4.49 10/28/2021 1014   HGB 12.6 10/28/2021 1014   HGB 13.3 12/23/2018 1105   HCT 38.2 10/28/2021 1014   HCT 39.9 12/23/2018 1105   PLT 121.0 (L) 10/28/2021 1014   PLT 178 12/23/2018 1105   MCV 85.0 10/28/2021 1014   MCV 86 12/23/2018 1105   MCH 26.4 05/14/2021 0635   MCHC 32.9 10/28/2021 1014   RDW 16.3 (H) 10/28/2021 1014   RDW 11.7 12/23/2018 1105   LYMPHSABS 2.2 10/28/2021 1014   LYMPHSABS 1.7 12/23/2018 1105   MONOABS 0.5 10/28/2021 1014   EOSABS 0.0 10/28/2021 1014   EOSABS 0.0 12/23/2018 1105   BASOSABS 0.0 10/28/2021 1014   BASOSABS 0.0 12/23/2018 1105     Diabetic Labs (most recent): Lab Results  Component Value Date   HGBA1C 5.4 10/28/2021   HGBA1C 5.1 10/16/2017   HGBA1C 5.4 01/08/2014    Lipid Panel     Component Value Date/Time   CHOL 213 (H) 10/28/2021 1014   CHOL 190 12/23/2018 1105   TRIG 110.0 10/28/2021 1014   HDL 64.50 10/28/2021 1014   HDL 66 12/23/2018 1105   CHOLHDL 3 10/28/2021 1014   VLDL 22.0 10/28/2021 1014   LDLCALC 126 (H) 10/28/2021 1014   LDLCALC 100 (H) 12/23/2018 1105   LABVLDL 24 12/23/2018 1105  Lab Results  Component Value Date   TSH 2.110 06/26/2022   TSH 2.020 05/03/2022   TSH 2.200 03/20/2022   TSH 4.110 12/07/2021   TSH 11.99 (H) 10/28/2021   TSH  2.99 02/19/2020   TSH 0.192 (L) 12/23/2018   TSH 3.190 11/05/2017   TSH 1.950 10/16/2017   TSH 2.520 04/19/2016   FREET4 1.23 06/26/2022   FREET4 1.31 05/03/2022   FREET4 1.08 03/20/2022   FREET4 1.00 12/07/2021   FREET4 0.60 10/28/2021   FREET4 0.74 02/19/2020   FREET4 1.44 12/23/2018   FREET4 1.05 11/05/2017   FREET4 1.09 10/16/2017    Thyroid US from 12/15/21 CLINICAL DATA:  Abnormal thyroid lab values   EXAM: THYROID ULTRASOUND   TECHNIQUE: Ultrasound examination of the thyroid gland and adjacent soft tissues was performed.   COMPARISON:  None Available.   FINDINGS: Parenchymal Echotexture: Moderately heterogeneous   Isthmus: 0.7 cm   Right lobe: 6.0 x 1.4 x 1.9 cm   Left lobe: 4.9 x 1.3 x 1.6 cm   _________________________________________________________   Estimated total number of nodules >/= 1 cm: 1   Number of spongiform nodules >/=  2 cm not described below (TR1): 0   Number of mixed cystic and solid nodules >/= 1.5 cm not described below (TR2): 0   _________________________________________________________   Nodule # 1:   Location: Right; inferior   Maximum size: 1.8 cm; Other 2 dimensions: 1.0 x 0.5 cm   Composition: solid/almost completely solid (2)   Echogenicity: hypoechoic (2)   Shape: not taller-than-wide (0)   Margins: ill-defined (0)   Echogenic foci: none (0)   ACR TI-RADS total points: 4.   ACR TI-RADS risk category: TR4 (4-6 points).   ACR TI-RADS recommendations:   **Given size (>/= 1.5 cm) and appearance, fine needle aspiration of this moderately suspicious nodule should be considered based on TI-RADS criteria.   IMPRESSION: Nodule 1 (TI-RADS 4), measuring 1.8 cm, located in the inferior right thyroid lobe, meets criteria for FNA.   The above is in keeping with the ACR TI-RADS recommendations - J Am Coll Radiol 2017;14:587-595.     Electronically Signed   By: Miachel Roux M.D.   On: 12/15/2021 11:55   Latest  Reference Range & Units 12/07/21 14:48 03/20/22 11:10 05/03/22 11:28 06/26/22 13:26  TSH 0.450 - 4.500 uIU/mL 4.110 2.200 2.020 2.110  Triiodothyronine,Free,Serum 2.0 - 4.4 pg/mL 2.7 2.9 3.0 2.6  T4,Free(Direct) 0.82 - 1.77 ng/dL 1.00 1.08 1.31 1.23  Thyroperoxidase Ab SerPl-aCnc 0 - 34 IU/mL 585 (H)     Thyroglobulin Antibody 0.0 - 0.9 IU/mL <1.0     (H): Data is abnormally high   Assessment & Plan:   1. Abnormal TSH 2. Hx of Graves' disease 3. Thyroid nodule  she is being seen at a kind request of Carollee Leitz, MD.  She has history of Graves disease in remission, now showing as underactive thyroid.  There are a few potential causes for this drop in thyroid function and additional testing is needed to help determining etiology which will guide treatment options.  Her repeat thyroid function tests show euthyroid presentation.  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  She is at a genetic predisposition of developing ongoing problems with her thyroid.  She is not at a point where anti-thyroid treatment or thyroid hormone replacement is needed.  Will monitor thyroid function tests again in another 4 months for surveillance.  Her [place] did show moderately suspicious thyroid nodule in right  inferior gland which recommends FNA to rule out malignancy.  This was done and found to be benign.       -Patient is advised to maintain close follow up with Carollee Leitz, MD for primary care needs.    I spent  15  minutes in the care of the patient today including review of labs from Thyroid Function, CMP, and other relevant labs ; imaging/biopsy records (current and previous including abstractions from other facilities); face-to-face time discussing  her lab results and symptoms, medications doses, her options of short and long term treatment based on the latest standards of care / guidelines;   and documenting the encounter.  [name]  participated in the  discussions, expressed understanding, and voiced agreement with the above plans.  All questions were answered to her satisfaction. she is encouraged to contact clinic should she have any questions or concerns prior to her return visit.   Follow up plan: Return in about 4 months (around 11/01/2022) for Thyroid follow up, Previsit labs.   Thank you for involving me in the care of this pleasant patient, and I will continue to update you with her progress.   Rayetta Pigg, [facility] [address] [place], [place] [address] Phone: [phone] Fax: [phone]  07/03/2022, 3:08 PM

## 2022-07-05 NOTE — Patient Instructions (Signed)
It was a pleasure meeting you today. Thank you for allowing me to take part in your health care.  Our goals for today as we discussed include:  Start Flonase 2 sprays daily to help with allergies Continue Xyzal 5 mg at night  Call ENT to schedule appointment.  If you need a referral please MyChart me and I will be happy to to send referral. . Annual physical around May.  Fast 10 hours prior to your visit for lab work that day.    If you have any questions or concerns, please do not hesitate to call the office at [phone].  I look forward to our next visit and until then take care and stay safe.  Regards,   Dana Allan, MD   [facility]

## 2022-07-05 NOTE — Progress Notes (Signed)
SUBJECTIVE:   Chief Complaint  Patient presents with   Establish Care    Transfer of Care   HPI Patient presents to clinic to transfer care.  Recurrent sinusitis. Patient reports for sinus infections in the past year.  Has noticed increased sinus infections recently.  Previously on montelukast but was discontinued.  Unsure why.  Currently takes Xyzal 5 mg daily for seasonal allergies.  Reports no nasal trauma.  Previously seen ENT for T&A.  Menorrhagia On continuous Sprintec.  Followed by OB/GYN.  History of Graves' disease. Recently seen endocrinology for follow-up of abnormal thyroid labs.  Was previously seen by pediatric endocrinology and treated for Graves' disease with methimazole.  Weaned off medication in 2015.  Euthyroid since.  During recent pregnancy at about 7 months started showing symptoms.   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   OBJECTIVE:  BP 122/82   Pulse 75   Temp 98 F (36.7 C)   Ht 5' 5"$  (1.651 m)   Wt 170 lb 12.8 oz (77.5 kg)   LMP 06/21/2022   SpO2 99%   BMI 28.42 kg/m    Physical Exam Vitals reviewed.  Constitutional:      General: She is not in acute distress.    Appearance: She is not ill-appearing.  HENT:     Head: Normocephalic.     Nose: Nose normal.  Eyes:     Conjunctiva/sclera: Conjunctivae normal.  Neck:     Thyroid: No thyromegaly or thyroid tenderness.  Cardiovascular:     Rate and Rhythm: Normal rate and regular rhythm.     Heart sounds: Normal heart sounds.  Pulmonary:     Effort: Pulmonary effort is normal.     Breath sounds: Normal breath sounds.  Abdominal:     General: Abdomen is flat. Bowel sounds are normal.     Palpations: Abdomen is soft.  Musculoskeletal:        General: Normal range of motion.     Cervical back: Normal range of motion.   Neurological:     Mental Status: She is alert and oriented to person, place, and time. Mental status is at baseline.  Psychiatric:        Mood and Affect: Mood normal.        Behavior: Behavior normal.        Thought Content: Thought content normal.        Judgment: Judgment normal.     ASSESSMENT/PLAN:  Chronic sinusitis, unspecified location Assessment & Plan: Recent MRI head from 03/14/2019 did not show any chronic sinusitis. Has been previously evaluated by ENT for other issues. Recommend to call to schedule appointment with ENT, if needing new referral I would be happy to send in for her. Nettie pot daily Humidified air Flonase daily  Orders: -     Fluticasone Propionate; Place 2 sprays into both nostrils daily.  Dispense: 16 g; Refill: 6  Overweight (BMI 25.0-29.9) -     Hemoglobin A1c; Future -     Comprehensive metabolic panel; Future -     CBC with Differential/Platelet; Future -     Lipid panel; Future  Abnormal thyroid function test Assessment & Plan: TPO elevated during pregnancy.  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 Followed by endocrinology    PDMP reviewed  Return in about 5 months (around 12/04/2022).  Carollee Leitz, MD

## 2022-07-06 ENCOUNTER — Encounter: Payer: Self-pay | Admitting: Family Medicine

## 2022-07-06 ENCOUNTER — Ambulatory Visit: Payer: BC Managed Care – PPO | Admitting: Family Medicine

## 2022-07-06 VITALS — BP 122/82 | HR 75 | Temp 98.0°F | Ht 65.0 in | Wt 170.8 lb

## 2022-07-06 DIAGNOSIS — R946 Abnormal results of thyroid function studies: Secondary | ICD-10-CM | POA: Diagnosis not present

## 2022-07-06 DIAGNOSIS — E663 Overweight: Secondary | ICD-10-CM

## 2022-07-06 DIAGNOSIS — J329 Chronic sinusitis, unspecified: Secondary | ICD-10-CM | POA: Diagnosis not present

## 2022-07-06 DIAGNOSIS — E0500 Thyrotoxicosis with diffuse goiter without thyrotoxic crisis or storm: Secondary | ICD-10-CM

## 2022-07-06 DIAGNOSIS — J302 Other seasonal allergic rhinitis: Secondary | ICD-10-CM

## 2022-07-06 DIAGNOSIS — E559 Vitamin D deficiency, unspecified: Secondary | ICD-10-CM

## 2022-07-06 DIAGNOSIS — E0590 Thyrotoxicosis, unspecified without thyrotoxic crisis or storm: Secondary | ICD-10-CM

## 2022-07-06 MED ORDER — FLUTICASONE PROPIONATE 50 MCG/ACT NA SUSP: 2.0000 | Freq: Every day | NASAL | 6 refills | Status: DC

## 2022-07-09 ENCOUNTER — Encounter: Payer: Self-pay | Admitting: Family Medicine

## 2022-07-09 ENCOUNTER — Telehealth: Payer: BC Managed Care – PPO | Admitting: Nurse Practitioner

## 2022-07-09 DIAGNOSIS — E663 Overweight: Secondary | ICD-10-CM | POA: Insufficient documentation

## 2022-07-09 DIAGNOSIS — J329 Chronic sinusitis, unspecified: Secondary | ICD-10-CM | POA: Diagnosis not present

## 2022-07-09 NOTE — Assessment & Plan Note (Signed)
Recent MRI head from 03/14/2019 did not show any chronic sinusitis. Has been previously evaluated by ENT for other issues. Recommend to call to schedule appointment with ENT, if needing new referral I would be happy to send in for her. Nettie pot daily Humidified air Flonase daily

## 2022-07-09 NOTE — Progress Notes (Signed)
E-Visit for Sinus Problems  We are sorry that you are not feeling well.  Here is how we plan to help!  Based on your recent exam with your PCP on 07-06-2022  we would not treat this as a sinus infection that requires an antibiotic. Based on your presentation I believe you most likely have Acute Viral Sinusitis.This is an infection most likely caused by a virus. There is not specific treatment for viral sinusitis other than to help you with the symptoms until the infection runs its course.  You may use an oral decongestant such as Mucinex D or if you have glaucoma or high blood pressure use plain Mucinex. Saline nasal spray help and can safely be used as often as needed for congestion. I recommend continuing the treatment prescribed by your PCP  Some authorities believe that zinc sprays or the use of Echinacea may shorten the course of your symptoms.  Sinus infections are not as easily transmitted as other respiratory infection, however we still recommend that you avoid close contact with loved ones, especially the very young and elderly.  Remember to wash your hands thoroughly throughout the day as this is the number one way to prevent the spread of infection!  Home Care: Only take medications as instructed by your medical team. Do not take these medications with alcohol. A steam or ultrasonic humidifier can help congestion.  You can place a towel over your head and breathe in the steam from hot water coming from a faucet. Avoid close contacts especially the very young and the elderly. Cover your mouth when you cough or sneeze. Always remember to wash your hands.  Get Help Right Away If: You develop worsening fever or sinus pain. You develop a severe head ache or visual changes. Your symptoms persist after you have completed your treatment plan.  Make sure you Understand these instructions. Will watch your condition. Will get help right away if you are not doing well or get worse.   Thank  you for choosing an e-visit.  Your e-visit answers were reviewed by a board certified advanced clinical practitioner to complete your personal care plan. Depending upon the condition, your plan could have included both over the counter or prescription medications.  Please review your pharmacy choice. Make sure the pharmacy is open so you can pick up prescription now. If there is a problem, you may contact your provider through [employer] and have the prescription routed to another pharmacy.  Your safety is important to [place]. If you have drug allergies check your prescription carefully.   For the next 24 hours you can use MyChart to ask questions about today's visit, request a non-urgent call back, or ask for a work or school excuse. You will get an email in the next two days asking about your experience. I hope that your e-visit has been valuable and will speed your recovery.

## 2022-07-09 NOTE — Assessment & Plan Note (Addendum)
TPO elevated during pregnancy.  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 Followed by endocrinology

## 2022-07-09 NOTE — Assessment & Plan Note (Deleted)
Recent TPO elevated.  TSH, T4 normal Followed by endocrinology

## 2022-07-09 NOTE — Progress Notes (Signed)
I have spent 5 minutes in review of e-visit questionnaire, review and updating patient chart, medical decision making and response to patient.  ° °Auden Tatar W Milderd Manocchio, NP ° °  °

## 2022-08-17 ENCOUNTER — Other Ambulatory Visit: Payer: Self-pay | Admitting: Certified Nurse Midwife

## 2022-08-17 DIAGNOSIS — N644 Mastodynia: Secondary | ICD-10-CM

## 2022-08-17 DIAGNOSIS — N6311 Unspecified lump in the right breast, upper outer quadrant: Secondary | ICD-10-CM

## 2022-08-21 ENCOUNTER — Ambulatory Visit
Admission: RE | Admit: 2022-08-21 | Discharge: 2022-08-21 | Disposition: A | Discharge status: Home / Self Care | Payer: BC Managed Care – PPO | Source: Ambulatory Visit | Attending: Certified Nurse Midwife | Admitting: Certified Nurse Midwife

## 2022-08-21 DIAGNOSIS — N644 Mastodynia: Secondary | ICD-10-CM | POA: Diagnosis not present

## 2022-08-21 DIAGNOSIS — N6311 Unspecified lump in the right breast, upper outer quadrant: Secondary | ICD-10-CM | POA: Insufficient documentation

## 2022-08-30 ENCOUNTER — Other Ambulatory Visit: Payer: Self-pay | Admitting: Certified Nurse Midwife

## 2022-08-30 DIAGNOSIS — R928 Other abnormal and inconclusive findings on diagnostic imaging of breast: Secondary | ICD-10-CM

## 2022-08-30 DIAGNOSIS — N63 Unspecified lump in unspecified breast: Secondary | ICD-10-CM

## 2022-09-05 ENCOUNTER — Inpatient Hospital Stay: Admission: RE | Admit: 2022-09-05 | Payer: BC Managed Care – PPO | Source: Ambulatory Visit

## 2022-09-06 ENCOUNTER — Encounter: Payer: Self-pay | Admitting: Family Medicine

## 2022-09-06 ENCOUNTER — Ambulatory Visit
Admission: RE | Admit: 2022-09-06 | Discharge: 2022-09-06 | Disposition: A | Discharge status: Home / Self Care | Payer: BC Managed Care – PPO | Source: Ambulatory Visit | Attending: Certified Nurse Midwife | Admitting: Certified Nurse Midwife

## 2022-09-06 DIAGNOSIS — R928 Other abnormal and inconclusive findings on diagnostic imaging of breast: Secondary | ICD-10-CM | POA: Diagnosis present

## 2022-09-06 DIAGNOSIS — N63 Unspecified lump in unspecified breast: Secondary | ICD-10-CM | POA: Diagnosis present

## 2022-09-06 HISTORY — PX: BREAST BIOPSY: SHX20

## 2022-09-06 MED ORDER — CHLOROPROCAINE HCL 1 % IJ SOLN
10.0000 mL | Freq: Once | INTRAMUSCULAR | Status: AC
  Administered 2022-09-06: 10 mL
  Filled 2022-09-06: qty 30

## 2022-09-07 ENCOUNTER — Other Ambulatory Visit: Payer: Self-pay | Admitting: Family Medicine

## 2022-09-07 DIAGNOSIS — J329 Chronic sinusitis, unspecified: Secondary | ICD-10-CM

## 2022-09-07 LAB — SURGICAL PATHOLOGY

## 2022-10-24 ENCOUNTER — Encounter: Payer: BC Managed Care – PPO | Admitting: Internal Medicine

## 2022-10-26 LAB — T3, FREE: T3, Free: 2.7 pg/mL (ref 2.0–4.4)

## 2022-10-26 LAB — TSH: TSH: 2.17 u[IU]/mL (ref 0.450–4.500)

## 2022-10-26 LAB — T4, FREE: Free T4: 1.16 ng/dL (ref 0.82–1.77)

## 2022-11-01 ENCOUNTER — Ambulatory Visit: Payer: BC Managed Care – PPO | Admitting: Nurse Practitioner

## 2022-11-01 ENCOUNTER — Encounter: Payer: Self-pay | Admitting: Family Medicine

## 2022-11-01 ENCOUNTER — Encounter: Payer: Self-pay | Admitting: Nurse Practitioner

## 2022-11-01 VITALS — BP 98/60 | HR 66 | Ht 65.0 in | Wt 169.8 lb

## 2022-11-01 DIAGNOSIS — R768 Other specified abnormal immunological findings in serum: Secondary | ICD-10-CM

## 2022-11-01 DIAGNOSIS — E041 Nontoxic single thyroid nodule: Secondary | ICD-10-CM | POA: Diagnosis not present

## 2022-11-01 NOTE — Progress Notes (Signed)
11/01/2022     Endocrinology Follow Up Note     Subjective:    Patient ID: Ruth Tran, female    DOB: [birth year], PCP Dana Allan, MD.   Past Medical History:  Diagnosis Date   Allergy    Chicken pox    Complication of anesthesia    WITH HER WISDOM TEETH SURGERY SHE HAD TO GO TO THE OR TO HAVE THIS DONE DUE TO TACHYCARDIA DUE TO GRAVES DISEASE   Complication of anesthesia    Dysmenorrhea    Family history of endometriosis    mother,sister   Graves disease    graves, hyperthyroidism    Headache    MIGRAINES   Migraine    Ovarian cyst    Panic attack    Third degree laceration of perineum during delivery, postpartum 05/13/2021   UTI (urinary tract infection)     Past Surgical History:  Procedure Laterality Date   APPENDECTOMY  2005   WITH DRAIN PLACED DUE TO ABSCESS 2005 [facility]   BREAST BIOPSY Right 2017   had a reaction to xylo or xylo with epi where her throat closed up   BREAST BIOPSY Right 09/06/2022   [place] RT BREAST BX W LOC DEV 1ST LESION IMG BX SPEC US GUIDE 09/06/2022 [facility]   CHROMOPERTUBATION Right 08/12/2018   Procedure: CHROMOPERTUBATION;  Surgeon: Christeen Douglas, MD;  Location: [facility];  Service: Gynecology;  Laterality: Right;   FOOT SURGERY Bilateral 2008   toes going outward surgery to turn toes inward 2010    LAPAROSCOPIC OVARIAN CYSTECTOMY Right 08/12/2018   Procedure: LAPAROSCOPIC OVARIAN CYSTECTOMY;  Surgeon: Christeen Douglas, MD;  Location: [facility];  Service: Gynecology;  Laterality: Right;   LYSIS OF ADHESION Right 08/12/2018   Procedure: LYSIS OF ADHESIONS Lysis of Omental Adhesions, right cecum adhesions, right fallopian tube and right adnexal adhesions;  Surgeon: Christeen Douglas, MD;  Location: [facility];  Service: Gynecology;  Laterality: Right;   TONSILLECTOMY AND ADENOIDECTOMY N/A 04/17/2017   Procedure: TONSILLECTOMY AND POSSIBLE  ADENOIDECTOMY;  Surgeon: Linus Salmons, MD;  Location: [facility];  Service: ENT;   Laterality: N/A;   WISDOM TOOTH EXTRACTION     2016    Social History   Socioeconomic History   Marital status: Married    Spouse name: Not on file   Number of children: 0   Years of education: Not on file   Highest education level: Bachelor's degree (e.g., BA, AB, BS)  Occupational History   Not on file  Tobacco Use   Smoking status: Never   Smokeless tobacco: Never  Vaping Use   Vaping Use: Never used  Substance and Sexual Activity   Alcohol use: Yes    Comment: Occasional 1-2 drinks every couple months   Drug use: No   Sexual activity: Not on file  Other Topics Concern   Not on file  Social History Narrative   BA Degree [employer]   Wears seat belt,    Guns at home    Safe in relationship    Does not exercise    Caffeine: cup of tea daily   Getting married 04/2020 new last name Yanagi   51 month old son Eudelia Bunch as of 09/2021    Social Determinants of Health   Financial Resource Strain: Not on file  Food Insecurity: Not on file  Transportation Needs: Not on file  Physical Activity: Not on file  Stress: Not on file  Social Connections: Not on file  Family History  Problem Relation Age of Onset   Hypertension Mother    Migraines Mother    Diabetes Father    Hyperlipidemia Father    Learning disabilities Sister    Cancer Maternal Grandmother        lung (small cell) >liver>pancreas smoker    Migraines Maternal Grandmother    Graves' disease Maternal Grandfather    Miscarriages / Stillbirths Maternal Grandfather    Cancer Maternal Grandfather        prostate/bladder    Ovarian cancer Paternal Grandmother    Cancer Paternal Grandmother        brain   Cancer Paternal Grandfather        skin cancer    Graves' disease Other    Breast cancer Neg Hx    Colon cancer Neg Hx    Heart disease Neg Hx     Outpatient Encounter Medications as of 11/01/2022  Medication Sig   fluticasone (FLONASE) 50 MCG/ACT nasal spray Place 2 sprays into both nostrils daily.    levocetirizine (XYZAL) 5 MG tablet Take 5 mg by mouth daily.   SPRINTEC 28 0.25-35 MG-MCG tablet SMARTSIG:1 Tablet(s) By Mouth Daily   Ascorbic Acid (VITAMIN C GUMMIE PO) Take by mouth daily.   Cholecalciferol 1.25 MG (50000 UT) capsule Take 1 capsule (50,000 Units total) by mouth once a week. D3 1x per week x 6 months then take over the counter vitamin D3 2000 to 4000 IU daily starting month 7 (Patient not taking: Reported on 11/01/2022)   No facility-administered encounter medications on file as of 11/01/2022.    ALLERGIES: Allergies  Allergen Reactions   Lidocaine Anaphylaxis   Lidocaine-Epinephrine Anaphylaxis   Toradol [Ketorolac Tromethamine] Anaphylaxis and Rash    Throat swelling and feeling of heat over whole body.   Tramadol Anaphylaxis   Latex Rash   Linzess [Linaclotide]     Ab cramps    Tape Rash    VACCINATION STATUS: Immunization History  Administered Date(s) Administered   DTaP 06/01/1993, 07/28/1993, 09/29/1993, 07/03/1994, 04/06/2008   HIB (PRP-OMP) 06/01/1993, 07/28/1993, 09/29/1993, 07/03/1994   Hepatitis A 10/07/2007, 05/26/2008   Hepatitis A, Adult 10/07/2007, 05/26/2008   Hepatitis B 02-16-1993, 06/01/1993, 09/29/1993   Hepatitis B, ADULT 1991-05-21, 06/01/1993, 09/29/1993, 11/27/2017, 01/01/2018, 06/07/2018   IPV 06/01/1993, 07/28/1993, 09/29/1993, 04/06/1994   MMR 07/03/1994, 04/06/1998   PPD Test 04/21/2015   Td 10/07/2007   Tdap 10/07/2007, 01/21/2018, 02/18/2021   Varicella 10/27/1996, 10/07/2007     HPI  Ruth Tran is 32 y.o. female who presents today with a medical history as above. she is being seen in follow up after being seen in consultation for abnormal thyroid labs requested by Dana Allan, MD.    She was previously seen by pediatric endocrinology and then [facility] for diagnosis of Graves disease starting at age 43.  She was subsequently given Methimazole and was stable on that medication until she was weaned off in  approximately 2015.  Since then she has been in euthyroid state.  She had a baby about 7 months ago and then started noticing different symptoms including weight gain, fatigue, easy bruising, chronic constipation, hot/cold fluctuations, and brittle nails.     she denies dysphagia, choking, shortness of breath, no recent voice change.    she does have family history of thyroid dysfunction in her grandparents (grandpa had to have RAI for hyperthyroidism and grandma had hyperthyroidism in late adulthood) but denies family hx of thyroid cancer. she denies personal history of  goiter. she is not on any anti-thyroid medications nor on any thyroid hormone supplements. Denies use of Biotin containing supplements.     Review of systems  Constitutional: + minimally fluctuating body weight, current Body mass index is 28.26 kg/m., + fatigue- stable, hot/cold fluctuations-stable Eyes: no blurry vision, no xerophthalmia ENT: no sore throat, no nodules palpated in throat, no dysphagia/odynophagia, no hoarseness Cardiovascular: no chest pain, no shortness of breath, no palpitations, no leg swelling Respiratory: no cough, no shortness of breath Gastrointestinal: no nausea/vomiting/diarrhea, chronic constipation Musculoskeletal: no muscle/joint aches Skin: no rashes, no hyperemia, brittle nails, + intermittent hair loss, noticed she is bruising easily Neurological: no tremors, no numbness, no tingling, no dizziness Psychiatric: no depression, no anxiety   Objective:    BP 98/60 (BP Location: Right Arm, Patient Position: Sitting, Cuff Size: Normal)   Pulse 66   Ht 5\' 5"  (1.651 m)   Wt 169 lb 12.8 oz (77 kg)   BMI 28.26 kg/m   Wt Readings from Last 3 Encounters:  11/01/22 169 lb 12.8 oz (77 kg)  07/06/22 170 lb 12.8 oz (77.5 kg)  07/03/22 173 lb (78.5 kg)     BP Readings from Last 3 Encounters:  11/01/22 98/60  07/06/22 122/82  07/03/22 110/70      Physical Exam- Limited  Constitutional:   Body mass index is 28.26 kg/m. , not in acute distress, normal state of mind Eyes:  EOMI, no exophthalmos Musculoskeletal: no gross deformities, strength intact in all four extremities, no gross restriction of joint movements Skin:  no rashes, no hyperemia Neurological: no tremor with outstretched hands   CMP     Component Value Date/Time   NA 141 10/28/2021 1014   NA 142 12/23/2018 1105   K 4.2 10/28/2021 1014   CL 108 10/28/2021 1014   CO2 28 10/28/2021 1014   GLUCOSE 87 10/28/2021 1014   BUN 11 10/28/2021 1014   BUN 9 12/23/2018 1105   CREATININE 0.71 10/28/2021 1014   CALCIUM 9.0 10/28/2021 1014   PROT 6.5 10/28/2021 1014   PROT 6.2 12/23/2018 1105   ALBUMIN 3.9 10/28/2021 1014   ALBUMIN 4.2 12/23/2018 1105   AST 26 10/28/2021 1014   ALT 15 10/28/2021 1014   ALKPHOS 67 10/28/2021 1014   BILITOT 0.6 10/28/2021 1014   BILITOT 0.6 12/23/2018 1105   GFRNONAA >60 04/25/2021 1335   GFRAA >60 02/16/2019 2151     CBC    Component Value Date/Time   WBC 5.4 10/28/2021 1014   RBC 4.49 10/28/2021 1014   HGB 12.6 10/28/2021 1014   HGB 13.3 12/23/2018 1105   HCT 38.2 10/28/2021 1014   HCT 39.9 12/23/2018 1105   PLT 121.0 (L) 10/28/2021 1014   PLT 178 12/23/2018 1105   MCV 85.0 10/28/2021 1014   MCV 86 12/23/2018 1105   MCH 26.4 05/14/2021 0635   MCHC 32.9 10/28/2021 1014   RDW 16.3 (H) 10/28/2021 1014   RDW 11.7 12/23/2018 1105   LYMPHSABS 2.2 10/28/2021 1014   LYMPHSABS 1.7 12/23/2018 1105   MONOABS 0.5 10/28/2021 1014   EOSABS 0.0 10/28/2021 1014   EOSABS 0.0 12/23/2018 1105   BASOSABS 0.0 10/28/2021 1014   BASOSABS 0.0 12/23/2018 1105     Diabetic Labs (most recent): Lab Results  Component Value Date   HGBA1C 5.4 10/28/2021   HGBA1C 5.1 10/16/2017   HGBA1C 5.4 01/08/2014    Lipid Panel     Component Value Date/Time   CHOL 213 (H) 10/28/2021 1014  CHOL 190 12/23/2018 1105   TRIG 110.0 10/28/2021 1014   HDL 64.50 10/28/2021 1014   HDL 66 12/23/2018  1105   CHOLHDL 3 10/28/2021 1014   VLDL 22.0 10/28/2021 1014   LDLCALC 126 (H) 10/28/2021 1014   LDLCALC 100 (H) 12/23/2018 1105   LABVLDL 24 12/23/2018 1105     Lab Results  Component Value Date   TSH 2.170 10/25/2022   TSH 2.110 06/26/2022   TSH 2.020 05/03/2022   TSH 2.200 03/20/2022   TSH 4.110 12/07/2021   TSH 11.99 (H) 10/28/2021   TSH 2.99 02/19/2020   TSH 0.192 (L) 12/23/2018   TSH 3.190 11/05/2017   TSH 1.950 10/16/2017   FREET4 1.16 10/25/2022   FREET4 1.23 06/26/2022   FREET4 1.31 05/03/2022   FREET4 1.08 03/20/2022   FREET4 1.00 12/07/2021   FREET4 0.60 10/28/2021   FREET4 0.74 02/19/2020   FREET4 1.44 12/23/2018   FREET4 1.05 11/05/2017   FREET4 1.09 10/16/2017    Thyroid US from 12/15/21 CLINICAL DATA:  Abnormal thyroid lab values   EXAM: THYROID ULTRASOUND   TECHNIQUE: Ultrasound examination of the thyroid gland and adjacent soft tissues was performed.   COMPARISON:  None Available.   FINDINGS: Parenchymal Echotexture: Moderately heterogeneous   Isthmus: 0.7 cm   Right lobe: 6.0 x 1.4 x 1.9 cm   Left lobe: 4.9 x 1.3 x 1.6 cm   _________________________________________________________   Estimated total number of nodules >/= 1 cm: 1   Number of spongiform nodules >/=  2 cm not described below (TR1): 0   Number of mixed cystic and solid nodules >/= 1.5 cm not described below (TR2): 0   _________________________________________________________   Nodule # 1:   Location: Right; inferior   Maximum size: 1.8 cm; Other 2 dimensions: 1.0 x 0.5 cm   Composition: solid/almost completely solid (2)   Echogenicity: hypoechoic (2)   Shape: not taller-than-wide (0)   Margins: ill-defined (0)   Echogenic foci: none (0)   ACR TI-RADS total points: 4.   ACR TI-RADS risk category: TR4 (4-6 points).   ACR TI-RADS recommendations:   **Given size (>/= 1.5 cm) and appearance, fine needle aspiration of this moderately suspicious nodule should  be considered based on TI-RADS criteria.   IMPRESSION: Nodule 1 (TI-RADS 4), measuring 1.8 cm, located in the inferior right thyroid lobe, meets criteria for FNA.   The above is in keeping with the ACR TI-RADS recommendations - J Am Coll Radiol 2017;14:587-595.     Electronically Signed   By: Acquanetta Belling M.D.   On: 12/15/2021 11:55   Latest Reference Range & Units 03/20/22 11:10 05/03/22 11:28 06/26/22 13:26 10/25/22 11:13  TSH 0.450 - 4.500 uIU/mL 2.200 2.020 2.110 2.170  Triiodothyronine,Free,Serum 2.0 - 4.4 pg/mL 2.9 3.0 2.6 2.7  T4,Free(Direct) 0.82 - 1.77 ng/dL 1.61 0.96 0.45 4.09     Assessment & Plan:   1. Abnormal TSH 2. Hx of Graves' disease 3. Thyroid nodule  she is being seen at a kind request of Dana Allan, MD.  She has history of Graves disease in remission, previously showing as underactive thyroid.  There are a few potential causes for this drop in thyroid function and additional testing is needed to help determining etiology which will guide treatment options.  Her repeat thyroid function tests show euthyroid presentation.  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  She is at a genetic predisposition of developing ongoing problems with her thyroid.  She is not at  a point where anti-thyroid treatment or thyroid hormone replacement is needed.  Will monitor thyroid function tests again in another 6 months for surveillance.  Her [place] did show moderately suspicious thyroid nodule in right inferior gland which recommends FNA to rule out malignancy.  This was done and found to be benign.       -Patient is advised to maintain close follow up with Dana Allan, MD for primary care needs.  Has annual physical coming up with her PCP.     I spent  12  minutes in the care of the patient today including review of labs from Thyroid Function, CMP, and other relevant labs ; imaging/biopsy records (current and previous including  abstractions from other facilities); face-to-face time discussing  her lab results and symptoms, medications doses, her options of short and long term treatment based on the latest standards of care / guidelines;   and documenting the encounter.  Ruth Tran  participated in the discussions, expressed understanding, and voiced agreement with the above plans.  All questions were answered to her satisfaction. she is encouraged to contact clinic should she have any questions or concerns prior to her return visit.   Follow up plan: Return in about 6 months (around 05/03/2023) for Thyroid follow up, Previsit labs.   Thank you for involving me in the care of this pleasant patient, and I will continue to update you with her progress.   Ronny Bacon, [facility] [address] [place], [place] [address] Phone: [phone] Fax: [phone]  11/01/2022, 11:39 AM

## 2022-11-06 ENCOUNTER — Other Ambulatory Visit (INDEPENDENT_AMBULATORY_CARE_PROVIDER_SITE_OTHER): Payer: BC Managed Care – PPO

## 2022-11-06 DIAGNOSIS — E663 Overweight: Secondary | ICD-10-CM

## 2022-11-06 LAB — LIPID PANEL
Cholesterol: 159 mg/dL (ref 0–200)
HDL: 55.7 mg/dL (ref >39.00)
LDL Cholesterol: 85 mg/dL (ref 0–99)
NonHDL: 103.14
Total CHOL/HDL Ratio: 3
Triglycerides: 93 mg/dL (ref 0.0–149.0)
VLDL: 18.6 mg/dL (ref 0.0–40.0)

## 2022-11-06 LAB — COMPREHENSIVE METABOLIC PANEL
ALT: 10 U/L (ref 0–35)
AST: 19 U/L (ref 0–37)
Albumin: 3.7 g/dL (ref 3.5–5.2)
Alkaline Phosphatase: 40 U/L (ref 39–117)
BUN: 13 mg/dL (ref 6–23)
CO2: 26 mEq/L (ref 19–32)
Calcium: 8.8 mg/dL (ref 8.4–10.5)
Chloride: 108 mEq/L (ref 96–112)
Creatinine, Ser: 0.65 mg/dL (ref 0.40–1.20)
GFR: 118.83 mL/min (ref >60.00)
Glucose, Bld: 77 mg/dL (ref 70–99)
Potassium: 4.4 mEq/L (ref 3.5–5.1)
Sodium: 141 mEq/L (ref 135–145)
Total Bilirubin: 0.7 mg/dL (ref 0.2–1.2)
Total Protein: 6.1 g/dL (ref 6.0–8.3)

## 2022-11-06 LAB — CBC WITH DIFFERENTIAL/PLATELET
Basophils Absolute: 0 10*3/uL (ref 0.0–0.1)
Basophils Relative: 0.3 % (ref 0.0–3.0)
Eosinophils Absolute: 0 10*3/uL (ref 0.0–0.7)
Eosinophils Relative: 1 % (ref 0.0–5.0)
HCT: 40 % (ref 36.0–46.0)
Hemoglobin: 13.1 g/dL (ref 12.0–15.0)
Lymphocytes Relative: 42.3 % (ref 12.0–46.0)
Lymphs Abs: 1.9 10*3/uL (ref 0.7–4.0)
MCHC: 32.6 g/dL (ref 30.0–36.0)
MCV: 89.7 fl (ref 78.0–100.0)
Monocytes Absolute: 0.5 10*3/uL (ref 0.1–1.0)
Monocytes Relative: 11.2 % (ref 3.0–12.0)
Neutro Abs: 2 10*3/uL (ref 1.4–7.7)
Neutrophils Relative %: 45.2 % (ref 43.0–77.0)
Platelets: 109 10*3/uL — ABNORMAL LOW (ref 150.0–400.0)
RBC: 4.46 Mil/uL (ref 3.87–5.11)
RDW: 13.4 % (ref 11.5–15.5)
WBC: 4.4 10*3/uL (ref 4.0–10.5)

## 2022-11-06 LAB — HEMOGLOBIN A1C: Hgb A1c MFr Bld: 5.1 % (ref 4.6–6.5)

## 2022-11-07 ENCOUNTER — Encounter: Payer: Self-pay | Admitting: Family Medicine

## 2022-11-08 ENCOUNTER — Ambulatory Visit (INDEPENDENT_AMBULATORY_CARE_PROVIDER_SITE_OTHER): Payer: BC Managed Care – PPO | Admitting: Family Medicine

## 2022-11-08 ENCOUNTER — Encounter: Payer: Self-pay | Admitting: Family Medicine

## 2022-11-08 VITALS — BP 116/72 | HR 75 | Temp 97.6°F | Ht 65.0 in | Wt 168.8 lb

## 2022-11-08 DIAGNOSIS — Z0000 Encounter for general adult medical examination without abnormal findings: Secondary | ICD-10-CM | POA: Diagnosis not present

## 2022-11-08 DIAGNOSIS — D696 Thrombocytopenia, unspecified: Secondary | ICD-10-CM

## 2022-11-08 DIAGNOSIS — R946 Abnormal results of thyroid function studies: Secondary | ICD-10-CM

## 2022-11-08 DIAGNOSIS — Z1159 Encounter for screening for other viral diseases: Secondary | ICD-10-CM

## 2022-11-08 NOTE — Patient Instructions (Addendum)
It was a pleasure meeting you today. Thank you for allowing me to take part in your health care.  Our goals for today as we discussed include:  Schedule lab appointment in 3 weeks   Follow up in 1 year for annual physical  If you have any questions or concerns, please do not hesitate to call the office at [phone].  I look forward to our next visit and until then take care and stay safe.  Regards,   Dana Allan, MD   [facility]

## 2022-11-08 NOTE — Progress Notes (Signed)
SUBJECTIVE:   Chief Complaint  Patient presents with   Annual Exam   HPI Patient presents to clinic for annual physical.  No acute concerns today  Recurrent sinusitis. Had evaluation at [facility] with ENT.  Nasal endoscopy negative for nasal polyps.  Edema noted. Maxillofacial CT showing polyp vs mucosal cyst.  Minimal rightward nasal septal deviation without spurring. Considering septoplasty but unsure.    Thrombocytopenia Recently noted on CBC.  Denies any abnormal bleeding. Is currently on menses.  Did have history of menorrhagia but since on Sprintec has resolved.  Endorses taking Naprosyn 225 monthly for migraines. Uses sparingly.  She has noticed easy bruising, fatigue which she attributed to her thyroid.    Graves' disease. Asymptomatic.  Not currently on medication.  Follows with  Endocrinology.  Currently euthyroid.  No indication for replacement therapy at this time. Positive antithyroid antibodies. TSH normal  . 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   OBJECTIVE:  BP 116/72   Pulse 75   Temp 97.6 F (36.4 C)   Ht 5\' 5"  (1.651 m)   Wt 168 lb 12.8 oz (76.6 kg)   LMP 11/08/2022 Comment: Patient's period just started  SpO2 99%   BMI 28.09 kg/m    Physical Exam Vitals reviewed.  Constitutional:      General: She is not in acute distress.    Appearance: She is not ill-appearing.  HENT:     Head: Normocephalic.     Nose: Nose normal.  Eyes:     Conjunctiva/sclera: Conjunctivae normal.  Neck:     Thyroid: No thyromegaly or thyroid tenderness.  Cardiovascular:     Rate and Rhythm: Normal rate and regular rhythm.     Heart sounds: Normal heart sounds.  Pulmonary:     Effort: Pulmonary effort is normal.     Breath sounds: Normal breath sounds.  Abdominal:     General: Abdomen is flat. Bowel sounds  are normal.     Palpations: Abdomen is soft.  Musculoskeletal:        General: Normal range of motion.     Cervical back: Normal range of motion.  Neurological:     Mental Status: She is alert and oriented to person, place, and time. Mental status is at baseline.  Psychiatric:        Mood and Affect: Mood normal.        Behavior: Behavior normal.        Thought Content: Thought content normal.        Judgment: Judgment normal.     ASSESSMENT/PLAN:  Annual physical exam Assessment & Plan: HCM PAP up to date. Normal results 01/23.  Follow up with OBGYN for cervical cancer screening.  Due 01/25.  HPV co testing at that time Tetanus up to date. Due 09/32 HIV screening completed Hepatitis C screening labs  Depression/GAD screening Recommend routine self breast exams   Thrombocytopenia (HCC) Assessment & Plan: Platelets consistently dropping in the past three years. From 179 now 109.  No recent illness.  Considered ITP, AI given history of Graves.  Currently on menses Repeat CBC 2 weeks after menses Check smear, PT/INR Follow up with results Strict return precautions provided.  Orders: -     CBC with Differential/Platelet; Future -     CP4508-PT/INR AND PTT; Future -     Pathologist smear review; Future  Need for hepatitis C screening test -  Hepatitis C antibody; Future  Abnormal thyroid function test Assessment & Plan: Chronic.  TPO elevated during pregnancy.  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 Recent TSH, T4 normal Continue to follow with endocrinology     PDMP reviewed  Return in about 3 weeks (around 11/29/2022) for PCP.  Dana Allan, MD

## 2022-11-18 ENCOUNTER — Encounter: Payer: Self-pay | Admitting: Family Medicine

## 2022-11-18 DIAGNOSIS — D696 Thrombocytopenia, unspecified: Secondary | ICD-10-CM | POA: Insufficient documentation

## 2022-11-18 DIAGNOSIS — Z1159 Encounter for screening for other viral diseases: Secondary | ICD-10-CM | POA: Insufficient documentation

## 2022-11-18 NOTE — Assessment & Plan Note (Signed)
Chronic.  TPO elevated during pregnancy.  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 Recent TSH, T4 normal Continue to follow with endocrinology

## 2022-11-18 NOTE — Assessment & Plan Note (Signed)
Platelets consistently dropping in the past three years. From 179 now 109.  No recent illness.  Considered ITP, AI given history of Graves.  Currently on menses Repeat CBC 2 weeks after menses Check smear, PT/INR Follow up with results Strict return precautions provided.

## 2022-11-18 NOTE — Assessment & Plan Note (Signed)
HCM PAP up to date. Normal results 01/23.  Follow up with OBGYN for cervical cancer screening.  Due 01/25.  HPV co testing at that time Tetanus up to date. Due 09/32 HIV screening completed Hepatitis C screening labs  Depression/GAD screening Recommend routine self breast exams

## 2022-11-27 ENCOUNTER — Other Ambulatory Visit (INDEPENDENT_AMBULATORY_CARE_PROVIDER_SITE_OTHER): Payer: BC Managed Care – PPO

## 2022-11-27 DIAGNOSIS — D696 Thrombocytopenia, unspecified: Secondary | ICD-10-CM | POA: Diagnosis not present

## 2022-11-27 DIAGNOSIS — Z1159 Encounter for screening for other viral diseases: Secondary | ICD-10-CM

## 2022-11-27 LAB — CBC WITH DIFFERENTIAL/PLATELET
Basophils Absolute: 0 10*3/uL (ref 0.0–0.1)
Basophils Relative: 0.5 % (ref 0.0–3.0)
Eosinophils Absolute: 0 10*3/uL (ref 0.0–0.7)
Eosinophils Relative: 0.7 % (ref 0.0–5.0)
HCT: 40.3 % (ref 36.0–46.0)
Hemoglobin: 13.2 g/dL (ref 12.0–15.0)
Lymphocytes Relative: 44.4 % (ref 12.0–46.0)
Lymphs Abs: 2.3 10*3/uL (ref 0.7–4.0)
MCHC: 32.6 g/dL (ref 30.0–36.0)
MCV: 88.6 fl (ref 78.0–100.0)
Monocytes Absolute: 0.4 10*3/uL (ref 0.1–1.0)
Monocytes Relative: 7.5 % (ref 3.0–12.0)
Neutro Abs: 2.4 10*3/uL (ref 1.4–7.7)
Neutrophils Relative %: 46.9 % (ref 43.0–77.0)
Platelets: 118 10*3/uL — ABNORMAL LOW (ref 150.0–400.0)
RBC: 4.55 Mil/uL (ref 3.87–5.11)
RDW: 13 % (ref 11.5–15.5)
WBC: 5.1 10*3/uL (ref 4.0–10.5)

## 2022-11-28 LAB — PATHOLOGIST SMEAR REVIEW

## 2022-11-28 LAB — CP4508-PT/INR AND PTT: INR: 0.9

## 2022-11-29 LAB — CP4508-PT/INR AND PTT
Prothrombin Time: 9.8 s (ref 9.0–11.5)
aPTT: 26 s (ref 23–32)

## 2022-11-29 LAB — HEPATITIS C ANTIBODY: Hepatitis C Ab: NONREACTIVE

## 2022-12-05 ENCOUNTER — Encounter: Payer: Self-pay | Admitting: Family Medicine

## 2022-12-09 ENCOUNTER — Encounter: Payer: Self-pay | Admitting: Family Medicine

## 2023-01-09 DIAGNOSIS — R1031 Right lower quadrant pain: Secondary | ICD-10-CM | POA: Insufficient documentation

## 2023-02-15 ENCOUNTER — Telehealth: Payer: BC Managed Care – PPO | Admitting: Physician Assistant

## 2023-02-15 DIAGNOSIS — J208 Acute bronchitis due to other specified organisms: Secondary | ICD-10-CM | POA: Diagnosis not present

## 2023-02-15 DIAGNOSIS — B9689 Other specified bacterial agents as the cause of diseases classified elsewhere: Secondary | ICD-10-CM | POA: Diagnosis not present

## 2023-02-15 MED ORDER — AZITHROMYCIN 250 MG PO TABS
ORAL_TABLET | ORAL | 0 refills | Status: AC
Start: 2023-02-15 — End: 2023-02-20

## 2023-02-15 NOTE — Progress Notes (Signed)
E-Visit for Cough  We are sorry that you are not feeling well.  Here is how we plan to help!  Based on your presentation I believe you most likely have A cough due to bacteria.  When patients have a productive cough with a change in color or increased sputum production, we are concerned about bacterial bronchitis.  If left untreated it can progress to pneumonia.  If your symptoms do not improve with your treatment plan it is important that you contact your provider.   I have prescribed Azithromyin 250 mg: two tablets now and then one tablet daily for 4 additonal days    In addition you may use A prescription cough medication called Tessalon Perles 100mg . You may take 1-2 capsules every 8 hours as needed for your cough.  From your responses in the eVisit questionnaire you describe inflammation in the upper respiratory tract which is causing a significant cough.  This is commonly called Bronchitis and has four common causes:   Allergies Viral Infections Acid Reflux Bacterial Infection Allergies, viruses and acid reflux are treated by controlling symptoms or eliminating the cause. An example might be a cough caused by taking certain blood pressure medications. You stop the cough by changing the medication. Another example might be a cough caused by acid reflux. Controlling the reflux helps control the cough.  USE OF BRONCHODILATOR ("RESCUE") INHALERS: There is a risk from using your bronchodilator too frequently.  The risk is that over-reliance on a medication which only relaxes the muscles surrounding the breathing tubes can reduce the effectiveness of medications prescribed to reduce swelling and congestion of the tubes themselves.  Although you feel brief relief from the bronchodilator inhaler, your asthma may actually be worsening with the tubes becoming more swollen and filled with mucus.  This can delay other crucial treatments, such as oral steroid medications. If you need to use a bronchodilator  inhaler daily, several times per day, you should discuss this with your provider.  There are probably better treatments that could be used to keep your asthma under control.     HOME CARE Only take medications as instructed by your medical team. Complete the entire course of an antibiotic. Drink plenty of fluids and get plenty of rest. Avoid close contacts especially the very young and the elderly Cover your mouth if you cough or cough into your sleeve. Always remember to wash your hands A steam or ultrasonic humidifier can help congestion.   GET HELP RIGHT AWAY IF: You develop worsening fever. You become short of breath You cough up blood. Your symptoms persist after you have completed your treatment plan MAKE SURE YOU  Understand these instructions. Will watch your condition. Will get help right away if you are not doing well or get worse.    Thank you for choosing an e-visit.  Your e-visit answers were reviewed by a board certified advanced clinical practitioner to complete your personal care plan. Depending upon the condition, your plan could have included both over the counter or prescription medications.  Please review your pharmacy choice. Make sure the pharmacy is open so you can pick up prescription now. If there is a problem, you may contact your provider through [employer] and have the prescription routed to another pharmacy.  Your safety is important to [place]. If you have drug allergies check your prescription carefully.   For the next 24 hours you can use MyChart to ask questions about today's visit, request a non-urgent call back, or ask  for a work or school excuse. You will get an email in the next two days asking about your experience. I hope that your e-visit has been valuable and will speed your recovery.

## 2023-02-15 NOTE — Progress Notes (Signed)
I have spent 5 minutes in review of e-visit questionnaire, review and updating patient chart, medical decision making and response to patient.   Mia Milan Cody Jacklynn Dehaas, PA-C    

## 2023-02-21 ENCOUNTER — Encounter: Payer: Self-pay | Admitting: Family Medicine

## 2023-02-22 NOTE — Addendum Note (Signed)
Addended by: Vertis Kelch on: 02/22/2023 02:54 PM   Modules accepted: Orders

## 2023-02-27 ENCOUNTER — Encounter: Payer: Self-pay | Admitting: Family Medicine

## 2023-02-28 ENCOUNTER — Encounter: Payer: Self-pay | Admitting: Family Medicine

## 2023-02-28 ENCOUNTER — Inpatient Hospital Stay: Payer: BC Managed Care – PPO | Admitting: Internal Medicine

## 2023-02-28 ENCOUNTER — Telehealth: Payer: BC Managed Care – PPO | Admitting: Family Medicine

## 2023-02-28 ENCOUNTER — Other Ambulatory Visit: Payer: BC Managed Care – PPO

## 2023-02-28 VITALS — HR 85 | Ht 65.0 in | Wt 168.0 lb

## 2023-02-28 DIAGNOSIS — H938X1 Other specified disorders of right ear: Secondary | ICD-10-CM | POA: Diagnosis not present

## 2023-02-28 DIAGNOSIS — J189 Pneumonia, unspecified organism: Secondary | ICD-10-CM | POA: Diagnosis not present

## 2023-02-28 DIAGNOSIS — R059 Cough, unspecified: Secondary | ICD-10-CM

## 2023-02-28 MED ORDER — PREDNISONE 10 MG (21) PO TBPK
ORAL_TABLET | ORAL | 0 refills | Status: DC
Start: 2023-02-28 — End: 2023-03-06

## 2023-02-28 MED ORDER — BENZONATATE 100 MG PO CAPS
200.0000 mg | ORAL_CAPSULE | Freq: Three times a day (TID) | ORAL | 0 refills | Status: DC | PRN
Start: 2023-02-28 — End: 2023-03-05

## 2023-02-28 NOTE — Progress Notes (Signed)
Virtual Visit via Video note  I connected with Ruth Tran on 03/06/23 at 1149 by video and verified that I am speaking with the correct person using two identifiers. Ruth Tran is currently located at home and husband, Ruth Tran and son is currently with her during visit. The provider, Dana Allan, MD is located in their office at time of visit.  I discussed the limitations, risks, security and privacy concerns of performing an evaluation and management service by video and the availability of in person appointments. I also discussed with the patient that there may be a patient responsible charge related to this service. The patient expressed understanding and agreed to proceed.  Subjective: PCP: Dana Allan, MD  Chief Complaint  Patient presents with   Pneumonia    02/20/2023 Pt is still coughing and sometimes coughs until she throws up.   Ear Fullness    Right ear can't hear out of it    HPI Discussed the use of AI scribe software for clinical note transcription with the patient, who gave verbal consent to proceed.  History of Present Illness The patient, born in [birth year], presents with a persistent cough and occasional productive sputum. She reports episodes of coughing so severe that it leads to vomiting. The cough has been ongoing despite completing two courses of Zithromax and one course of Augmentin for a diagnosis of pneumonia. The patient denies experiencing fevers, chills, headaches, chest pain, or shortness of breath. She reports occasional production of sputum, but not as frequently as before.  The patient also reports a sensation of pressure in her ears, but denies any pain. She has been seen by an ENT specialist who found no abnormalities in her ears. The patient has no known allergies, except for seasonal allergies. She tested negative for COVID-19.  The patient lives at home with her husband and 56-month-old son. She denies any recent changes in her environment or  exposure to animals. She has been able to eat and drink, albeit in smaller portions due to initial nausea. She has been monitoring her vital signs at home, reporting normal oxygen saturation and temperature.     ROS: Per HPI  Current Outpatient Medications:    Ascorbic Acid (VITAMIN C GUMMIE PO), Take by mouth daily., Disp: , Rfl:    Azelastine HCl 137 MCG/SPRAY SOLN, Place 2 sprays into both nostrils 2 (two) times daily., Disp: , Rfl:    Cholecalciferol 1.25 MG (50000 UT) capsule, Take 1 capsule (50,000 Units total) by mouth once a week. D3 1x per week x 6 months then take over the counter vitamin D3 2000 to 4000 IU daily starting month 7, Disp: 13 capsule, Rfl: 1   levocetirizine (XYZAL) 5 MG tablet, Take 5 mg by mouth daily., Disp: , Rfl:    mometasone (NASONEX) 50 MCG/ACT nasal spray, Place 2 sprays into the nose daily., Disp: , Rfl:    predniSONE (STERAPRED UNI-PAK 21 TAB) 10 MG (21) TBPK tablet, Use as directed. (Patient not taking: Reported on 03/06/2023), Disp: 21 each, Rfl: 0   SPRINTEC 28 0.25-35 MG-MCG tablet, SMARTSIG:1 Tablet(s) By Mouth Daily, Disp: , Rfl:    albuterol (VENTOLIN HFA) 108 (90 Base) MCG/ACT inhaler, Inhale 1-2 puffs into the lungs every 6 (six) hours as needed for wheezing or shortness of breath., Disp: 8 g, Rfl: 0  Observations/Objective: Physical Exam Pulmonary:     Effort: Pulmonary effort is normal.  Neurological:     Mental Status: She is alert and oriented to person,  place, and time. Mental status is at baseline.  Psychiatric:        Mood and Affect: Mood normal.        Behavior: Behavior normal.        Thought Content: Thought content normal.        Judgment: Judgment normal.    Assessment and Plan: Community acquired pneumonia of left upper lobe of lung Assessment & Plan: Persistent cough despite two courses of Zithromax and one course of Augmentin. No fever, chest pain, or shortness of breath. Chest X-ray on 9/24 showed consolidation with air  bronchograms in the posterior segment of the left upper lobe. -Start Prednisone taper pack for 5 days. -Prescribe Tessalon Perles for cough, with caution regarding potential toxicity to child in the home. -Follow-up appointment on Tuesday morning for physical examination and potential further investigation.  Orders: -     predniSONE; Use as directed. (Patient not taking: Reported on 03/06/2023)  Dispense: 21 each; Refill: 0  Ear pressure, right Assessment & Plan: Reported ear pressure, possibly related to persistent cough. No recent signs of ear infection noted by ENT. -Assess during follow-up appointment on Tuesday morning.     Follow Up Instructions: Return in about 5 days (around 03/05/2023) for PCP.   I discussed the assessment and treatment plan with the patient. The patient was provided an opportunity to ask questions and all were answered. The patient agreed with the plan and demonstrated an understanding of the instructions.   The patient was advised to call back or seek an in-person evaluation if the symptoms worsen or if the condition fails to improve as anticipated.  The above assessment and management plan was discussed with the patient. The patient verbalized understanding of and has agreed to the management plan. Patient is aware to call the clinic if symptoms persist or worsen. Patient is aware when to return to the clinic for a follow-up visit. Patient educated on when it is appropriate to go to the emergency department.     Dana Allan, MD

## 2023-02-28 NOTE — Patient Instructions (Addendum)
It was a pleasure meeting you today. Thank you for allowing me to take part in your health care.  Our goals for today as we discussed include:  Start Prednisone pack Tessalon Pearls for cough  Follow up Tues at 740 am   If you have any questions or concerns, please do not hesitate to call the office at [phone].  I look forward to our next visit and until then take care and stay safe.  Regards,   Dana Allan, MD   [facility]

## 2023-02-28 NOTE — Telephone Encounter (Signed)
Pt was seen on virtual today with Dr. Clent Ridges.

## 2023-03-05 ENCOUNTER — Encounter: Payer: Self-pay | Admitting: Internal Medicine

## 2023-03-05 ENCOUNTER — Inpatient Hospital Stay: Payer: BC Managed Care – PPO

## 2023-03-05 ENCOUNTER — Inpatient Hospital Stay: Payer: BC Managed Care – PPO | Attending: Internal Medicine | Admitting: Internal Medicine

## 2023-03-05 VITALS — BP 109/73 | HR 72 | Temp 97.2°F | Ht 65.0 in | Wt 159.8 lb

## 2023-03-05 DIAGNOSIS — Z801 Family history of malignant neoplasm of trachea, bronchus and lung: Secondary | ICD-10-CM | POA: Diagnosis not present

## 2023-03-05 DIAGNOSIS — D696 Thrombocytopenia, unspecified: Secondary | ICD-10-CM | POA: Insufficient documentation

## 2023-03-05 LAB — CBC WITH DIFFERENTIAL/PLATELET
Abs Immature Granulocytes: 0.03 10*3/uL (ref 0.00–0.07)
Basophils Absolute: 0 10*3/uL (ref 0.0–0.1)
Basophils Relative: 0 %
Eosinophils Absolute: 0 10*3/uL (ref 0.0–0.5)
Eosinophils Relative: 0 %
HCT: 42.4 % (ref 36.0–46.0)
Hemoglobin: 14.1 g/dL (ref 12.0–15.0)
Immature Granulocytes: 1 %
Lymphocytes Relative: 23 %
Lymphs Abs: 1.5 10*3/uL (ref 0.7–4.0)
MCH: 29.1 pg (ref 26.0–34.0)
MCHC: 33.3 g/dL (ref 30.0–36.0)
MCV: 87.4 fL (ref 80.0–100.0)
Monocytes Absolute: 0.2 10*3/uL (ref 0.1–1.0)
Monocytes Relative: 3 %
Neutro Abs: 4.8 10*3/uL (ref 1.7–7.7)
Neutrophils Relative %: 73 %
Platelets: 249 10*3/uL (ref 150–400)
RBC: 4.85 MIL/uL (ref 3.87–5.11)
RDW: 12.1 % (ref 11.5–15.5)
WBC: 6.5 10*3/uL (ref 4.0–10.5)
nRBC: 0 % (ref 0.0–0.2)

## 2023-03-05 LAB — COMPREHENSIVE METABOLIC PANEL
ALT: 20 U/L (ref 0–44)
AST: 23 U/L (ref 15–41)
Albumin: 3.9 g/dL (ref 3.5–5.0)
Alkaline Phosphatase: 80 U/L (ref 38–126)
Anion gap: 7 (ref 5–15)
BUN: 17 mg/dL (ref 6–20)
CO2: 28 mmol/L (ref 22–32)
Calcium: 9.1 mg/dL (ref 8.9–10.3)
Chloride: 102 mmol/L (ref 98–111)
Creatinine, Ser: 0.73 mg/dL (ref 0.44–1.00)
GFR, Estimated: >60 mL/min (ref >60)
Glucose, Bld: 105 mg/dL — ABNORMAL HIGH (ref 70–99)
Potassium: 4.6 mmol/L (ref 3.5–5.1)
Sodium: 137 mmol/L (ref 135–145)
Total Bilirubin: 0.8 mg/dL (ref 0.3–1.2)
Total Protein: 7.4 g/dL (ref 6.5–8.1)

## 2023-03-05 LAB — TECHNOLOGIST SMEAR REVIEW
Plt Morphology: NORMAL
RBC MORPHOLOGY: NORMAL
WBC MORPHOLOGY: NORMAL

## 2023-03-05 LAB — LACTATE DEHYDROGENASE: LDH: 98 U/L (ref 98–192)

## 2023-03-05 LAB — IMMATURE PLATELET FRACTION: Immature Platelet Fraction: 3.8 % (ref 1.2–8.6)

## 2023-03-05 LAB — VITAMIN B12: Vitamin B-12: 689 pg/mL (ref 180–914)

## 2023-03-05 NOTE — Progress Notes (Signed)
 No questions/concerns today.

## 2023-03-05 NOTE — Assessment & Plan Note (Addendum)
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  I had a long discussion with patient regarding multiple etiologies including but not limited to liver  disease/medications/ autoimmune disease-ITP etc.  Less likely any malignant causes. Discussed the role of a bone marrow biopsy for further evaluation of hematologic disorder.  However hold off given low suspicion for any malignant process.   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  ITP is usually treated with steroids.  Hold off any steroids at this time as patient is fairly asymptomatic.  Interestingly patient has been treated with steroids for recent pneumonia.  # Today- Recommend further work-up including-CBC CMP LDH; review of peripheral smear.   Thank you for allowing me to participate in the care of your pleasant patient. Please do not hesitate to contact me with questions or concerns in the interim.  # DISPOSITION: # labs today- ordered.  # follow up in 6 months MD; labs- cbc/bmp-- Dr.B  Addendum: Repeat platelet count today is 249 [recent steroids for unrelated cause/pneumonia].  Again suggestive of ITP.  Again follow-up with therapy until platelets trending down to 50 or low.

## 2023-03-05 NOTE — Progress Notes (Signed)
[facility] CONSULT NOTE  Patient Care Team: Dana Allan, MD as PCP - General (Family Medicine) Earna Coder, MD as Consulting Physician (Oncology)  CHIEF COMPLAINTS/PURPOSE OF CONSULTATION: Thrombocytopenia  HISTORY OF PRESENTING ILLNESS: Patient ambulating-independently.  Alone/Accompanied by husband.   Ruth Tran 32 y.o.  femalereferred to [place] for further evaluation of thrombocytopenia.  Hx of intermittent thrombocytopenia [Hx of appendix surgery- infection].  Patient has history of easy bruising: chronic.   Bleeding: none  No new medications.  Alcohol:  none   Antiplatelet therapy/blood thinners: none   Hepatitis/HIV: negative in past.   Liver disease/CHF: none  Review of Systems  Constitutional:  Negative for chills, diaphoresis, fever, malaise/fatigue and weight loss.  HENT:  Negative for nosebleeds and sore throat.   Eyes:  Negative for double vision.  Respiratory:  Negative for cough, hemoptysis, sputum production, shortness of breath and wheezing.   Cardiovascular:  Negative for chest pain, palpitations, orthopnea and leg swelling.  Gastrointestinal:  Negative for abdominal pain, blood in stool, constipation, diarrhea, heartburn, melena, nausea and vomiting.  Genitourinary:  Negative for dysuria, frequency and urgency.  Musculoskeletal:  Negative for back pain and joint pain.  Skin: Negative.  Negative for itching and rash.  Neurological:  Negative for dizziness, tingling, focal weakness, weakness and headaches.  Endo/Heme/Allergies:  Does not bruise/bleed easily.  Psychiatric/Behavioral:  Negative for depression. The patient is not nervous/anxious and does not have insomnia.      MEDICAL HISTORY:  Past Medical History:  Diagnosis Date   Allergy    Chicken pox    Complication of anesthesia    WITH HER WISDOM TEETH SURGERY SHE HAD TO GO TO THE OR TO HAVE THIS DONE DUE TO TACHYCARDIA DUE TO GRAVES DISEASE   Complication of anesthesia     Dysmenorrhea    Family history of endometriosis    mother,sister   Graves disease    graves, hyperthyroidism    Headache    MIGRAINES   Migraine    Ovarian cyst    Panic attack    Third degree laceration of perineum during delivery, postpartum 05/13/2021   UTI (urinary tract infection)     SURGICAL HISTORY: Past Surgical History:  Procedure Laterality Date   APPENDECTOMY  2005   WITH DRAIN PLACED DUE TO ABSCESS 2005 [facility]   BREAST BIOPSY Right 2017   had a reaction to xylo or xylo with epi where her throat closed up   BREAST BIOPSY Right 09/06/2022   [place] RT BREAST BX W LOC DEV 1ST LESION IMG BX SPEC US GUIDE 09/06/2022 [facility]   CHROMOPERTUBATION Right 08/12/2018   Procedure: CHROMOPERTUBATION;  Surgeon: Christeen Douglas, MD;  Location: [facility];  Service: Gynecology;  Laterality: Right;   FOOT SURGERY Bilateral 2008   toes going outward surgery to turn toes inward 2010    LAPAROSCOPIC OVARIAN CYSTECTOMY Right 08/12/2018   Procedure: LAPAROSCOPIC OVARIAN CYSTECTOMY;  Surgeon: Christeen Douglas, MD;  Location: [facility];  Service: Gynecology;  Laterality: Right;   LYSIS OF ADHESION Right 08/12/2018   Procedure: LYSIS OF ADHESIONS Lysis of Omental Adhesions, right cecum adhesions, right fallopian tube and right adnexal adhesions;  Surgeon: Christeen Douglas, MD;  Location: [facility];  Service: Gynecology;  Laterality: Right;   TONSILLECTOMY AND ADENOIDECTOMY N/A 04/17/2017   Procedure: TONSILLECTOMY AND POSSIBLE  ADENOIDECTOMY;  Surgeon: Linus Salmons, MD;  Location: [facility];  Service: ENT;  Laterality: N/A;   WISDOM TOOTH EXTRACTION     2016    SOCIAL  HISTORY: Social History   Socioeconomic History   Marital status: Married    Spouse name: Not on file   Number of children: 0   Years of education: Not on file   Highest education level: Bachelor's degree (e.g., BA, AB, BS)  Occupational History   Not on file  Tobacco Use   Smoking status: Never   Smokeless tobacco:  Never  Vaping Use   Vaping status: Never Used  Substance and Sexual Activity   Alcohol use: Not Currently    Comment: Occasional 1-2 drinks every couple months   Drug use: No   Sexual activity: Not on file  Other Topics Concern   Not on file  Social History Narrative   BA Degree [employer]   Wears seat belt,    Guns at home    Safe in relationship    Does not exercise    Caffeine: cup of tea daily   Getting married 04/2020 new last name Winders   4 month old son Eudelia Bunch as of 09/2021    Social Determinants of Health   Financial Resource Strain: Low Risk  (03/01/2023)   Overall Financial Resource Strain (CARDIA)    Difficulty of Paying Living Expenses: Not hard at all  Food Insecurity: No Food Insecurity (03/05/2023)   Hunger Vital Sign    Worried About Running Out of Food in the Last Year: Never true    Ran Out of Food in the Last Year: Never true  Transportation Needs: No Transportation Needs (03/05/2023)   PRAPARE - Administrator, Civil Service (Medical): No    Lack of Transportation (Non-Medical): No  Physical Activity: Unknown (03/01/2023)   Exercise Vital Sign    Days of Exercise per Week: Patient declined    Minutes of Exercise per Session: Not on file  Stress: No Stress Concern Present (03/01/2023)   [employer] of Occupational Health - Occupational Stress Questionnaire    Feeling of Stress : Not at all  Social Connections: Socially Integrated (03/01/2023)   Social Connection and Isolation Panel [NHANES]    Frequency of Communication with Friends and Family: More than three times a week    Frequency of Social Gatherings with Friends and Family: More than three times a week    Attends Religious Services: More than 4 times per year    Active Member of [employer] or Organizations: Yes    Attends Engineer, structural: More than 4 times per year    Marital Status: Married  Catering manager Violence: Not At Risk (03/05/2023)   Humiliation, Afraid, Rape, and Kick  questionnaire    Fear of Current or Ex-Partner: No    Emotionally Abused: No    Physically Abused: No    Sexually Abused: No    FAMILY HISTORY: Family History  Problem Relation Age of Onset   Hypertension Mother    Migraines Mother    Diabetes Father    Hyperlipidemia Father    Learning disabilities Sister    Cancer Maternal Grandmother        lung (small cell) >liver>pancreas smoker    Migraines Maternal Grandmother    Graves' disease Maternal Grandfather    Miscarriages / Stillbirths Maternal Grandfather    Cancer Maternal Grandfather        prostate/bladder    Ovarian cancer Paternal Grandmother    Cancer Paternal Grandmother        brain   Cancer Paternal Grandfather        skin cancer  Graves' disease Other    Breast cancer Neg Hx    Colon cancer Neg Hx    Heart disease Neg Hx     ALLERGIES:  is allergic to lidocaine, lidocaine-epinephrine, toradol [ketorolac tromethamine], tramadol, latex, linzess [linaclotide], and tape.  MEDICATIONS:  Current Outpatient Medications  Medication Sig Dispense Refill   Ascorbic Acid (VITAMIN C GUMMIE PO) Take by mouth daily.     Azelastine HCl 137 MCG/SPRAY SOLN Place 2 sprays into both nostrils 2 (two) times daily.     Cholecalciferol 1.25 MG (50000 UT) capsule Take 1 capsule (50,000 Units total) by mouth once a week. D3 1x per week x 6 months then take over the counter vitamin D3 2000 to 4000 IU daily starting month 7 13 capsule 1   levocetirizine (XYZAL) 5 MG tablet Take 5 mg by mouth daily.     mometasone (NASONEX) 50 MCG/ACT nasal spray Place 2 sprays into the nose daily.     predniSONE (STERAPRED UNI-PAK 21 TAB) 10 MG (21) TBPK tablet Use as directed. 21 each 0   SPRINTEC 28 0.25-35 MG-MCG tablet SMARTSIG:1 Tablet(s) By Mouth Daily     No current facility-administered medications for this visit.    PHYSICAL EXAMINATION:  Vitals:   03/05/23 1348  BP: 109/73  Pulse: 72  Temp: (!) 97.2 F (36.2 C)  SpO2: 100%    Filed Weights   03/05/23 1348  Weight: 159 lb 12.8 oz (72.5 kg)    Physical Exam Vitals and nursing note reviewed.  HENT:     Head: Normocephalic and atraumatic.     Mouth/Throat:     Pharynx: Oropharynx is clear.  Eyes:     Extraocular Movements: Extraocular movements intact.     Pupils: Pupils are equal, round, and reactive to light.  Cardiovascular:     Rate and Rhythm: Normal rate and regular rhythm.  Pulmonary:     Comments: Decreased breath sounds bilaterally.  Abdominal:     Palpations: Abdomen is soft.  Musculoskeletal:        General: Normal range of motion.     Cervical back: Normal range of motion.  Skin:    General: Skin is warm.  Neurological:     General: No focal deficit present.     Mental Status: She is alert and oriented to person, place, and time.  Psychiatric:        Behavior: Behavior normal.        Judgment: Judgment normal.      LABORATORY DATA:  I have reviewed the data as listed Lab Results  Component Value Date   WBC 6.5 03/05/2023   HGB 14.1 03/05/2023   HCT 42.4 03/05/2023   MCV 87.4 03/05/2023   PLT 249 03/05/2023   Recent Labs    11/06/22 1108 03/05/23 1455  NA 141 137  K 4.4 4.6  CL 108 102  CO2 26 28  GLUCOSE 77 105*  BUN 13 17  CREATININE 0.65 0.73  CALCIUM 8.8 9.1  GFRNONAA  --  >60  PROT 6.1 7.4  ALBUMIN 3.7 3.9  AST 19 23  ALT 10 20  ALKPHOS 40 80  BILITOT 0.7 0.8    RADIOGRAPHIC STUDIES: I have personally reviewed the radiological images as listed and agreed with the findings in the report. No results found.  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  I had a long discussion with patient regarding multiple etiologies including but not limited to liver  disease/medications/ autoimmune disease-ITP etc.  Less likely any malignant causes. Discussed the role of a bone marrow biopsy for further evaluation of hematologic disorder.   However hold off given low suspicion for any malignant process.   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  ITP is usually treated with steroids.  Hold off any steroids at this time as patient is fairly asymptomatic.  Interestingly patient has been treated with steroids for recent pneumonia.  # Today- Recommend further work-up including-CBC CMP LDH; review of peripheral smear.   Thank you for allowing me to participate in the care of your pleasant patient. Please do not hesitate to contact me with questions or concerns in the interim.  # DISPOSITION: # labs today- ordered.  # follow up in 6 months MD; labs- cbc/bmp-- Dr.B  Addendum: Repeat platelet count today is 289 [recent steroids for unrelated cause/pneumonia].  Again suggestive of ITP.  Again follow-up with therapy until platelets trending down to 50 or low.  All questions were answered. The patient knows to call the clinic with any problems, questions or concerns.    Earna Coder, MD 03/05/2023 3:54 PM

## 2023-03-06 ENCOUNTER — Encounter: Payer: Self-pay | Admitting: Family Medicine

## 2023-03-06 ENCOUNTER — Ambulatory Visit: Payer: BC Managed Care – PPO | Admitting: Family Medicine

## 2023-03-06 VITALS — BP 106/60 | HR 72 | Temp 98.2°F | Resp 16 | Ht 65.0 in | Wt 158.4 lb

## 2023-03-06 DIAGNOSIS — H6991 Unspecified Eustachian tube disorder, right ear: Secondary | ICD-10-CM | POA: Insufficient documentation

## 2023-03-06 DIAGNOSIS — J189 Pneumonia, unspecified organism: Secondary | ICD-10-CM

## 2023-03-06 DIAGNOSIS — H938X1 Other specified disorders of right ear: Secondary | ICD-10-CM | POA: Insufficient documentation

## 2023-03-06 MED ORDER — ALBUTEROL SULFATE HFA 108 (90 BASE) MCG/ACT IN AERS
1.0000 | INHALATION_SPRAY | Freq: Four times a day (QID) | RESPIRATORY_TRACT | 0 refills | Status: DC | PRN
Start: 2023-03-06 — End: 2023-09-03

## 2023-03-06 NOTE — Assessment & Plan Note (Signed)
Improvement noted with prednisone treatment. Persistent cough but overall feeling better. No fever, no productive cough. Lung sounds suggest possible residual bronchitis. -Prescribe Albuterol inhaler, 1-2 puffs as needed for cough. -Repeat chest x-ray in a couple of weeks to assess resolution.

## 2023-03-06 NOTE — Assessment & Plan Note (Signed)
Persistent ear pressure despite use of nasal sprays. No signs of infection or fluid on examination. -Continue current nasal sprays as prescribed by ENT. -Consider systemic antihistamines such as Zyrtec or Allegra. -Follow up with ENT for further evaluation and management.

## 2023-03-06 NOTE — Progress Notes (Signed)
SUBJECTIVE:   Chief Complaint  Patient presents with   Pneumonia    Follow up from 2 weeks ago   HPI Presents to clinic for follow up CAP  Discussed the use of AI scribe software for clinical note transcription with the patient, who gave verbal consent to proceed.  History of Present Illness The patient, previously diagnosed with pneumonia, presented for a follow-up consultation. They reported a significant improvement in their cough, although it had not completely resolved. The patient noted that they had completed a course of prednisone, which they tolerated well. Despite the improvement, the patient still experienced episodes of intense coughing. They denied any fever or productive cough.  Two weeks prior to the consultation, the patient had a chest x-ray. They reported feeling overall better since the x-ray, with the primary remaining symptom being the cough.  The patient also reported persistent ear pressure, which had been present for approximately a week at the time of the consultation. Despite using two prescribed nasal sprays as directed, the ear pressure persisted. The patient also used a neti pot at night, but it did not seem to alleviate the symptoms.  The patient's medical history includes allergies, for which they were advised to use one of the nasal sprays year-round. They had previously tried Flonase, but it had caused migraines. The patient also reported a history of respiratory issues, for which they had used an albuterol inhaler in the past.  In terms of their pneumonia, the patient estimated that they felt about 95% better compared to when they first fell ill. They had been treated with Augmentin and Zithromax, which would have covered any potential ear infection. The patient denied any chest pain, shortness of breath, wheezing, nausea, vomiting, or difficulty eating or drinking. They also denied any shortness of breath when walking.    PERTINENT PMH / PSH: As  above  OBJECTIVE:  BP 106/60   Pulse 72   Temp 98.2 F (36.8 C)   Resp 16   Ht 5\' 5"  (1.651 m)   Wt 158 lb 6 oz (71.8 kg)   LMP 02/24/2023 (Exact Date)   SpO2 99%   Breastfeeding No   BMI 26.35 kg/m    Physical Exam Vitals reviewed.  Constitutional:      General: She is not in acute distress.    Appearance: Normal appearance. She is normal weight. She is not ill-appearing, toxic-appearing or diaphoretic.  HENT:     Right Ear: Tympanic membrane, ear canal and external ear normal.     Left Ear: Tympanic membrane, ear canal and external ear normal.     Mouth/Throat:     Mouth: Mucous membranes are moist.  Eyes:     General:        Right eye: No discharge.        Left eye: No discharge.     Conjunctiva/sclera: Conjunctivae normal.  Cardiovascular:     Rate and Rhythm: Normal rate and regular rhythm.     Heart sounds: Normal heart sounds.  Pulmonary:     Effort: Pulmonary effort is normal. No respiratory distress.     Breath sounds: Wheezing present. No rhonchi or rales.  Lymphadenopathy:     Cervical: No cervical adenopathy.  Skin:    General: Skin is warm and dry.  Neurological:     Mental Status: She is alert and oriented to person, place, and time. Mental status is at baseline.  Psychiatric:        Mood and Affect: Mood  normal.        Behavior: Behavior normal.        Thought Content: Thought content normal.        Judgment: Judgment normal.        03/06/2023    7:45 AM 03/05/2023    2:01 PM 11/08/2022    1:43 PM 07/06/2022    1:01 PM 10/20/2021    2:57 PM  Depression screen PHQ 2/9  Decreased Interest 0 0 0 0 1  Down, Depressed, Hopeless 0 0 0 0 0  PHQ - 2 Score 0 0 0 0 1  Altered sleeping 0  1 1   Tired, decreased energy 0  1 1   Change in appetite 0  0 0   Feeling bad or failure about yourself  0  0 0   Trouble concentrating 0  0 0   Moving slowly or fidgety/restless 0  0 0   Suicidal thoughts 0  0 0   PHQ-9 Score 0  2 2   Difficult doing work/chores  Not difficult at all  Not difficult at all Not difficult at all       03/06/2023    7:46 AM 11/08/2022    1:44 PM 07/06/2022    1:02 PM 09/12/2019    2:36 PM  GAD 7 : Generalized Anxiety Score  Nervous, Anxious, on Edge 0 0 2 1  Control/stop worrying 0 0 1 0  Worry too much - different things 0 0 2 0  Trouble relaxing 0 0 1 0  Restless 0 0 1 0  Easily annoyed or irritable 0 0 1 0  Afraid - awful might happen 0 0 1 0  Total GAD 7 Score 0 0 9 1  Anxiety Difficulty Not difficult at all Not difficult at all Not difficult at all Not difficult at all    ASSESSMENT/PLAN:  Community acquired pneumonia of left upper lobe of lung Assessment & Plan: Improvement noted with prednisone treatment. Persistent cough but overall feeling better. No fever, no productive cough. Lung sounds suggest possible residual bronchitis. -Prescribe Albuterol inhaler, 1-2 puffs as needed for cough. -Repeat chest x-ray in a couple of weeks to assess resolution.  Orders: -     Albuterol Sulfate HFA; Inhale 1-2 puffs into the lungs every 6 (six) hours as needed for wheezing or shortness of breath.  Dispense: 8 g; Refill: 0  Eustachian tube dysfunction, right Assessment & Plan: Persistent ear pressure despite use of nasal sprays. No signs of infection or fluid on examination. -Continue current nasal sprays as prescribed by ENT. -Consider systemic antihistamines such as Zyrtec or Allegra. -Follow up with ENT for further evaluation and management.     PDMP reviewed  Return in about 2 weeks (around 03/20/2023) for PCP.  Dana Allan, MD

## 2023-03-06 NOTE — Patient Instructions (Signed)
It was a pleasure meeting you today. Thank you for allowing me to take part in your health care.  Our goals for today as we discussed include:  Use albuterol 1-2 puffs ever 6 hours for cough or shortness of breath/wheezing  Follow up in 2 weeks  Follow up with ENT as needed  Follow up with Hematology as needed   If you have any questions or concerns, please do not hesitate to call the office at [phone].  I look forward to our next visit and until then take care and stay safe.  Regards,   Dana Allan, MD   [facility]

## 2023-03-06 NOTE — Assessment & Plan Note (Signed)
Persistent cough despite two courses of Zithromax and one course of Augmentin. No fever, chest pain, or shortness of breath. Chest X-ray on 9/24 showed consolidation with air bronchograms in the posterior segment of the left upper lobe. -Start Prednisone taper pack for 5 days. -Prescribe Tessalon Perles for cough, with caution regarding potential toxicity to child in the home. -Follow-up appointment on Tuesday morning for physical examination and potential further investigation.

## 2023-03-06 NOTE — Assessment & Plan Note (Signed)
Reported ear pressure, possibly related to persistent cough. No recent signs of ear infection noted by ENT. -Assess during follow-up appointment on Tuesday morning.

## 2023-03-20 ENCOUNTER — Ambulatory Visit: Payer: BC Managed Care – PPO | Admitting: Family Medicine

## 2023-03-20 ENCOUNTER — Ambulatory Visit (INDEPENDENT_AMBULATORY_CARE_PROVIDER_SITE_OTHER): Payer: BC Managed Care – PPO

## 2023-03-20 ENCOUNTER — Encounter: Payer: Self-pay | Admitting: Family Medicine

## 2023-03-20 VITALS — BP 98/60 | HR 88 | Temp 97.9°F | Resp 16 | Ht 65.0 in | Wt 160.5 lb

## 2023-03-20 DIAGNOSIS — E0500 Thyrotoxicosis with diffuse goiter without thyrotoxic crisis or storm: Secondary | ICD-10-CM | POA: Diagnosis not present

## 2023-03-20 DIAGNOSIS — J189 Pneumonia, unspecified organism: Secondary | ICD-10-CM | POA: Diagnosis not present

## 2023-03-20 DIAGNOSIS — D696 Thrombocytopenia, unspecified: Secondary | ICD-10-CM

## 2023-03-20 NOTE — Assessment & Plan Note (Signed)
Platelet count improved. No active bleeding. -Continue monitoring platelet count. -Follow up with oncologist in six months.

## 2023-03-20 NOTE — Assessment & Plan Note (Signed)
Improved with two rounds of antibiotics, one course of steroids and albuterol inhaler. No current distress noted on examination. -Order repeat chest x-ray for comparison with previous study done 4 weeks ago. -Continue albuterol inhaler as needed for shortness of breath.

## 2023-03-20 NOTE — Patient Instructions (Signed)
It was a pleasure meeting you today. Thank you for allowing me to take part in your health care.  Our goals for today as we discussed include:  Glad you are feeling better  Will get chest xray today  Recommend Flu and Pneumonia 20 vaccine  Follow up as needed   This is a list of the screening recommended for you and due dates:  Health Maintenance  Topic Date Due   COVID-19 Vaccine (1) Never done   Flu Shot  08/27/2023*   Pap Smear  06/24/2024   DTaP/Tdap/Td vaccine (10 - Td or Tdap) 02/19/2031   Hepatitis C Screening  Completed   HIV Screening  Completed   HPV Vaccine  Aged Out  *Topic was postponed. The date shown is not the original due date.      If you have any questions or concerns, please do not hesitate to call the office at [phone].  I look forward to our next visit and until then take care and stay safe.  Regards,   Dana Allan, MD   [facility]

## 2023-03-20 NOTE — Progress Notes (Signed)
SUBJECTIVE:   Chief Complaint  Patient presents with   Pneumonia    2 week follow up    HPI Presents for follow up CAP  Discussed the use of AI scribe software for clinical note transcription with the patient, who gave verbal consent to proceed.  History of Present Illness The patient, with a history of Graves' disease, presented for a follow-up visit after recent treatment for CAP. They reported significant improvement in their breathing following two rounds of antibiotics and one course of steroids, but significantly improved after use of albuterol inhaler.   The patient also has a history of idiopathic thrombocytopenic purpura (ITP), which was recently evaluated by a hematologist. The patient's platelet count had been steadily decreasing, but it has since improved.  The patient also has a history of gynecological issues, including heavy menstrual bleeding, which has led to discussions about a potential hysterectomy. However, no decision has been made at this time.  Overall, the patient's condition has improved following recent treatments.    PERTINENT PMH / PSH: As above  OBJECTIVE:  BP 98/60   Pulse 88   Temp 97.9 F (36.6 C)   Resp 16   Ht 5\' 5"  (1.651 m)   Wt 160 lb 8 oz (72.8 kg)   LMP 02/24/2023 (Exact Date)   SpO2 99%   Breastfeeding No   BMI 26.71 kg/m    Physical Exam Vitals reviewed.  Constitutional:      General: She is not in acute distress.    Appearance: Normal appearance. She is normal weight. She is not ill-appearing, toxic-appearing or diaphoretic.  Eyes:     General:        Right eye: No discharge.        Left eye: No discharge.     Conjunctiva/sclera: Conjunctivae normal.  Cardiovascular:     Rate and Rhythm: Normal rate and regular rhythm.     Heart sounds: Normal heart sounds.  Pulmonary:     Effort: Pulmonary effort is normal.     Breath sounds: Normal breath sounds.  Abdominal:     General: Bowel sounds are normal.   Musculoskeletal:        General: Normal range of motion.  Skin:    General: Skin is warm and dry.  Neurological:     General: No focal deficit present.     Mental Status: She is alert and oriented to person, place, and time. Mental status is at baseline.  Psychiatric:        Mood and Affect: Mood normal.        Behavior: Behavior normal.        Thought Content: Thought content normal.        Judgment: Judgment normal.        03/20/2023    1:38 PM 03/06/2023    7:45 AM 03/05/2023    2:01 PM 11/08/2022    1:43 PM 07/06/2022    1:01 PM  Depression screen PHQ 2/9  Decreased Interest 0 0 0 0 0  Down, Depressed, Hopeless 0 0 0 0 0  PHQ - 2 Score 0 0 0 0 0  Altered sleeping 0 0  1 1  Tired, decreased energy 0 0  1 1  Change in appetite 0 0  0 0  Feeling bad or failure about yourself  0 0  0 0  Trouble concentrating 0 0  0 0  Moving slowly or fidgety/restless 0 0  0 0  Suicidal thoughts 0 0  0 0  PHQ-9 Score 0 0  2 2  Difficult doing work/chores Not difficult at all Not difficult at all  Not difficult at all Not difficult at all      03/20/2023    1:39 PM 03/06/2023    7:46 AM 11/08/2022    1:44 PM 07/06/2022    1:02 PM  GAD 7 : Generalized Anxiety Score  Nervous, Anxious, on Edge 0 0 0 2  Control/stop worrying 0 0 0 1  Worry too much - different things 0 0 0 2  Trouble relaxing 0 0 0 1  Restless 0 0 0 1  Easily annoyed or irritable 0 0 0 1  Afraid - awful might happen 0 0 0 1  Total GAD 7 Score 0 0 0 9  Anxiety Difficulty Not difficult at all Not difficult at all Not difficult at all Not difficult at all    ASSESSMENT/PLAN:  Community acquired pneumonia of left upper lobe of lung Assessment & Plan: Improved with two rounds of antibiotics, one course of steroids and albuterol inhaler. No current distress noted on examination. -Order repeat chest x-ray for comparison with previous study done 4 weeks ago. -Continue albuterol inhaler as needed for shortness of  breath.  Orders: -     DG Chest 2 View; Future  Thrombocytopenia (HCC) Assessment & Plan: Platelet count improved. No active bleeding. -Continue monitoring platelet count. -Follow up with oncologist in six months.   Graves disease Assessment & Plan: Stable. -Continue current management.    General Health Maintenance -Discussed pneumonia vaccine, recommended due to history of pneumonia and immunocompromised state from Graves' disease. Patient to consider. -Flu vaccine offered, patient declined.    PDMP reviewed   Return if symptoms worsen or fail to improve, for PCP.  Dana Allan, MD

## 2023-03-20 NOTE — Assessment & Plan Note (Signed)
Stable  Continue current management

## 2023-03-23 ENCOUNTER — Encounter: Payer: Self-pay | Admitting: Family Medicine

## 2023-03-26 ENCOUNTER — Ambulatory Visit
Admission: RE | Admit: 2023-03-26 | Discharge: 2023-03-26 | Payer: BC Managed Care – PPO | Source: Ambulatory Visit | Attending: Family Medicine | Admitting: Family Medicine

## 2023-03-26 VITALS — BP 110/62 | HR 111 | Temp 99.7°F | Resp 18

## 2023-03-26 DIAGNOSIS — M791 Myalgia, unspecified site: Secondary | ICD-10-CM | POA: Diagnosis not present

## 2023-03-26 DIAGNOSIS — Z1152 Encounter for screening for COVID-19: Secondary | ICD-10-CM | POA: Insufficient documentation

## 2023-03-26 DIAGNOSIS — R600 Localized edema: Secondary | ICD-10-CM | POA: Insufficient documentation

## 2023-03-26 DIAGNOSIS — Z862 Personal history of diseases of the blood and blood-forming organs and certain disorders involving the immune mechanism: Secondary | ICD-10-CM | POA: Insufficient documentation

## 2023-03-26 DIAGNOSIS — M2550 Pain in unspecified joint: Secondary | ICD-10-CM | POA: Insufficient documentation

## 2023-03-26 DIAGNOSIS — E0500 Thyrotoxicosis with diffuse goiter without thyrotoxic crisis or storm: Secondary | ICD-10-CM | POA: Insufficient documentation

## 2023-03-26 DIAGNOSIS — D696 Thrombocytopenia, unspecified: Secondary | ICD-10-CM | POA: Diagnosis not present

## 2023-03-26 DIAGNOSIS — R509 Fever, unspecified: Secondary | ICD-10-CM | POA: Insufficient documentation

## 2023-03-26 DIAGNOSIS — Z8349 Family history of other endocrine, nutritional and metabolic diseases: Secondary | ICD-10-CM | POA: Insufficient documentation

## 2023-03-26 LAB — RESP PANEL BY RT-PCR (FLU A&B, COVID) ARPGX2
Influenza A by PCR: NEGATIVE
Influenza B by PCR: NEGATIVE
SARS Coronavirus 2 by RT PCR: NEGATIVE

## 2023-03-26 LAB — CBC WITH DIFFERENTIAL/PLATELET
Abs Immature Granulocytes: 0.02 10*3/uL (ref 0.00–0.07)
Basophils Absolute: 0 10*3/uL (ref 0.0–0.1)
Basophils Relative: 0 %
Eosinophils Absolute: 0 10*3/uL (ref 0.0–0.5)
Eosinophils Relative: 0 %
HCT: 38.1 % (ref 36.0–46.0)
Hemoglobin: 13 g/dL (ref 12.0–15.0)
Immature Granulocytes: 0 %
Lymphocytes Relative: 13 %
Lymphs Abs: 0.7 10*3/uL (ref 0.7–4.0)
MCH: 29.2 pg (ref 26.0–34.0)
MCHC: 34.1 g/dL (ref 30.0–36.0)
MCV: 85.6 fL (ref 80.0–100.0)
Monocytes Absolute: 0.5 10*3/uL (ref 0.1–1.0)
Monocytes Relative: 10 %
Neutro Abs: 3.8 10*3/uL (ref 1.7–7.7)
Neutrophils Relative %: 77 %
Platelets: 106 10*3/uL — ABNORMAL LOW (ref 150–400)
RBC: 4.45 MIL/uL (ref 3.87–5.11)
RDW: 13.3 % (ref 11.5–15.5)
WBC: 5 10*3/uL (ref 4.0–10.5)
nRBC: 0 % (ref 0.0–0.2)

## 2023-03-26 LAB — BASIC METABOLIC PANEL
Anion gap: 9 (ref 5–15)
BUN: 7 mg/dL (ref 6–20)
CO2: 23 mmol/L (ref 22–32)
Calcium: 8.6 mg/dL — ABNORMAL LOW (ref 8.9–10.3)
Chloride: 100 mmol/L (ref 98–111)
Creatinine, Ser: 0.44 mg/dL (ref 0.44–1.00)
GFR, Estimated: >60 mL/min (ref >60)
Glucose, Bld: 89 mg/dL (ref 70–99)
Potassium: 4.1 mmol/L (ref 3.5–5.1)
Sodium: 132 mmol/L — ABNORMAL LOW (ref 135–145)

## 2023-03-26 LAB — MONONUCLEOSIS SCREEN: Mono Screen: NEGATIVE

## 2023-03-26 NOTE — Discharge Instructions (Addendum)
Your COVID, flu and monotest were all negative.  Your platelet count is low at 106 and your sodium level was somewhat low as well at 132.  Given your history of autoimmune disease I am concerned that you may have an acute rheumatologic issue.  Follow-up in the emergency department.  You have been advised to follow up immediately in the emergency department for concerning signs or symptoms as discussed during your visit. If you declined EMS transport, please have a family member take you directly to the ED at this time. Do not delay.

## 2023-03-26 NOTE — ED Notes (Signed)
Patient is being discharged from the Urgent Care and sent to the Emergency Department via POV . Per Dr.Brimage, patient is in need of higher level of care due to fever,autoimmune disease & thrombocytopenia. Patient is aware and verbalizes understanding of plan of care.  Vitals:   03/26/23 1304  BP: 110/62  Pulse: (!) 111  Resp: 18  Temp: 99.7 F (37.6 C)  SpO2: 99%

## 2023-03-26 NOTE — ED Triage Notes (Signed)
Pt c/o left side jaw pain, neck pain, bodyaches and fever x 3 days.

## 2023-03-26 NOTE — ED Provider Notes (Signed)
[facility]    CSN: [id number] Arrival date & time: 03/26/23  1248      History   Chief Complaint Chief Complaint  Patient presents with   Chills   Generalized Body Aches   Jaw Pain   Neck Pain    HPI Ruth Tran is a 32 y.o. female.   HPI  History obtained from the patient. Ruth Tran presents for fever, chills, body aches that started last Thursday. Temperature didn't get above 100.4 f.  Sunday started having facial swelling that is getting worse. It hurts her to talk. No one else has similar sx. She is not able to eat or drink anything hot or cold.  No known sick contacts.    Had some vomiting on Friday.    Past Medical History:  Diagnosis Date   Allergy    Chicken pox    Complication of anesthesia    WITH HER WISDOM TEETH SURGERY SHE HAD TO GO TO THE OR TO HAVE THIS DONE DUE TO TACHYCARDIA DUE TO GRAVES DISEASE   Complication of anesthesia    Dysmenorrhea    Family history of endometriosis    mother,sister   Graves disease    graves, hyperthyroidism    Headache    MIGRAINES   Migraine    Ovarian cyst    Panic attack    Third degree laceration of perineum during delivery, postpartum 05/13/2021   UTI (urinary tract infection)     Patient Active Problem List   Diagnosis Date Noted   CAP (community acquired pneumonia) 03/06/2023   Ear pressure, right 03/06/2023   Eustachian tube dysfunction, right 03/06/2023   Thrombocytopenia (HCC) 11/18/2022   Need for hepatitis C screening test 11/18/2022   Overweight (BMI 25.0-29.9) 07/09/2022   Chronic sinusitis 07/09/2022   Abnormal thyroid function test 11/06/2021   Vitamin D deficiency 11/06/2021   Elevated TSH 10/31/2021   Forceps delivery 05/13/2021   Encounter for supervision of normal pregnancy 09/30/2020   Annual physical exam 03/11/2020   Patellofemoral syndrome of right knee 12/19/2019   Chronic pain of right knee 09/12/2019   Allergic rhinitis 09/12/2019   Ovarian cyst 10/22/2018    Migraine without aura and without status migrainosus, not intractable 09/19/2017   Hyperthyroidism 09/19/2017   Seasonal allergies 09/19/2017   Graves disease 01/22/2014   Vitamin D deficiency disease 01/22/2014    Past Surgical History:  Procedure Laterality Date   APPENDECTOMY  2005   WITH DRAIN PLACED DUE TO ABSCESS 2005 [facility]   BREAST BIOPSY Right 2017   had a reaction to xylo or xylo with epi where her throat closed up   BREAST BIOPSY Right 09/06/2022   [place] RT BREAST BX W LOC DEV 1ST LESION IMG BX SPEC US GUIDE 09/06/2022 [facility]   CHROMOPERTUBATION Right 08/12/2018   Procedure: CHROMOPERTUBATION;  Surgeon: Christeen Douglas, MD;  Location: [facility];  Service: Gynecology;  Laterality: Right;   FOOT SURGERY Bilateral 2008   toes going outward surgery to turn toes inward 2010    LAPAROSCOPIC OVARIAN CYSTECTOMY Right 08/12/2018   Procedure: LAPAROSCOPIC OVARIAN CYSTECTOMY;  Surgeon: Christeen Douglas, MD;  Location: [facility];  Service: Gynecology;  Laterality: Right;   LYSIS OF ADHESION Right 08/12/2018   Procedure: LYSIS OF ADHESIONS Lysis of Omental Adhesions, right cecum adhesions, right fallopian tube and right adnexal adhesions;  Surgeon: Christeen Douglas, MD;  Location: [facility];  Service: Gynecology;  Laterality: Right;   TONSILLECTOMY AND ADENOIDECTOMY N/A 04/17/2017   Procedure: TONSILLECTOMY AND POSSIBLE  ADENOIDECTOMY;  Surgeon: Linus Salmons, MD;  Location: [facility];  Service: ENT;  Laterality: N/A;   WISDOM TOOTH EXTRACTION     2016    OB History     Gravida  1   Para  1   Term  1   Preterm  0   AB  0   Living  1      SAB  0   IAB  0   Ectopic  0   Multiple  0   Live Births  1            Home Medications    Prior to Admission medications   Medication Sig Start Date End Date Taking? Authorizing Provider  albuterol (VENTOLIN HFA) 108 (90 Base) MCG/ACT inhaler Inhale 1-2 puffs into the lungs every 6 (six) hours as needed for wheezing or  shortness of breath. 03/06/23   Dana Allan, MD  Ascorbic Acid (VITAMIN C GUMMIE PO) Take by mouth daily.    [provider]  Azelastine HCl 137 MCG/SPRAY SOLN Place 2 sprays into both nostrils 2 (two) times daily.    [provider]  Cholecalciferol 1.25 MG (50000 UT) capsule Take 1 capsule (50,000 Units total) by mouth once a week. D3 1x per week x 6 months then take over the counter vitamin D3 2000 to 4000 IU daily starting month 7 11/06/21   McLean-Scocuzza, Pasty Spillers, MD  levocetirizine (XYZAL) 5 MG tablet Take 5 mg by mouth daily.    [provider]  mometasone (NASONEX) 50 MCG/ACT nasal spray Place 2 sprays into the nose daily. 11/13/22 11/13/23  [provider]  SPRINTEC 28 0.25-35 MG-MCG tablet SMARTSIG:1 Tablet(s) By Mouth Daily 10/05/21   [provider]    Family History Family History  Problem Relation Age of Onset   Hypertension Mother    Migraines Mother    Diabetes Father    Hyperlipidemia Father    Learning disabilities Sister    Cancer Maternal Grandmother        lung (small cell) >liver>pancreas smoker    Migraines Maternal Grandmother    Graves' disease Maternal Grandfather    Miscarriages / Stillbirths Maternal Grandfather    Cancer Maternal Grandfather        prostate/bladder    Ovarian cancer Paternal Grandmother    Cancer Paternal Grandmother        brain   Cancer Paternal Grandfather        skin cancer    Graves' disease Other    Breast cancer Neg Hx    Colon cancer Neg Hx    Heart disease Neg Hx     Social History Social History   Tobacco Use   Smoking status: Never   Smokeless tobacco: Never  Vaping Use   Vaping status: Never Used  Substance Use Topics   Alcohol use: Not Currently    Comment: Occasional 1-2 drinks every couple months   Drug use: No     Allergies   Lidocaine, Lidocaine-epinephrine (pf), Toradol [ketorolac tromethamine], Tramadol, Latex, Linzess [linaclotide], and Tape   Review of  Systems Review of Systems: negative unless otherwise stated in HPI.      Physical Exam Triage Vital Signs ED Triage Vitals  Encounter Vitals Group     BP 03/26/23 1304 110/62     Systolic BP Percentile --      Diastolic BP Percentile --      Pulse Rate 03/26/23 1304 (!) 111     Resp 03/26/23  1304 18     Temp 03/26/23 1304 99.7 F (37.6 C)     Temp Source 03/26/23 1304 Oral     SpO2 03/26/23 1304 99 %     Weight --      Height --      Head Circumference --      Peak Flow --      Pain Score 03/26/23 1302 8     Pain Loc --      Pain Education --      Exclude from Growth Chart --    No data found.  Updated Vital Signs BP 110/62 (BP Location: Left Arm)   Pulse (!) 111   Temp 99.7 F (37.6 C) (Oral)   Resp 18   LMP 02/24/2023 (Exact Date)   SpO2 99%   Visual Acuity Right Eye Distance:   Left Eye Distance:   Bilateral Distance:    Right Eye Near:   Left Eye Near:    Bilateral Near:     Physical Exam GEN:     alert, non-toxic appearing female in no distress ***   HENT:  mucus membranes moist, oropharyngeal ***without lesions or ***erythema, no*** tonsillar hypertrophy or exudates, *** moderate erythematous edematous turbinates, ***clear nasal discharge, ***bilateral TM normal EYES:   pupils equal and reactive, ***no scleral injection or discharge NECK:  normal ROM, no ***lymphadenopathy, ***no meningismus   RESP:  no increased work of breathing, ***clear to auscultation bilaterally CVS:   regular rate ***and rhythm Skin:   warm and dry, no rash on visible skin***    UC Treatments / Results  Labs (all labs ordered are listed, but only abnormal results are displayed) Labs Reviewed - No data to display  EKG   Radiology No results found.  Procedures Procedures (including critical care time)  Medications Ordered in UC Medications - No data to display  Initial Impression / Assessment and Plan / UC Course  I have reviewed the triage vital signs and the  nursing notes.  Pertinent labs & imaging results that were available during my care of the patient were reviewed by me and considered in my medical decision making (see chart for details).       Pt is a 32 y.o. female who presents for *** days of respiratory symptoms. [place] is ***afebrile here without recent antipyretics. Satting well on room air. Overall pt is ***non-toxic appearing, well hydrated, without respiratory distress. Pulmonary exam ***is unremarkable.  COVID testing obtained ***and was negative. ***Pt to quarantine until COVID test results or longer if positive.  I will call patient with test results, if positive. History consistent with ***viral respiratory illness. Discussed symptomatic treatment.  Explained lack of efficacy of antibiotics in viral disease.  Typical duration of symptoms discussed.   Return and ED precautions given and voiced understanding. Discussed MDM, treatment plan and plan for follow-up with patient*** who agrees with plan.     Final Clinical Impressions(s) / UC Diagnoses   Final diagnoses:  None   Discharge Instructions   None    ED Prescriptions   None    PDMP not reviewed this encounter.

## 2023-05-01 LAB — T4, FREE: Free T4: 1.11 ng/dL (ref 0.82–1.77)

## 2023-05-01 LAB — TSH: TSH: 1.54 u[IU]/mL (ref 0.450–4.500)

## 2023-05-01 LAB — T3, FREE: T3, Free: 2.8 pg/mL (ref 2.0–4.4)

## 2023-05-07 ENCOUNTER — Encounter: Payer: Self-pay | Admitting: Nurse Practitioner

## 2023-05-07 ENCOUNTER — Ambulatory Visit: Payer: BC Managed Care – PPO | Admitting: Nurse Practitioner

## 2023-05-07 VITALS — BP 106/60 | HR 72 | Ht 65.0 in | Wt 164.6 lb

## 2023-05-07 DIAGNOSIS — R768 Other specified abnormal immunological findings in serum: Secondary | ICD-10-CM

## 2023-05-07 DIAGNOSIS — E041 Nontoxic single thyroid nodule: Secondary | ICD-10-CM

## 2023-05-07 NOTE — Progress Notes (Signed)
05/07/2023     Endocrinology Follow Up Note     Subjective:    Patient ID: Ruth Tran, female    DOB: Apr 03, 1991, PCP Dana Allan, MD.   Past Medical History:  Diagnosis Date   Allergy    Chicken pox    Complication of anesthesia    WITH HER WISDOM TEETH SURGERY SHE HAD TO GO TO THE OR TO HAVE THIS DONE DUE TO TACHYCARDIA DUE TO GRAVES DISEASE   Complication of anesthesia    Dysmenorrhea    Family history of endometriosis    mother,sister   Graves disease    graves, hyperthyroidism    Headache    MIGRAINES   Migraine    Ovarian cyst    Panic attack    Third degree laceration of perineum during delivery, postpartum 05/13/2021   UTI (urinary tract infection)     Past Surgical History:  Procedure Laterality Date   APPENDECTOMY  2005   WITH DRAIN PLACED DUE TO ABSCESS 2005 [facility]   BREAST BIOPSY Right 2017   had a reaction to xylo or xylo with epi where her throat closed up   BREAST BIOPSY Right 09/06/2022   [place] RT BREAST BX W LOC DEV 1ST LESION IMG BX SPEC US GUIDE 09/06/2022 [facility]   CHROMOPERTUBATION Right 08/12/2018   Procedure: CHROMOPERTUBATION;  Surgeon: Christeen Douglas, MD;  Location: [facility];  Service: Gynecology;  Laterality: Right;   FOOT SURGERY Bilateral 2008   toes going outward surgery to turn toes inward 2010    LAPAROSCOPIC OVARIAN CYSTECTOMY Right 08/12/2018   Procedure: LAPAROSCOPIC OVARIAN CYSTECTOMY;  Surgeon: Christeen Douglas, MD;  Location: [facility];  Service: Gynecology;  Laterality: Right;   LYSIS OF ADHESION Right 08/12/2018   Procedure: LYSIS OF ADHESIONS Lysis of Omental Adhesions, right cecum adhesions, right fallopian tube and right adnexal adhesions;  Surgeon: Christeen Douglas, MD;  Location: [facility];  Service: Gynecology;  Laterality: Right;   TONSILLECTOMY AND ADENOIDECTOMY N/A 04/17/2017   Procedure: TONSILLECTOMY AND POSSIBLE  ADENOIDECTOMY;  Surgeon: Linus Salmons, MD;  Location: [facility];  Service: ENT;   Laterality: N/A;   WISDOM TOOTH EXTRACTION     2016    Social History   Socioeconomic History   Marital status: Married    Spouse name: Not on file   Number of children: 0   Years of education: Not on file   Highest education level: Bachelor's degree (e.g., BA, AB, BS)  Occupational History   Not on file  Tobacco Use   Smoking status: Never   Smokeless tobacco: Never  Vaping Use   Vaping status: Never Used  Substance and Sexual Activity   Alcohol use: Not Currently    Comment: Occasional 1-2 drinks every couple months   Drug use: No   Sexual activity: Not on file  Other Topics Concern   Not on file  Social History Narrative   BA Degree [employer]   Wears seat belt,    Guns at home    Safe in relationship    Does not exercise    Caffeine: cup of tea daily   Getting married 04/2020 new last name Becherer   75 month old son Eudelia Bunch as of 09/2021    Social Determinants of Health   Financial Resource Strain: Low Risk  (03/13/2023)   Overall Financial Resource Strain (CARDIA)    Difficulty of Paying Living Expenses: Not hard at all  Food Insecurity: No Food Insecurity (03/13/2023)   Hunger Vital  Sign    Worried About Programme researcher, broadcasting/film/video in the Last Year: Never true    Ran Out of Food in the Last Year: Never true  Transportation Needs: No Transportation Needs (03/13/2023)   PRAPARE - Administrator, Civil Service (Medical): No    Lack of Transportation (Non-Medical): No  Physical Activity: Unknown (03/13/2023)   Exercise Vital Sign    Days of Exercise per Week: Patient declined    Minutes of Exercise per Session: Not on file  Stress: No Stress Concern Present (03/13/2023)   [employer] of Occupational Health - Occupational Stress Questionnaire    Feeling of Stress : Not at all  Social Connections: Socially Integrated (03/13/2023)   Social Connection and Isolation Panel [NHANES]    Frequency of Communication with Friends and Family: More than three times a week     Frequency of Social Gatherings with Friends and Family: More than three times a week    Attends Religious Services: More than 4 times per year    Active Member of [employer] or Organizations: Yes    Attends Engineer, structural: More than 4 times per year    Marital Status: Married    Family History  Problem Relation Age of Onset   Hypertension Mother    Migraines Mother    Diabetes Father    Hyperlipidemia Father    Learning disabilities Sister    Cancer Maternal Grandmother        lung (small cell) >liver>pancreas smoker    Migraines Maternal Grandmother    Graves' disease Maternal Grandfather    Miscarriages / Stillbirths Maternal Grandfather    Cancer Maternal Grandfather        prostate/bladder    Ovarian cancer Paternal Grandmother    Cancer Paternal Grandmother        brain   Cancer Paternal Grandfather        skin cancer    Graves' disease Other    Breast cancer Neg Hx    Colon cancer Neg Hx    Heart disease Neg Hx     Outpatient Encounter Medications as of 05/07/2023  Medication Sig   albuterol (VENTOLIN HFA) 108 (90 Base) MCG/ACT inhaler Inhale 1-2 puffs into the lungs every 6 (six) hours as needed for wheezing or shortness of breath.   Ascorbic Acid (VITAMIN C GUMMIE PO) Take by mouth daily.   Azelastine HCl 137 MCG/SPRAY SOLN Place 2 sprays into both nostrils 2 (two) times daily.   Cholecalciferol 1.25 MG (50000 UT) capsule Take 1 capsule (50,000 Units total) by mouth once a week. D3 1x per week x 6 months then take over the counter vitamin D3 2000 to 4000 IU daily starting month 7   levocetirizine (XYZAL) 5 MG tablet Take 5 mg by mouth daily.   mometasone (NASONEX) 50 MCG/ACT nasal spray Place 2 sprays into the nose daily.   SPRINTEC 28 0.25-35 MG-MCG tablet SMARTSIG:1 Tablet(s) By Mouth Daily   No facility-administered encounter medications on file as of 05/07/2023.    ALLERGIES: Allergies  Allergen Reactions   Lidocaine Anaphylaxis    Lidocaine-Epinephrine (Pf) Anaphylaxis   Toradol [Ketorolac Tromethamine] Anaphylaxis and Rash    Throat swelling and feeling of heat over whole body.   Tramadol Anaphylaxis   Latex Rash   Linzess [Linaclotide]     Ab cramps    Tape Rash    VACCINATION STATUS: Immunization History  Administered Date(s) Administered   DTaP 06/01/1993, 07/28/1993, 09/29/1993, 07/03/1994, 04/06/2008  HIB (PRP-OMP) 06/01/1993, 07/28/1993, 09/29/1993, 07/03/1994   Hepatitis A 10/07/2007, 05/26/2008   Hepatitis A, Adult 10/07/2007, 05/26/2008   Hepatitis B July 15, 1992, 06/01/1993, 09/29/1993   Hepatitis B, ADULT 07/09/1992, 06/01/1993, 09/29/1993, 11/27/2017, 01/01/2018, 06/07/2018   IPV 06/01/1993, 07/28/1993, 09/29/1993, 04/06/1994   MMR 07/03/1994, 04/06/1998   PPD Test 04/21/2015   Td 10/07/2007   Tdap 10/07/2007, 01/21/2018, 02/18/2021   Varicella 10/27/1996, 10/07/2007     HPI  ZANIA WIGENT is 32 y.o. female who presents today with a medical history as above. she is being seen in follow up after being seen in consultation for abnormal thyroid labs requested by Dana Allan, MD.    She was previously seen by pediatric endocrinology and then [facility] for diagnosis of Graves disease starting at age 83.  She was subsequently given Methimazole and was stable on that medication until she was weaned off in approximately 2015.  Since then she has been in euthyroid state.  She had a baby about 7 months ago and then started noticing different symptoms including weight gain, fatigue, easy bruising, chronic constipation, hot/cold fluctuations, and brittle nails.     she denies dysphagia, choking, shortness of breath, no recent voice change.    she does have family history of thyroid dysfunction in her grandparents (grandpa had to have RAI for hyperthyroidism and grandma had hyperthyroidism in late adulthood) but denies family hx of thyroid cancer. she denies personal history of goiter. she is not  on any anti-thyroid medications nor on any thyroid hormone supplements. Denies use of Biotin containing supplements.     Review of systems  Constitutional: + minimally fluctuating body weight, current Body mass index is 27.39 kg/m., + fatigue- stable, hot/cold fluctuations-stable Eyes: no blurry vision, no xerophthalmia ENT: no sore throat, no nodules palpated in throat, no dysphagia/odynophagia, no hoarseness Cardiovascular: no chest pain, no shortness of breath, no palpitations, no leg swelling Respiratory: no cough, no shortness of breath Gastrointestinal: no nausea/vomiting/diarrhea, chronic constipation Musculoskeletal: no muscle/joint aches Skin: no rashes, no hyperemia, brittle nails, + intermittent hair loss, noticed she is bruising easily Neurological: no tremors, no numbness, no tingling, no dizziness Psychiatric: no depression, no anxiety   Objective:    BP 106/60 (BP Location: Right Arm, Patient Position: Sitting, Cuff Size: Large)   Pulse 72   Ht 5\' 5"  (1.651 m)   Wt 164 lb 9.6 oz (74.7 kg)   BMI 27.39 kg/m   Wt Readings from Last 3 Encounters:  05/07/23 164 lb 9.6 oz (74.7 kg)  03/20/23 160 lb 8 oz (72.8 kg)  03/06/23 158 lb 6 oz (71.8 kg)     BP Readings from Last 3 Encounters:  05/07/23 106/60  03/26/23 110/62  03/20/23 98/60     Physical Exam- Limited  Constitutional:  Body mass index is 27.39 kg/m. , not in acute distress, normal state of mind Eyes:  EOMI, no exophthalmos Musculoskeletal: no gross deformities, strength intact in all four extremities, no gross restriction of joint movements Skin:  no rashes, no hyperemia Neurological: no tremor with outstretched hands   CMP     Component Value Date/Time   NA 132 (L) 03/26/2023 1330   NA 142 12/23/2018 1105   K 4.1 03/26/2023 1330   CL 100 03/26/2023 1330   CO2 23 03/26/2023 1330   GLUCOSE 89 03/26/2023 1330   BUN 7 03/26/2023 1330   BUN 9 12/23/2018 1105   CREATININE 0.44 03/26/2023 1330    CALCIUM 8.6 (L) 03/26/2023 1330   PROT  7.4 03/05/2023 1455   PROT 6.2 12/23/2018 1105   ALBUMIN 3.9 03/05/2023 1455   ALBUMIN 4.2 12/23/2018 1105   AST 23 03/05/2023 1455   ALT 20 03/05/2023 1455   ALKPHOS 80 03/05/2023 1455   BILITOT 0.8 03/05/2023 1455   BILITOT 0.6 12/23/2018 1105   GFRNONAA >60 03/26/2023 1330   GFRAA >60 02/16/2019 2151     CBC    Component Value Date/Time   WBC 5.0 03/26/2023 1330   RBC 4.45 03/26/2023 1330   HGB 13.0 03/26/2023 1330   HGB 13.3 12/23/2018 1105   HCT 38.1 03/26/2023 1330   HCT 39.9 12/23/2018 1105   PLT 106 (L) 03/26/2023 1330   PLT 178 12/23/2018 1105   MCV 85.6 03/26/2023 1330   MCV 86 12/23/2018 1105   MCH 29.2 03/26/2023 1330   MCHC 34.1 03/26/2023 1330   RDW 13.3 03/26/2023 1330   RDW 11.7 12/23/2018 1105   LYMPHSABS 0.7 03/26/2023 1330   LYMPHSABS 1.7 12/23/2018 1105   MONOABS 0.5 03/26/2023 1330   EOSABS 0.0 03/26/2023 1330   EOSABS 0.0 12/23/2018 1105   BASOSABS 0.0 03/26/2023 1330   BASOSABS 0.0 12/23/2018 1105     Diabetic Labs (most recent): Lab Results  Component Value Date   HGBA1C 5.1 11/06/2022   HGBA1C 5.4 10/28/2021   HGBA1C 5.1 10/16/2017    Lipid Panel     Component Value Date/Time   CHOL 159 11/06/2022 1108   CHOL 190 12/23/2018 1105   TRIG 93.0 11/06/2022 1108   HDL 55.70 11/06/2022 1108   HDL 66 12/23/2018 1105   CHOLHDL 3 11/06/2022 1108   VLDL 18.6 11/06/2022 1108   LDLCALC 85 11/06/2022 1108   LDLCALC 100 (H) 12/23/2018 1105   LABVLDL 24 12/23/2018 1105     Lab Results  Component Value Date   TSH 1.540 04/30/2023   TSH 2.170 10/25/2022   TSH 2.110 06/26/2022   TSH 2.020 05/03/2022   TSH 2.200 03/20/2022   TSH 4.110 12/07/2021   TSH 11.99 (H) 10/28/2021   TSH 2.99 02/19/2020   TSH 0.192 (L) 12/23/2018   TSH 3.190 11/05/2017   FREET4 1.11 04/30/2023   FREET4 1.16 10/25/2022   FREET4 1.23 06/26/2022   FREET4 1.31 05/03/2022   FREET4 1.08 03/20/2022   FREET4 1.00 12/07/2021    FREET4 0.60 10/28/2021   FREET4 0.74 02/19/2020   FREET4 1.44 12/23/2018   FREET4 1.05 11/05/2017    Thyroid US from 12/15/21 CLINICAL DATA:  Abnormal thyroid lab values   EXAM: THYROID ULTRASOUND   TECHNIQUE: Ultrasound examination of the thyroid gland and adjacent soft tissues was performed.   COMPARISON:  None Available.   FINDINGS: Parenchymal Echotexture: Moderately heterogeneous   Isthmus: 0.7 cm   Right lobe: 6.0 x 1.4 x 1.9 cm   Left lobe: 4.9 x 1.3 x 1.6 cm   _________________________________________________________   Estimated total number of nodules >/= 1 cm: 1   Number of spongiform nodules >/=  2 cm not described below (TR1): 0   Number of mixed cystic and solid nodules >/= 1.5 cm not described below (TR2): 0   _________________________________________________________   Nodule # 1:   Location: Right; inferior   Maximum size: 1.8 cm; Other 2 dimensions: 1.0 x 0.5 cm   Composition: solid/almost completely solid (2)   Echogenicity: hypoechoic (2)   Shape: not taller-than-wide (0)   Margins: ill-defined (0)   Echogenic foci: none (0)   ACR TI-RADS total points: 4.   ACR TI-RADS  risk category: TR4 (4-6 points).   ACR TI-RADS recommendations:   **Given size (>/= 1.5 cm) and appearance, fine needle aspiration of this moderately suspicious nodule should be considered based on TI-RADS criteria.   IMPRESSION: Nodule 1 (TI-RADS 4), measuring 1.8 cm, located in the inferior right thyroid lobe, meets criteria for FNA.   The above is in keeping with the ACR TI-RADS recommendations - J Am Coll Radiol 2017;14:587-595.     Electronically Signed   By: Acquanetta Belling M.D.   On: 12/15/2021 11:55   Latest Reference Range & Units 12/07/21 14:48 03/20/22 11:10 05/03/22 11:28 06/26/22 13:26 10/25/22 11:13 04/30/23 15:09  TSH 0.450 - 4.500 uIU/mL 4.110 2.200 2.020 2.110 2.170 1.540  Triiodothyronine,Free,Serum 2.0 - 4.4 pg/mL 2.7 2.9 3.0 2.6 2.7 2.8   T4,Free(Direct) 0.82 - 1.77 ng/dL 0.63 0.16 0.10 9.32 3.55 1.11  Thyroperoxidase Ab SerPl-aCnc 0 - 34 IU/mL 585 (H)       Thyroglobulin Antibody 0.0 - 0.9 IU/mL <1.0       (H): Data is abnormally high   Assessment & Plan:   1. Abnormal TSH 2. Hx of Graves' disease 3. Thyroid nodule  she is being seen at a kind request of Dana Allan, MD.  She has history of Graves disease in remission, previously showing as underactive thyroid.  There are a few potential causes for this drop in thyroid function and additional testing is needed to help determining etiology which will guide treatment options.  Her repeat thyroid function tests show euthyroid presentation.  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  She is at a genetic predisposition of developing ongoing problems with her thyroid.  She is not at a point where anti-thyroid treatment or thyroid hormone replacement is needed.  Will monitor thyroid function tests again in 1 year.  Her [place] did show moderately suspicious thyroid nodule in right inferior gland which recommends FNA to rule out malignancy.  This was done and found to be benign.  She had ultrasound soft tissue neck on 03/26/23 at [facility] (in [facility] Every[facility]) where the thyroid was "unremarkable."  She was seen for significant jaw swelling and they were concerned about possible Sjrogens syndrome.  This is not something this clinic treats, thus if it happens again, I encouraged her to reach out to PCP for referral to Rheumatology.  She will not need additional imaging of her thyroid at this time.       -Patient is advised to maintain close follow up with Dana Allan, MD for primary care needs.      I spent  28  minutes in the care of the patient today including review of labs from Thyroid Function, CMP, and other relevant labs ; imaging/biopsy records (current and previous including abstractions from other facilities); face-to-face time  discussing  her lab results and symptoms, medications doses, her options of short and long term treatment based on the latest standards of care / guidelines;   and documenting the encounter.  Ruth Tran  participated in the discussions, expressed understanding, and voiced agreement with the above plans.  All questions were answered to her satisfaction. she is encouraged to contact clinic should she have any questions or concerns prior to her return visit.   Follow up plan: Return in about 1 year (around 05/06/2024) for Thyroid follow up, Previsit labs.   Thank you for involving me in the care of this pleasant patient, and I will continue to update you with her progress.  Ronny Bacon, [facility] [address] [place], [place] [address] Phone: [phone] Fax: [phone]  05/07/2023, 11:57 AM

## 2023-09-03 ENCOUNTER — Inpatient Hospital Stay (HOSPITAL_BASED_OUTPATIENT_CLINIC_OR_DEPARTMENT_OTHER): Payer: BC Managed Care – PPO | Admitting: Internal Medicine

## 2023-09-03 ENCOUNTER — Encounter: Payer: Self-pay | Admitting: Internal Medicine

## 2023-09-03 ENCOUNTER — Inpatient Hospital Stay: Payer: BC Managed Care – PPO | Attending: Internal Medicine

## 2023-09-03 VITALS — BP 114/70 | HR 72 | Temp 97.3°F | Resp 16 | Ht 65.0 in | Wt 168.6 lb

## 2023-09-03 DIAGNOSIS — D696 Thrombocytopenia, unspecified: Secondary | ICD-10-CM | POA: Insufficient documentation

## 2023-09-03 LAB — CMP (CANCER CENTER ONLY)
ALT: 14 U/L (ref 0–44)
AST: 21 U/L (ref 15–41)
Albumin: 3.7 g/dL (ref 3.5–5.0)
Alkaline Phosphatase: 42 U/L (ref 38–126)
Anion gap: 3 — ABNORMAL LOW (ref 5–15)
BUN: 13 mg/dL (ref 6–20)
CO2: 25 mmol/L (ref 22–32)
Calcium: 8.9 mg/dL (ref 8.9–10.3)
Chloride: 104 mmol/L (ref 98–111)
Creatinine: 0.67 mg/dL (ref 0.44–1.00)
GFR, Estimated: >60 mL/min (ref >60)
Glucose, Bld: 91 mg/dL (ref 70–99)
Potassium: 4.2 mmol/L (ref 3.5–5.1)
Sodium: 132 mmol/L — ABNORMAL LOW (ref 135–145)
Total Bilirubin: 1 mg/dL (ref 0.0–1.2)
Total Protein: 6.6 g/dL (ref 6.5–8.1)

## 2023-09-03 LAB — CBC WITH DIFFERENTIAL (CANCER CENTER ONLY)
Abs Immature Granulocytes: 0.01 10*3/uL (ref 0.00–0.07)
Basophils Absolute: 0 10*3/uL (ref 0.0–0.1)
Basophils Relative: 0 %
Eosinophils Absolute: 0 10*3/uL (ref 0.0–0.5)
Eosinophils Relative: 1 %
HCT: 40.2 % (ref 36.0–46.0)
Hemoglobin: 13.4 g/dL (ref 12.0–15.0)
Immature Granulocytes: 0 %
Lymphocytes Relative: 35 %
Lymphs Abs: 1.7 10*3/uL (ref 0.7–4.0)
MCH: 29.8 pg (ref 26.0–34.0)
MCHC: 33.3 g/dL (ref 30.0–36.0)
MCV: 89.5 fL (ref 80.0–100.0)
Monocytes Absolute: 0.4 10*3/uL (ref 0.1–1.0)
Monocytes Relative: 8 %
Neutro Abs: 2.8 10*3/uL (ref 1.7–7.7)
Neutrophils Relative %: 56 %
Platelet Count: 123 10*3/uL — ABNORMAL LOW (ref 150–400)
RBC: 4.49 MIL/uL (ref 3.87–5.11)
RDW: 13.5 % (ref 11.5–15.5)
WBC Count: 4.9 10*3/uL (ref 4.0–10.5)
nRBC: 0 % (ref 0.0–0.2)

## 2023-09-03 NOTE — Assessment & Plan Note (Signed)
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   #  ITP is usually treated with steroids.  Hold off any steroids at this time as patient is fairly asymptomatic as patient is asymptomatic and > 100.   #Since patient is clinically stable I think is reasonable for the patient to follow-up with PCP/can follow-up with [place] as needed.  Patient comfortable with the plan; to call us if any questions or concerns in the interim.  Patient will return to [place] if any significant concerns/or drop in the platelet count below 100.  For now she will follow-up with her PCP.   # DISPOSITION: # follow up in 6 months- as needed- Dr.B  Cc; Dr.Walsh   Addendum:

## 2023-09-03 NOTE — Progress Notes (Signed)
 Easy bruising: NO Petechiae (bleeding under skin): NO Gingival bleeding (gums): NO Epistaxis (nose bleeds): NO Heavy bleeding: NO Blood in the stool: NO Hematuria (blood in urine): NO

## 2023-09-03 NOTE — Progress Notes (Signed)
 [facility] CONSULT NOTE  Patient Care Team: Dana Allan, MD as PCP - General (Family Medicine) Earna Coder, MD as Consulting Physician (Oncology)  CHIEF COMPLAINTS/PURPOSE OF CONSULTATION: Thrombocytopenia  HISTORY OF PRESENTING ILLNESS: Patient ambulating-independently.  Alone.  Ruth Tran 33 y.o.  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  Patient denies any easy bruising or bleeding.  Denies any nosebleeds or gums.  Review of Systems  Constitutional:  Negative for chills, diaphoresis, fever, malaise/fatigue and weight loss.  HENT:  Negative for nosebleeds and sore throat.   Eyes:  Negative for double vision.  Respiratory:  Negative for cough, hemoptysis, sputum production, shortness of breath and wheezing.   Cardiovascular:  Negative for chest pain, palpitations, orthopnea and leg swelling.  Gastrointestinal:  Negative for abdominal pain, blood in stool, constipation, diarrhea, heartburn, melena, nausea and vomiting.  Genitourinary:  Negative for dysuria, frequency and urgency.  Musculoskeletal:  Negative for back pain and joint pain.  Skin: Negative.  Negative for itching and rash.  Neurological:  Negative for dizziness, tingling, focal weakness, weakness and headaches.  Endo/Heme/Allergies:  Does not bruise/bleed easily.  Psychiatric/Behavioral:  Negative for depression. The patient is not nervous/anxious and does not have insomnia.      MEDICAL HISTORY:  Past Medical History:  Diagnosis Date   Allergy    Chicken pox    Complication of anesthesia    WITH HER WISDOM TEETH SURGERY SHE HAD TO GO TO THE OR TO HAVE THIS DONE DUE TO TACHYCARDIA DUE TO GRAVES DISEASE   Complication of anesthesia    Dysmenorrhea    Family history of endometriosis    mother,sister   Graves disease    graves, hyperthyroidism    Headache    MIGRAINES   Migraine    Ovarian cyst    Panic attack    Third degree  laceration of perineum during delivery, postpartum 05/13/2021   UTI (urinary tract infection)     SURGICAL HISTORY: Past Surgical History:  Procedure Laterality Date   APPENDECTOMY  2005   WITH DRAIN PLACED DUE TO ABSCESS 2005 [facility]   BREAST BIOPSY Right 2017   had a reaction to xylo or xylo with epi where her throat closed up   BREAST BIOPSY Right 09/06/2022   [place] RT BREAST BX W LOC DEV 1ST LESION IMG BX SPEC US GUIDE 09/06/2022 [facility]   CHROMOPERTUBATION Right 08/12/2018   Procedure: CHROMOPERTUBATION;  Surgeon: Christeen Douglas, MD;  Location: [facility];  Service: Gynecology;  Laterality: Right;   FOOT SURGERY Bilateral 2008   toes going outward surgery to turn toes inward 2010    LAPAROSCOPIC OVARIAN CYSTECTOMY Right 08/12/2018   Procedure: LAPAROSCOPIC OVARIAN CYSTECTOMY;  Surgeon: Christeen Douglas, MD;  Location: [facility];  Service: Gynecology;  Laterality: Right;   LYSIS OF ADHESION Right 08/12/2018   Procedure: LYSIS OF ADHESIONS Lysis of Omental Adhesions, right cecum adhesions, right fallopian tube and right adnexal adhesions;  Surgeon: Christeen Douglas, MD;  Location: [facility];  Service: Gynecology;  Laterality: Right;   TONSILLECTOMY AND ADENOIDECTOMY N/A 04/17/2017   Procedure: TONSILLECTOMY AND POSSIBLE  ADENOIDECTOMY;  Surgeon: Linus Salmons, MD;  Location: [facility];  Service: ENT;  Laterality: N/A;   WISDOM TOOTH EXTRACTION     2016    SOCIAL HISTORY: Social History   Socioeconomic History   Marital status: Married    Spouse name: Not on file   Number of children: 0   Years of education: Not on  file   Highest education level: Bachelor's degree (e.g., BA, AB, BS)  Occupational History   Not on file  Tobacco Use   Smoking status: Never   Smokeless tobacco: Never  Vaping Use   Vaping status: Never Used  Substance and Sexual Activity   Alcohol use: Not Currently    Comment: Occasional 1-2 drinks every couple months   Drug use: No   Sexual activity: Not  on file  Other Topics Concern   Not on file  Social History Narrative   BA Degree [employer]   Wears seat belt,    Guns at home    Safe in relationship    Does not exercise    Caffeine: cup of tea daily   Getting married 04/2020 new last name [place]   39 month old son Eudelia Bunch as of 09/2021    Social Drivers of Health   Financial Resource Strain: Low Risk  (08/22/2023)   Received from [facility]   Overall Financial Resource Strain (CARDIA)    Difficulty of Paying Living Expenses: Not hard at all  Food Insecurity: No Food Insecurity (08/22/2023)   Received from [facility]   Hunger Vital Sign    Worried About Running Out of Food in the Last Year: Never true    Ran Out of Food in the Last Year: Never true  Transportation Needs: No Transportation Needs (08/22/2023)   Received from [facility] - Transportation    In the past 12 months, has lack of transportation kept you from medical appointments or from getting medications?: No    Lack of Transportation (Non-Medical): No  Physical Activity: Unknown (03/13/2023)   Exercise Vital Sign    Days of Exercise per Week: Patient declined    Minutes of Exercise per Session: Not on file  Stress: No Stress Concern Present (03/13/2023)   [employer] of Occupational Health - Occupational Stress Questionnaire    Feeling of Stress : Not at all  Social Connections: Socially Integrated (03/13/2023)   Social Connection and Isolation Panel [NHANES]    Frequency of Communication with Friends and Family: More than three times a week    Frequency of Social Gatherings with Friends and Family: More than three times a week    Attends Religious Services: More than 4 times per year    Active Member of [employer] or Organizations: Yes    Attends Engineer, structural: More than 4 times per year    Marital Status: Married  Catering manager Violence: Not At Risk (03/05/2023)   Humiliation, Afraid,  Rape, and Kick questionnaire    Fear of Current or Ex-Partner: No    Emotionally Abused: No    Physically Abused: No    Sexually Abused: No    FAMILY HISTORY: Family History  Problem Relation Age of Onset   Hypertension Mother    Migraines Mother    Diabetes Father    Hyperlipidemia Father    Learning disabilities Sister    Cancer Maternal Grandmother        lung (small cell) >liver>pancreas smoker    Migraines Maternal Grandmother    Graves' disease Maternal Grandfather    Miscarriages / Stillbirths Maternal Grandfather    Cancer Maternal Grandfather        prostate/bladder    Ovarian cancer Paternal Grandmother    Cancer Paternal Grandmother        brain   Cancer Paternal Grandfather  skin cancer    Graves' disease Other    Breast cancer Neg Hx    Colon cancer Neg Hx    Heart disease Neg Hx     ALLERGIES:  is allergic to lidocaine, lidocaine-epinephrine (pf), toradol [ketorolac tromethamine], tramadol, latex, linzess [linaclotide], and tape.  MEDICATIONS:  Current Outpatient Medications  Medication Sig Dispense Refill   Ascorbic Acid (VITAMIN C GUMMIE PO) Take by mouth daily.     Azelastine HCl 137 MCG/SPRAY SOLN Place 2 sprays into both nostrils 2 (two) times daily.     Cholecalciferol 1.25 MG (50000 UT) capsule Take 1 capsule (50,000 Units total) by mouth once a week. D3 1x per week x 6 months then take over the counter vitamin D3 2000 to 4000 IU daily starting month 7 13 capsule 1   levocetirizine (XYZAL) 5 MG tablet Take 5 mg by mouth daily.     mometasone (NASONEX) 50 MCG/ACT nasal spray Place 2 sprays into the nose daily.     SPRINTEC 28 0.25-35 MG-MCG tablet SMARTSIG:1 Tablet(s) By Mouth Daily     No current facility-administered medications for this visit.    PHYSICAL EXAMINATION:  Vitals:   09/03/23 1245  BP: 114/70  Pulse: 72  Resp: 16  Temp: (!) 97.3 F (36.3 C)  SpO2: 100%   Filed Weights   09/03/23 1245  Weight: 168 lb 9.6 oz (76.5 kg)     Physical Exam Vitals and nursing note reviewed.  HENT:     Head: Normocephalic and atraumatic.     Mouth/Throat:     Pharynx: Oropharynx is clear.  Eyes:     Extraocular Movements: Extraocular movements intact.     Pupils: Pupils are equal, round, and reactive to light.  Cardiovascular:     Rate and Rhythm: Normal rate and regular rhythm.  Pulmonary:     Comments: Decreased breath sounds bilaterally.  Abdominal:     Palpations: Abdomen is soft.  Musculoskeletal:        General: Normal range of motion.     Cervical back: Normal range of motion.  Skin:    General: Skin is warm.  Neurological:     General: No focal deficit present.     Mental Status: She is alert and oriented to person, place, and time.  Psychiatric:        Behavior: Behavior normal.        Judgment: Judgment normal.      LABORATORY DATA:  I have reviewed the data as listed Lab Results  Component Value Date   WBC 4.9 09/03/2023   HGB 13.4 09/03/2023   HCT 40.2 09/03/2023   MCV 89.5 09/03/2023   PLT 123 (L) 09/03/2023   Recent Labs    11/06/22 1108 03/05/23 1455 03/26/23 1330 09/03/23 1257  NA 141 137 132* 132*  K 4.4 4.6 4.1 4.2  CL 108 102 100 104  CO2 26 28 23 25   GLUCOSE 77 105* 89 91  BUN 13 17 7 13   CREATININE 0.65 0.73 0.44 0.67  CALCIUM 8.8 9.1 8.6* 8.9  GFRNONAA  --  >60 >60 >60  PROT 6.1 7.4  --  6.6  ALBUMIN 3.7 3.9  --  3.7  AST 19 23  --  21  ALT 10 20  --  14  ALKPHOS 40 80  --  42  BILITOT 0.7 0.8  --  1.0    RADIOGRAPHIC STUDIES: I have personally reviewed the radiological images as listed and agreed with the findings in  the report. No results found.  Thrombocytopenia (HCC) # Thrombocytopenia: Mild/moderate.  Clinically suggestive of ITP.   #  ITP is usually treated with steroids.  Hold off any steroids at this time as patient is fairly asymptomatic as patient is asymptomatic and > 100.   #Since patient is clinically stable I think is reasonable for the patient  to follow-up with PCP/can follow-up with [place] as needed.  Patient comfortable with the plan; to call us if any questions or concerns in the interim.  Patient will return to [place] if any significant concerns/or drop in the platelet count below 100.  For now she will follow-up with her PCP.   # DISPOSITION: # follow up in 6 months- as needed- Dr.B  Cc; Dr.Walsh   Addendum:    All questions were answered. The patient knows to call the clinic with any problems, questions or concerns.    Earna Coder, MD 09/03/2023 1:31 PM

## 2023-09-25 ENCOUNTER — Telehealth: Admitting: Physician Assistant

## 2023-09-25 DIAGNOSIS — B9689 Other specified bacterial agents as the cause of diseases classified elsewhere: Secondary | ICD-10-CM

## 2023-09-25 DIAGNOSIS — J019 Acute sinusitis, unspecified: Secondary | ICD-10-CM

## 2023-09-25 MED ORDER — AMOXICILLIN-POT CLAVULANATE 875-125 MG PO TABS: 1.0000 | ORAL_TABLET | Freq: Two times a day (BID) | ORAL | 0 refills | Status: DC

## 2023-09-25 NOTE — Progress Notes (Signed)

## 2023-09-25 NOTE — Progress Notes (Signed)
 I have spent 5 minutes in review of e-visit questionnaire, review and updating patient chart, medical decision making and response to patient.   Piedad Climes, PA-C

## 2023-11-12 ENCOUNTER — Ambulatory Visit (INDEPENDENT_AMBULATORY_CARE_PROVIDER_SITE_OTHER): Payer: BC Managed Care – PPO | Admitting: Family Medicine

## 2023-11-12 ENCOUNTER — Encounter: Payer: Self-pay | Admitting: Family Medicine

## 2023-11-12 VITALS — BP 110/66 | HR 67 | Temp 98.3°F | Resp 20 | Ht 65.0 in | Wt 168.0 lb

## 2023-11-12 DIAGNOSIS — E559 Vitamin D deficiency, unspecified: Secondary | ICD-10-CM

## 2023-11-12 DIAGNOSIS — Z8619 Personal history of other infectious and parasitic diseases: Secondary | ICD-10-CM

## 2023-11-12 DIAGNOSIS — Z13220 Encounter for screening for lipoid disorders: Secondary | ICD-10-CM | POA: Diagnosis not present

## 2023-11-12 DIAGNOSIS — D696 Thrombocytopenia, unspecified: Secondary | ICD-10-CM

## 2023-11-12 DIAGNOSIS — E0500 Thyrotoxicosis with diffuse goiter without thyrotoxic crisis or storm: Secondary | ICD-10-CM

## 2023-11-12 DIAGNOSIS — Z0000 Encounter for general adult medical examination without abnormal findings: Secondary | ICD-10-CM | POA: Diagnosis not present

## 2023-11-12 DIAGNOSIS — R600 Localized edema: Secondary | ICD-10-CM

## 2023-11-12 DIAGNOSIS — E871 Hypo-osmolality and hyponatremia: Secondary | ICD-10-CM

## 2023-11-12 NOTE — Progress Notes (Signed)
 SUBJECTIVE:   Chief Complaint  Patient presents with   Annual Exam   HPI Presents to clinic for annual visit  Discussed the use of AI scribe software for clinical note transcription with the patient, who gave verbal consent to proceed.  History of Present Illness Ruth Tran is a 33 year old female who presents for an annual physical exam.  She has a history of low sodium levels, with previous recordings at 132 mmol/L. No symptoms of dizziness, weakness, or confusion are currently present. She does not consume excessive fluids, which could contribute to hyponatremia.  She experiences migraines and manages them with Tylenol , taking two tablets as needed. Since the birth of her son three years ago, the frequency of migraines has decreased to about a dozen episodes. She does not take Tylenol  daily, with the last dose taken a week and a half ago.  She has a history of low platelet counts, monitored due to concerns about ITP. Her platelet levels have not dropped below 100,000, and she has not experienced any bleeding issues. She was previously on steroids for pneumonia, which temporarily increased her platelet count.  She had an abnormal Pap smear last year, which was HPV positive. She has no prior history of reproductive issues except for an ovarian cyst five years ago. She is scheduled for a cervical biopsy in July.  She experiences swelling in her jaw, identified as free fluid between the muscle and skin, not visible on dental x-rays. She chews gum as a stress reliever, which may contribute to jaw strain.  Her sleep is interrupted by her son's night terrors, causing her to wake up once or twice a night. She consumes one cup of coffee daily and occasionally drinks Coke Zero or unsweetened tea. She rarely consumes alcohol.    PERTINENT PMH / PSH: As above  OBJECTIVE:  BP 110/66   Pulse 67   Temp 98.3 F (36.8 C)   Resp 20   Ht 5' 5 (1.651 m)   Wt 168 lb (76.2 kg)   LMP  11/08/2023   SpO2 99%   BMI 27.96 kg/m    Physical Exam Vitals reviewed.  Constitutional:      General: She is not in acute distress.    Appearance: Normal appearance. She is normal weight. She is not ill-appearing, toxic-appearing or diaphoretic.  HENT:     Right Ear: Tympanic membrane, ear canal and external ear normal.     Left Ear: Tympanic membrane, ear canal and external ear normal.   Eyes:     General:        Right eye: No discharge.        Left eye: No discharge.     Conjunctiva/sclera: Conjunctivae normal.   Neck:     Thyroid : No thyromegaly or thyroid  tenderness.   Cardiovascular:     Rate and Rhythm: Normal rate and regular rhythm.     Heart sounds: Normal heart sounds.  Pulmonary:     Effort: Pulmonary effort is normal.     Breath sounds: Normal breath sounds.  Abdominal:     General: Bowel sounds are normal.   Musculoskeletal:        General: Normal range of motion.   Skin:    General: Skin is warm and dry.   Neurological:     General: No focal deficit present.     Mental Status: She is alert and oriented to person, place, and time. Mental status is at baseline.   Psychiatric:  Mood and Affect: Mood normal.        Behavior: Behavior normal.        Thought Content: Thought content normal.        Judgment: Judgment normal.           11/12/2023    1:39 PM 11/12/2023    1:36 PM 03/20/2023    1:38 PM 03/06/2023    7:45 AM 03/05/2023    2:01 PM  Depression screen PHQ 2/9  Decreased Interest 0 0 0 0 0  Down, Depressed, Hopeless 0 0 0 0 0  PHQ - 2 Score 0 0 0 0 0  Altered sleeping 1 1 0 0   Tired, decreased energy 1 1 0 0   Change in appetite 0 0 0 0   Feeling bad or failure about yourself  0 0 0 0   Trouble concentrating 0 0 0 0   Moving slowly or fidgety/restless 0 0 0 0   Suicidal thoughts 0 0 0 0   PHQ-9 Score 2 2 0 0   Difficult doing work/chores Not difficult at all Not difficult at all Not difficult at all Not difficult at all        11/12/2023    1:39 PM 11/12/2023    1:37 PM 03/20/2023    1:39 PM 03/06/2023    7:46 AM  GAD 7 : Generalized Anxiety Score  Nervous, Anxious, on Edge 0 0 0 0  Control/stop worrying 0 0 0 0  Worry too much - different things 0 0 0 0  Trouble relaxing 0 0 0 0  Restless 0 0 0 0  Easily annoyed or irritable 0 0 0 0  Afraid - awful might happen 0 0 0 0  Total GAD 7 Score 0 0 0 0  Anxiety Difficulty Not difficult at all Not difficult at all Not difficult at all Not difficult at all    ASSESSMENT/PLAN:  Annual physical exam Assessment & Plan: PAP up to date.  HPV positive.  Continues to follow with OBGYN Recommend HPV vaccine series Tetanus up to date. Due 09/32 HIV screening completed Hepatitis C screening completed Depression/GAD negative Recommend routine self breast exams Mammogram at age 28. Colonoscopy at age 18.   Hyponatremia Assessment & Plan: Mild hyponatremia likely related to previous pneumonia. Sodium stable at 132 mmol/L. No symptoms present. Possible causes include lab error or medication use. Plan to monitor sodium levels. - Recheck sodium levels with BMP. - Consider fluid restriction if sodium remains low. - Monitor for symptoms such as dizziness, weakness, or confusion.  Orders: -     Comprehensive metabolic panel with GFR  Lipid screening -     Lipid panel -     Lipid panel -     Specimen status report  Vitamin D  deficiency -     VITAMIN D  25 Hydroxy (Vit-D Deficiency, Fractures)  Thrombocytopenia (HCC) Assessment & Plan: Platelet count improved. No active bleeding. Hematologist suspects mild ITP. No official diagnosis of ITP given platelets have not decreased below 50,000. -Continue monitoring platelet count. -Follow up with oncologist in six months.   Graves disease Assessment & Plan: Stable.  Recent TSH normal.  Currently asymptomatic -Continue to follow up with Endocrinology   Salivary gland swelling Assessment & Plan: ENT follow-up  for jaw swelling due to possible repetitive motion from gum chewing causing inflammation. - Follow up with ENT for jaw swelling.   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  Has had previous positive results in 03/24 and 04/24 Following with OBGYN     PDMP reviewed  Return if symptoms worsen or fail to improve, for PCP.  Valli Gaw, MD

## 2023-11-12 NOTE — Patient Instructions (Addendum)
 It was a pleasure meeting you today. Thank you for allowing me to take part in your health care.  Our goals for today as we discussed include:  We will get some labs today.  If they are abnormal or we need to do something about them, I will call you.  If they are normal, I will send you a message on MyChart (if it is active) or a letter in the mail.  If you don't hear from us  in 2 weeks, please call the office at the number below.   Follow up with OBGYN as scheduled   This is a list of the screening recommended for you and due dates:  Health Maintenance  Topic Date Due   COVID-19 Vaccine (1) Never done   HPV Vaccine (1 - 3-dose series) Never done   Flu Shot  12/28/2023   Pap with HPV screening  06/24/2024   DTaP/Tdap/Td vaccine (10 - Td or Tdap) 02/19/2031   Hepatitis C Screening  Completed   HIV Screening  Completed   Meningitis B Vaccine  Aged Out     If you have any questions or concerns, please do not hesitate to call the office at [phone].  I look forward to our next visit and until then take care and stay safe.  Regards,   Valli Gaw, MD   [facility]

## 2023-11-13 LAB — COMPREHENSIVE METABOLIC PANEL WITH GFR
ALT: 12 IU/L (ref 0–32)
AST: 18 IU/L (ref 0–40)
Albumin: 4.4 g/dL (ref 4.0–5.0)
Alkaline Phosphatase: 46 IU/L (ref 44–121)
BUN/Creatinine Ratio: 25 — ABNORMAL HIGH (ref 9–23)
BUN: 15 mg/dL (ref 6–20)
Bilirubin Total: 0.5 mg/dL (ref 0.0–1.2)
CO2: 22 mmol/L (ref 20–29)
Calcium: 9.4 mg/dL (ref 8.7–10.2)
Chloride: 103 mmol/L (ref 96–106)
Creatinine, Ser: 0.6 mg/dL (ref 0.57–1.00)
Globulin, Total: 2 g/dL (ref 1.5–4.5)
Glucose: 75 mg/dL (ref 70–99)
Potassium: 4.6 mmol/L (ref 3.5–5.2)
Sodium: 140 mmol/L (ref 134–144)
Total Protein: 6.4 g/dL (ref 6.0–8.5)
eGFR: 124 mL/min/{1.73_m2} (ref >59)

## 2023-11-13 LAB — LIPID PANEL

## 2023-11-13 LAB — VITAMIN D 25 HYDROXY (VIT D DEFICIENCY, FRACTURES): Vit D, 25-Hydroxy: 55.9 ng/mL (ref 30.0–100.0)

## 2023-11-14 LAB — LIPID PANEL
Cholesterol, Total: 227 mg/dL — ABNORMAL HIGH (ref 100–199)
HDL: 70 mg/dL (ref >39)
LDL CALC COMMENT:: 3.2 ratio (ref 0.0–4.4)
LDL Chol Calc (NIH): 141 mg/dL — ABNORMAL HIGH (ref 0–99)
Triglycerides: 93 mg/dL (ref 0–149)
VLDL Cholesterol Cal: 16 mg/dL (ref 5–40)

## 2023-11-14 LAB — SPECIMEN STATUS REPORT

## 2023-11-15 ENCOUNTER — Ambulatory Visit: Payer: Self-pay | Admitting: Family Medicine

## 2023-11-15 ENCOUNTER — Encounter: Payer: Self-pay | Admitting: Family Medicine

## 2023-11-15 DIAGNOSIS — E871 Hypo-osmolality and hyponatremia: Secondary | ICD-10-CM | POA: Insufficient documentation

## 2023-11-15 DIAGNOSIS — Z13220 Encounter for screening for lipoid disorders: Secondary | ICD-10-CM | POA: Insufficient documentation

## 2023-11-15 DIAGNOSIS — Z8619 Personal history of other infectious and parasitic diseases: Secondary | ICD-10-CM | POA: Insufficient documentation

## 2023-11-15 NOTE — Assessment & Plan Note (Signed)
 Stable.  Recent TSH normal.  Currently asymptomatic -Continue to follow up with Endocrinology

## 2023-11-15 NOTE — Assessment & Plan Note (Signed)
 PAP up to date.  HPV positive.  Continues to follow with OBGYN Recommend HPV vaccine series Tetanus up to date. Due 09/32 HIV screening completed Hepatitis C screening completed Depression/GAD negative Recommend routine self breast exams Mammogram at age 33. Colonoscopy at age 41.

## 2023-11-15 NOTE — Assessment & Plan Note (Signed)
 Mild hyponatremia likely related to previous pneumonia. Sodium stable at 132 mmol/L. No symptoms present. Possible causes include lab error or medication use. Plan to monitor sodium levels. - Recheck sodium levels with BMP. - Consider fluid restriction if sodium remains low. - Monitor for symptoms such as dizziness, weakness, or confusion.

## 2023-11-15 NOTE — Assessment & Plan Note (Signed)
 ENT follow-up for jaw swelling due to possible repetitive motion from gum chewing causing inflammation. - Follow up with ENT for jaw swelling.

## 2023-11-15 NOTE — Assessment & Plan Note (Addendum)
 Platelet count improved. No active bleeding. Hematologist suspects mild ITP. No official diagnosis of ITP given platelets have not decreased below 50,000. -Continue monitoring platelet count. -Follow up with oncologist in six months.

## 2023-11-15 NOTE — Assessment & Plan Note (Signed)
 History of HPV on recent PAP.  Has had previous positive results in 03/24 and 04/24 Following with OBGYN

## 2024-01-04 ENCOUNTER — Encounter: Payer: Self-pay | Admitting: Nurse Practitioner

## 2024-01-04 DIAGNOSIS — Z8639 Personal history of other endocrine, nutritional and metabolic disease: Secondary | ICD-10-CM

## 2024-01-04 DIAGNOSIS — E041 Nontoxic single thyroid nodule: Secondary | ICD-10-CM

## 2024-01-04 DIAGNOSIS — R768 Other specified abnormal immunological findings in serum: Secondary | ICD-10-CM

## 2024-01-04 DIAGNOSIS — R7989 Other specified abnormal findings of blood chemistry: Secondary | ICD-10-CM

## 2024-01-08 ENCOUNTER — Ambulatory Visit: Payer: Self-pay | Admitting: Nurse Practitioner

## 2024-01-08 LAB — T4, FREE: Free T4: 1.21 ng/dL (ref 0.82–1.77)

## 2024-01-08 LAB — T3, FREE: T3, Free: 3.1 pg/mL (ref 2.0–4.4)

## 2024-01-08 LAB — TSH: TSH: 1.68 u[IU]/mL (ref 0.450–4.500)

## 2024-01-08 NOTE — Progress Notes (Signed)
 Noted that a message has been sent to the patient in MyChart.

## 2024-02-13 ENCOUNTER — Ambulatory Visit: Admitting: Nurse Practitioner

## 2024-02-13 ENCOUNTER — Encounter: Payer: Self-pay | Admitting: Nurse Practitioner

## 2024-02-13 VITALS — BP 106/68 | HR 56 | Temp 98.3°F | Ht 65.0 in | Wt 169.2 lb

## 2024-02-13 DIAGNOSIS — E871 Hypo-osmolality and hyponatremia: Secondary | ICD-10-CM | POA: Diagnosis not present

## 2024-02-13 DIAGNOSIS — Z8639 Personal history of other endocrine, nutritional and metabolic disease: Secondary | ICD-10-CM | POA: Diagnosis not present

## 2024-02-13 DIAGNOSIS — E663 Overweight: Secondary | ICD-10-CM | POA: Diagnosis not present

## 2024-02-13 DIAGNOSIS — E782 Mixed hyperlipidemia: Secondary | ICD-10-CM

## 2024-02-13 DIAGNOSIS — D696 Thrombocytopenia, unspecified: Secondary | ICD-10-CM

## 2024-02-13 LAB — CBC WITH DIFFERENTIAL/PLATELET
Basophils Absolute: 0 K/uL (ref 0.0–0.1)
Basophils Relative: 0.2 % (ref 0.0–3.0)
Eosinophils Absolute: 0 K/uL (ref 0.0–0.7)
Eosinophils Relative: 0.7 % (ref 0.0–5.0)
HCT: 41 % (ref 36.0–46.0)
Hemoglobin: 13.6 g/dL (ref 12.0–15.0)
Lymphocytes Relative: 39.1 % (ref 12.0–46.0)
Lymphs Abs: 1.6 K/uL (ref 0.7–4.0)
MCHC: 33.1 g/dL (ref 30.0–36.0)
MCV: 89.2 fl (ref 78.0–100.0)
Monocytes Absolute: 0.4 K/uL (ref 0.1–1.0)
Monocytes Relative: 9.1 % (ref 3.0–12.0)
Neutro Abs: 2.1 K/uL (ref 1.4–7.7)
Neutrophils Relative %: 50.9 % (ref 43.0–77.0)
Platelets: 130 K/uL — ABNORMAL LOW (ref 150.0–400.0)
RBC: 4.59 Mil/uL (ref 3.87–5.11)
RDW: 13.4 % (ref 11.5–15.5)
WBC: 4.1 K/uL (ref 4.0–10.5)

## 2024-02-13 LAB — BASIC METABOLIC PANEL WITH GFR
BUN: 11 mg/dL (ref 6–23)
CO2: 25 meq/L (ref 19–32)
Calcium: 9.6 mg/dL (ref 8.4–10.5)
Chloride: 104 meq/L (ref 96–112)
Creatinine, Ser: 0.55 mg/dL (ref 0.40–1.20)
GFR: 122.61 mL/min (ref >60.00)
Glucose, Bld: 74 mg/dL (ref 70–99)
Potassium: 4.5 meq/L (ref 3.5–5.1)
Sodium: 139 meq/L (ref 135–145)

## 2024-02-13 LAB — HEMOGLOBIN A1C: Hgb A1c MFr Bld: 5.4 % (ref 4.6–6.5)

## 2024-02-13 NOTE — Progress Notes (Signed)
 Leron Glance, NP-C Phone: [phone]  Ruth Tran is a 33 y.o. female who presents today for transfer of care.   Discussed the use of AI scribe software for clinical note transcription with the patient, who gave verbal consent to proceed.  History of Present Illness   Ruth Tran is a 33 year old female with a history of Graves' disease who presents for a transfer of care and routine follow-up.  She has a history of Graves' disease, diagnosed at age 53, and was on medication from age 65 until 74. Since discontinuing medication, her thyroid  labs have remained stable except for an elevation in June 2023, which has since normalized without medication. She continues to follow up with endocrinology and has had thyroid  ultrasounds and imaging. Recent labs in August 2025 were normal.  She has a history of low sodium levels, which were monitored last year. She had low sodium once, which occurred around the time she had pneumonia in September 2023. The sodium level was low only once and not significantly low.  Her A1c was last checked in June 2024 and was 5.1. She is concerned about diabetes due to a strong family history on her father's side, where her father and all his siblings have diabetes.  Her cholesterol was noted to be high in recent lab work, but she did not receive the message. There is no significant family history of heart disease, and her parents do not have cholesterol issues. She does not have other risk factors such as hypertension, diabetes, or smoking.      Social History   Tobacco Use  Smoking Status Never  Smokeless Tobacco Never    Current Outpatient Medications on File Prior to Visit  Medication Sig Dispense Refill   Ascorbic Acid (VITAMIN C GUMMIE PO) Take by mouth daily.     Azelastine HCl 137 MCG/SPRAY SOLN Place 2 sprays into both nostrils 2 (two) times daily.     Cholecalciferol  1.25 MG (50000 UT) capsule Take 1 capsule (50,000 Units total) by mouth once  a week. D3 1x per week x 6 months then take over the counter vitamin D3 2000 to 4000 IU daily starting month 7 13 capsule 1   levocetirizine (XYZAL) 5 MG tablet Take 5 mg by mouth daily.     mometasone (NASONEX) 50 MCG/ACT nasal spray Place 2 sprays into the nose daily.     SPRINTEC 28 0.25-35 MG-MCG tablet SMARTSIG:1 Tablet(s) By Mouth Daily     No current facility-administered medications on file prior to visit.     ROS see history of present illness  Objective  Physical Exam Vitals:   02/13/24 1336  BP: 106/68  Pulse: (!) 56  Temp: 98.3 F (36.8 C)  SpO2: 97%    BP Readings from Last 3 Encounters:  02/13/24 106/68  11/12/23 110/66  09/03/23 114/70   Wt Readings from Last 3 Encounters:  02/13/24 169 lb 3.2 oz (76.7 kg)  11/12/23 168 lb (76.2 kg)  09/03/23 168 lb 9.6 oz (76.5 kg)    Physical Exam Constitutional:      General: She is not in acute distress.    Appearance: Normal appearance.  HENT:     Head: Normocephalic.  Cardiovascular:     Rate and Rhythm: Normal rate and regular rhythm.     Heart sounds: Normal heart sounds.  Pulmonary:     Effort: Pulmonary effort is normal.     Breath sounds: Normal breath sounds.  Skin:    General:  Skin is warm and dry.  Neurological:     General: No focal deficit present.     Mental Status: She is alert.  Psychiatric:        Mood and Affect: Mood normal.        Behavior: Behavior normal.      Assessment/Plan: Please see individual problem list.  History of Graves' disease Assessment & Plan: Diagnosed at age 64 and treated until 2015, she is now euthyroid with normal labs and no symptoms. She remains under endocrinology care.   Moderate mixed hyperlipidemia not requiring statin therapy Assessment & Plan: Her total and LDL cholesterol were elevated in recent lab work. There is no family history of early heart disease or other cardiovascular risk factors. The LDL goal is under 100 to reduce risk. Encourage a  healthy diet and regular exercise. Monitor cholesterol levels periodically.    Hyponatremia Assessment & Plan: Check BMP today to monitor.   Orders: -     Basic metabolic panel with GFR  Thrombocytopenia Assessment & Plan: Managed by Hematology. 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 Check CBC today. Follow up with Hematology as scheduled.   Orders: -     CBC with Differential/Platelet  Overweight (BMI 25.0-29.9) Assessment & Plan: Check A1c. Significant family history. Encourage healthy diet and regular exercise.   Orders: -     Hemoglobin A1c     Return in about 9 months (around 11/12/2024) for Annual Exam, sooner as needed.   Leron Glance, NP-C [facility]

## 2024-02-14 ENCOUNTER — Ambulatory Visit: Payer: Self-pay | Admitting: Nurse Practitioner

## 2024-02-27 ENCOUNTER — Encounter: Payer: Self-pay | Admitting: Nurse Practitioner

## 2024-02-27 DIAGNOSIS — E782 Mixed hyperlipidemia: Secondary | ICD-10-CM | POA: Insufficient documentation

## 2024-02-27 NOTE — Assessment & Plan Note (Signed)
 Diagnosed at age 33 and treated until 2015, she is now euthyroid with normal labs and no symptoms. She remains under endocrinology care.

## 2024-02-27 NOTE — Assessment & Plan Note (Signed)
 Her total and LDL cholesterol were elevated in recent lab work. There is no family history of early heart disease or other cardiovascular risk factors. The LDL goal is under 100 to reduce risk. Encourage a healthy diet and regular exercise. Monitor cholesterol levels periodically.

## 2024-02-27 NOTE — Assessment & Plan Note (Signed)
 Check A1c. Significant family history. Encourage healthy diet and regular exercise.

## 2024-02-27 NOTE — Assessment & Plan Note (Signed)
 Check BMP today to monitor.

## 2024-02-27 NOTE — Assessment & Plan Note (Deleted)
 Diagnosed at age 33 and treated until 2015, she is now euthyroid with normal labs and no symptoms. She remains under endocrinology care.

## 2024-02-27 NOTE — Assessment & Plan Note (Signed)
 Managed by Hematology. 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 Check CBC today. Follow up with Hematology as scheduled.

## 2024-04-28 ENCOUNTER — Encounter: Payer: Self-pay | Admitting: Nurse Practitioner

## 2024-04-28 ENCOUNTER — Other Ambulatory Visit: Payer: Self-pay | Admitting: Nurse Practitioner

## 2024-04-28 DIAGNOSIS — R7689 Other specified abnormal immunological findings in serum: Secondary | ICD-10-CM

## 2024-04-28 DIAGNOSIS — R7989 Other specified abnormal findings of blood chemistry: Secondary | ICD-10-CM

## 2024-04-28 DIAGNOSIS — E041 Nontoxic single thyroid nodule: Secondary | ICD-10-CM

## 2024-05-01 LAB — T3, FREE: T3, Free: 2.8 pg/mL (ref 2.0–4.4)

## 2024-05-01 LAB — TSH: TSH: 1.7 u[IU]/mL (ref 0.450–4.500)

## 2024-05-01 LAB — T4, FREE: Free T4: 1.21 ng/dL (ref 0.82–1.77)

## 2024-05-06 ENCOUNTER — Ambulatory Visit: Payer: BC Managed Care – PPO | Admitting: Nurse Practitioner

## 2024-05-06 ENCOUNTER — Encounter: Payer: Self-pay | Admitting: Nurse Practitioner

## 2024-05-06 VITALS — BP 100/58 | HR 60 | Ht 65.0 in | Wt 171.4 lb

## 2024-05-06 DIAGNOSIS — E041 Nontoxic single thyroid nodule: Secondary | ICD-10-CM

## 2024-05-06 DIAGNOSIS — R7689 Other specified abnormal immunological findings in serum: Secondary | ICD-10-CM

## 2024-05-06 NOTE — Progress Notes (Signed)
 05/06/2024     Endocrinology Follow Up Note     Subjective:    Patient ID: Ruth Tran, female    DOB: 1990-09-12, PCP Gretel App, NP.   Past Medical History:  Diagnosis Date   Allergy    Anemia    During pregnancy   Chicken pox    Complication of anesthesia    WITH HER WISDOM TEETH SURGERY SHE HAD TO GO TO THE OR TO HAVE THIS DONE DUE TO TACHYCARDIA DUE TO GRAVES DISEASE   Complication of anesthesia    Dysmenorrhea    Family history of endometriosis    mother,sister   Graves disease    graves, hyperthyroidism    Headache    MIGRAINES   Migraine    Ovarian cyst    Panic attack    Third degree laceration of perineum during delivery, postpartum 05/13/2021   UTI (urinary tract infection)     Past Surgical History:  Procedure Laterality Date   APPENDECTOMY  2005   WITH DRAIN PLACED DUE TO ABSCESS 2005 [facility]   BREAST BIOPSY Right 2017   had a reaction to xylo or xylo with epi where her throat closed up   BREAST BIOPSY Right 09/06/2022   US  RT BREAST BX W LOC DEV 1ST LESION IMG BX SPEC US  GUIDE 09/06/2022 [facility]   CHROMOPERTUBATION Right 08/12/2018   Procedure: CHROMOPERTUBATION;  Surgeon: Verdon Keen, MD;  Location: [facility];  Service: Gynecology;  Laterality: Right;   FOOT SURGERY Bilateral 2008   toes going outward surgery to turn toes inward 2010    LAPAROSCOPIC OVARIAN CYSTECTOMY Right 08/12/2018   Procedure: LAPAROSCOPIC OVARIAN CYSTECTOMY;  Surgeon: Verdon Keen, MD;  Location: [facility];  Service: Gynecology;  Laterality: Right;   LYSIS OF ADHESION Right 08/12/2018   Procedure: LYSIS OF ADHESIONS Lysis of Omental Adhesions, right cecum adhesions, right fallopian tube and right adnexal adhesions;  Surgeon: Verdon Keen, MD;  Location: [facility];  Service: Gynecology;  Laterality: Right;   TONSILLECTOMY AND ADENOIDECTOMY N/A 04/17/2017   Procedure: TONSILLECTOMY AND POSSIBLE  ADENOIDECTOMY;  Surgeon: Herminio Miu, MD;  Location:  [facility];  Service: ENT;  Laterality: N/A;   WISDOM TOOTH EXTRACTION     2016    Social History   Socioeconomic History   Marital status: Married    Spouse name: Not on file   Number of children: 0   Years of education: Not on file   Highest education level: Bachelor's degree (e.g., BA, AB, BS)  Occupational History   Not on file  Tobacco Use   Smoking status: Never   Smokeless tobacco: Never  Vaping Use   Vaping status: Never Used  Substance and Sexual Activity   Alcohol use: Not Currently    Comment: Occasional 1-2 drinks every couple months   Drug use: No   Sexual activity: Yes    Birth control/protection: Pill, Other-see comments  Other Topics Concern   Not on file  Social History Narrative   BA Degree [employer]   Wears seat belt,    Guns at home    Safe in relationship    Does not exercise    Caffeine: cup of tea daily   Getting married 04/2020 new last name Boehm   70 month old son Ladd as of 09/2021    Social Drivers of Health   Financial Resource Strain: Low Risk  (02/09/2024)   Overall Financial Resource Strain (CARDIA)    Difficulty of Paying Living Expenses: Not  hard at all  Food Insecurity: No Food Insecurity (02/09/2024)   Hunger Vital Sign    Worried About Running Out of Food in the Last Year: Never true    Ran Out of Food in the Last Year: Never true  Transportation Needs: No Transportation Needs (02/09/2024)   PRAPARE - Administrator, Civil Service (Medical): No    Lack of Transportation (Non-Medical): No  Physical Activity: Unknown (02/09/2024)   Exercise Vital Sign    Days of Exercise per Week: Patient declined    Minutes of Exercise per Session: Not on file  Stress: No Stress Concern Present (02/09/2024)   [employer] of Occupational Health - Occupational Stress Questionnaire    Feeling of Stress: Only a little  Social Connections: Socially Integrated (02/09/2024)   Social Connection and Isolation Panel    Frequency of Communication  with Friends and Family: More than three times a week    Frequency of Social Gatherings with Friends and Family: More than three times a week    Attends Religious Services: More than 4 times per year    Active Member of [employer] or Organizations: Yes    Attends Engineer, Structural: More than 4 times per year    Marital Status: Married    Family History  Problem Relation Age of Onset   Hypertension Mother    Migraines Mother    Diabetes Father    Hyperlipidemia Father    Learning disabilities Sister    Cancer Maternal Grandmother        lung (small cell) >liver>pancreas smoker    Migraines Maternal Grandmother    Miscarriages / Stillbirths Maternal Grandmother    Graves' disease Maternal Grandfather    Miscarriages / Stillbirths Maternal Grandfather    Cancer Maternal Grandfather        prostate/bladder    Ovarian cancer Paternal Grandmother    Cancer Paternal Grandmother        brain   Cancer Paternal Grandfather        skin cancer    Graves' disease Other    Breast cancer Neg Hx    Colon cancer Neg Hx    Heart disease Neg Hx     Outpatient Encounter Medications as of 05/06/2024  Medication Sig   Ascorbic Acid (VITAMIN C GUMMIE PO) Take by mouth daily.   Azelastine HCl 137 MCG/SPRAY SOLN Place 2 sprays into both nostrils 2 (two) times daily.   Cholecalciferol  1.25 MG (50000 UT) capsule Take 1 capsule (50,000 Units total) by mouth once a week. D3 1x per week x 6 months then take over the counter vitamin D3 2000 to 4000 IU daily starting month 7   levocetirizine (XYZAL) 5 MG tablet Take 5 mg by mouth daily.   mometasone (NASONEX) 50 MCG/ACT nasal spray Place 2 sprays into the nose daily.   SPRINTEC 28 0.25-35 MG-MCG tablet SMARTSIG:1 Tablet(s) By Mouth Daily   No facility-administered encounter medications on file as of 05/06/2024.    ALLERGIES: Allergies  Allergen Reactions   Lidocaine  Anaphylaxis   Lidocaine -Epinephrine  (Pf) Anaphylaxis   Toradol  [Ketorolac   Tromethamine ] Anaphylaxis and Rash    Throat swelling and feeling of heat over whole body.   Tramadol Anaphylaxis   Latex Rash   Linzess [Linaclotide]     Ab cramps    Tape Rash    VACCINATION STATUS: Immunization History  Administered Date(s) Administered   DTP 06/01/1993, 07/28/1993, 09/29/1993, 07/03/1994   DTaP 06/01/1993, 07/28/1993, 09/29/1993, 07/03/1994, 04/06/2008  HIB (PRP-OMP) 06/01/1993, 07/28/1993, 09/29/1993, 07/03/1994   HIB, Unspecified 06/01/1993, 07/28/1993, 09/29/1993, 07/03/1994   Hepatitis A 10/07/2007, 05/26/2008   Hepatitis A, Adult 10/07/2007, 05/26/2008   Hepatitis A, Ped/Adol-2 Dose 10/07/2007, 05/26/2008   Hepatitis B 14-Jul-1992, 06/01/1993, 09/29/1993   Hepatitis B, ADULT 1993[birth year]04[birth year]03, 06/01/1993, 09/29/1993, 11/27/2017, 01/01/2018, 06/07/2018   IPV 06/01/1993, 07/28/1993, 09/29/1993, 04/06/1994, 04/06/1998   MMR 07/03/1994, 04/06/1998   OPV 06/01/1993, 07/28/1993, 09/29/1993   PPD Test 04/21/2015   Td 10/07/2007   Tdap 10/07/2007, 01/21/2018, 02/18/2021   Varicella 10/27/1996, 10/07/2007     HPI  MARDEL GRUDZIEN is 33 y.o. female who presents today with a medical history as above. she is being seen in follow up after being seen in consultation for abnormal thyroid  labs requested by Gretel App, NP.    She was previously seen by pediatric endocrinology and then [facility] for diagnosis of Graves disease starting at age 66.  She was subsequently given Methimazole and was stable on that medication until she was weaned off in approximately 2015.  Since then she has been in euthyroid state.  She had a baby about 7 months ago and then started noticing different symptoms including weight gain, fatigue, easy bruising, chronic constipation, hot/cold fluctuations, and brittle nails.     she denies dysphagia, choking, shortness of breath, no recent voice change.    she does have family history of thyroid  dysfunction in her grandparents (grandpa had to  have RAI for hyperthyroidism and grandma had hyperthyroidism in late adulthood) but denies family hx of thyroid  cancer. she denies personal history of goiter. she is not on any anti-thyroid  medications nor on any thyroid  hormone supplements. Denies use of Biotin containing supplements.     Review of systems  Constitutional: + Minimally fluctuating body weight,  current Body mass index is 28.52 kg/m. , no fatigue, no subjective hyperthermia, no subjective hypothermia Eyes: no blurry vision, no xerophthalmia ENT: no sore throat, no nodules palpated in throat, no dysphagia/odynophagia, no hoarseness Cardiovascular: no chest pain, no shortness of breath, no palpitations, no leg swelling Respiratory: no cough, no shortness of breath Gastrointestinal: no nausea/vomiting/diarrhea Musculoskeletal: no muscle/joint aches Skin: no rashes, no hyperemia Neurological: no tremors, no numbness, no tingling, no dizziness Psychiatric: no depression, no anxiety   Objective:    BP (!) 100/58 (BP Location: Left Arm, Patient Position: Sitting, Cuff Size: Large)   Pulse 60   Ht 5' 5 (1.651 m)   Wt 171 lb 6.4 oz (77.7 kg)   BMI 28.52 kg/m   Wt Readings from Last 3 Encounters:  05/06/24 171 lb 6.4 oz (77.7 kg)  02/13/24 169 lb 3.2 oz (76.7 kg)  11/12/23 168 lb (76.2 kg)     BP Readings from Last 3 Encounters:  05/06/24 (!) 100/58  02/13/24 106/68  11/12/23 110/66     Physical Exam- Limited  Constitutional:  Body mass index is 28.52 kg/m. , not in acute distress, normal state of mind Eyes:  EOMI, no exophthalmos Thyroid : mild enlargement, no palpable nodularity Musculoskeletal: no gross deformities, strength intact in all four extremities, no gross restriction of joint movements Skin:  no rashes, no hyperemia Neurological: no tremor with outstretched hands   CMP     Component Value Date/Time   NA 139 02/13/2024 1355   NA 140 11/12/2023 1429   K 4.5 02/13/2024 1355   CL 104 02/13/2024  1355   CO2 25 02/13/2024 1355   GLUCOSE 74 02/13/2024 1355   BUN 11 02/13/2024 1355  BUN 15 11/12/2023 1429   CREATININE 0.55 02/13/2024 1355   CREATININE 0.67 09/03/2023 1257   CALCIUM  9.6 02/13/2024 1355   PROT 6.4 11/12/2023 1429   ALBUMIN 4.4 11/12/2023 1429   AST 18 11/12/2023 1429   AST 21 09/03/2023 1257   ALT 12 11/12/2023 1429   ALT 14 09/03/2023 1257   ALKPHOS 46 11/12/2023 1429   BILITOT 0.5 11/12/2023 1429   BILITOT 1.0 09/03/2023 1257   GFRNONAA >60 09/03/2023 1257   GFRAA >60 02/16/2019 2151     CBC    Component Value Date/Time   WBC 4.1 02/13/2024 1355   RBC 4.59 02/13/2024 1355   HGB 13.6 02/13/2024 1355   HGB 13.4 09/03/2023 1257   HGB 13.3 12/23/2018 1105   HCT 41.0 02/13/2024 1355   HCT 39.9 12/23/2018 1105   PLT 130.0 (L) 02/13/2024 1355   PLT 123 (L) 09/03/2023 1257   PLT 178 12/23/2018 1105   MCV 89.2 02/13/2024 1355   MCV 86 12/23/2018 1105   MCH 29.8 09/03/2023 1257   MCHC 33.1 02/13/2024 1355   RDW 13.4 02/13/2024 1355   RDW 11.7 12/23/2018 1105   LYMPHSABS 1.6 02/13/2024 1355   LYMPHSABS 1.7 12/23/2018 1105   MONOABS 0.4 02/13/2024 1355   EOSABS 0.0 02/13/2024 1355   EOSABS 0.0 12/23/2018 1105   BASOSABS 0.0 02/13/2024 1355   BASOSABS 0.0 12/23/2018 1105     Diabetic Labs (most recent): Lab Results  Component Value Date   HGBA1C 5.4 02/13/2024   HGBA1C 5.1 11/06/2022   HGBA1C 5.4 10/28/2021    Lipid Panel     Component Value Date/Time   CHOL 227 (H) 11/12/2023 1429   TRIG 93 11/12/2023 1429   HDL 70 11/12/2023 1429   CHOLHDL 3.2 11/12/2023 1429   CHOLHDL 3 11/06/2022 1108   VLDL 18.6 11/06/2022 1108   LDLCALC 141 (H) 11/12/2023 1429   LABVLDL 16 11/12/2023 1429     Lab Results  Component Value Date   TSH 1.700 04/30/2024   TSH 1.680 01/07/2024   TSH 1.540 04/30/2023   TSH 2.170 10/25/2022   TSH 2.110 06/26/2022   TSH 2.020 05/03/2022   TSH 2.200 03/20/2022   TSH 4.110 12/07/2021   TSH 11.99 (H) 10/28/2021    TSH 2.99 02/19/2020   FREET4 1.21 04/30/2024   FREET4 1.21 01/07/2024   FREET4 1.11 04/30/2023   FREET4 1.16 10/25/2022   FREET4 1.23 06/26/2022   FREET4 1.31 05/03/2022   FREET4 1.08 03/20/2022   FREET4 1.00 12/07/2021   FREET4 0.60 10/28/2021   FREET4 0.74 02/19/2020    Thyroid  US  from 12/15/21 CLINICAL DATA:  Abnormal thyroid  lab values   EXAM: THYROID  ULTRASOUND   TECHNIQUE: Ultrasound examination of the thyroid  gland and adjacent soft tissues was performed.   COMPARISON:  None Available.   FINDINGS: Parenchymal Echotexture: Moderately heterogeneous   Isthmus: 0.7 cm   Right lobe: 6.0 x 1.4 x 1.9 cm   Left lobe: 4.9 x 1.3 x 1.6 cm   _________________________________________________________   Estimated total number of nodules >/= 1 cm: 1   Number of spongiform nodules >/=  2 cm not described below (TR1): 0   Number of mixed cystic and solid nodules >/= 1.5 cm not described below (TR2): 0   _________________________________________________________   Nodule # 1:   Location: Right; inferior   Maximum size: 1.8 cm; Other 2 dimensions: 1.0 x 0.5 cm   Composition: solid/almost completely solid (2)   Echogenicity: hypoechoic (2)  Shape: not taller-than-wide (0)   Margins: ill-defined (0)   Echogenic foci: none (0)   ACR TI-RADS total points: 4.   ACR TI-RADS risk category: TR4 (4-6 points).   ACR TI-RADS recommendations:   **Given size (>/= 1.5 cm) and appearance, fine needle aspiration of this moderately suspicious nodule should be considered based on TI-RADS criteria.   IMPRESSION: Nodule 1 (TI-RADS 4), measuring 1.8 cm, located in the inferior right thyroid  lobe, meets criteria for FNA.   The above is in keeping with the ACR TI-RADS recommendations - J Am Coll Radiol 2017;14:587-595.     Electronically Signed   By: Aliene Lloyd M.D.   On: 12/15/2021 11:55   Latest Reference Range & Units 10/25/22 11:13 04/30/23 15:09 01/07/24 16:32  04/30/24 16:05  TSH 0.450 - 4.500 uIU/mL 2.170 1.540 1.680 1.700  Triiodothyronine,Free,Serum 2.0 - 4.4 pg/mL 2.7 2.8 3.1 2.8  T4,Free(Direct) 0.82 - 1.77 ng/dL 8.83 8.88 8.78 8.78     Assessment & Plan:   1. Abnormal TSH 2. Hx of Graves' disease 3. Thyroid  nodule  she is being seen at a kind request of Gretel App, NP.  She has history of Graves disease in remission, previously showing as underactive thyroid .  There are a few potential causes for this drop in thyroid  function and additional testing is needed to help determining etiology which will guide treatment options.  Her repeat thyroid  function tests show euthyroid presentation.  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  She is at a genetic predisposition of developing ongoing problems with her thyroid .  She is not at a point where anti-thyroid  treatment or thyroid  hormone replacement is needed.  Will monitor thyroid  function tests again in 1 year.  Her US  did show moderately suspicious thyroid  nodule in right inferior gland which recommends FNA to rule out malignancy.  This was done and found to be benign.  She had ultrasound soft tissue neck on 03/26/23 at [facility] (in Care Everywhere) where the thyroid  was unremarkable.  She will not need additional imaging of her thyroid  at this time.       -Patient is advised to maintain close follow up with Gretel App, NP for primary care needs.      I spent  15  minutes in the care of the patient today including review of labs from Thyroid  Function, CMP, and other relevant labs ; imaging/biopsy records (current and previous including abstractions from other facilities); face-to-face time discussing  her lab results and symptoms, medications doses, her options of short and long term treatment based on the latest standards of care / guidelines;   and documenting the encounter.  Toniqua L Coccia  participated in the discussions, expressed  understanding, and voiced agreement with the above plans.  All questions were answered to her satisfaction. she is encouraged to contact clinic should she have any questions or concerns prior to her return visit.   Follow up plan: Return in about 1 year (around 05/06/2025) for Thyroid  follow up, Previsit labs.   Thank you for involving me in the care of this pleasant patient, and I will continue to update you with her progress.   Benton Rio, [facility] [address] [place], [place] [address] Phone: [phone] Fax: [phone]  05/06/2024, 10:58 AM

## 2024-05-19 ENCOUNTER — Telehealth: Admitting: Physician Assistant

## 2024-05-19 DIAGNOSIS — J069 Acute upper respiratory infection, unspecified: Secondary | ICD-10-CM | POA: Diagnosis not present

## 2024-05-19 DIAGNOSIS — B9689 Other specified bacterial agents as the cause of diseases classified elsewhere: Secondary | ICD-10-CM

## 2024-05-19 MED ORDER — AMOXICILLIN-POT CLAVULANATE 875-125 MG PO TABS: 1.0000 | ORAL_TABLET | Freq: Two times a day (BID) | ORAL | 0 refills | Status: AC

## 2024-05-19 NOTE — Progress Notes (Signed)
"      E-Visit for Sinus Problems  We are sorry that you are not feeling well.  Here is how we plan to help!  Based on what you have shared with me it looks like you have sinusitis and pharyngitis.  Sinusitis is inflammation and infection in the sinus cavities of the head.  Based on your presentation I believe you most likely have Acute Bacterial Sinusitis.  This is an infection caused by bacteria and is treated with antibiotics. I have prescribed Augmentin  875mg /125mg  one tablet twice daily with food, for 7 days. You may use an oral decongestant such as Mucinex  D or if you have glaucoma or high blood pressure use plain Mucinex . Saline nasal spray help and can safely be used as often as needed for congestion.  If you develop worsening sinus pain, fever or notice severe headache and vision changes, or if symptoms are not better after completion of antibiotic, please schedule an appointment with a health care provider.    Sinus infections are not as easily transmitted as other respiratory infection, however we still recommend that you avoid close contact with loved ones, especially the very young and elderly.  Remember to wash your hands thoroughly throughout the day as this is the number one way to prevent the spread of infection!  Home Care: Only take medications as instructed by your medical team. Complete the entire course of an antibiotic. Do not take these medications with alcohol. A steam or ultrasonic humidifier can help congestion.  You can place a towel over your head and breathe in the steam from hot water coming from a faucet. Avoid close contacts especially the very young and the elderly. Cover your mouth when you cough or sneeze. Always remember to wash your hands.  Get Help Right Away If: You develop worsening fever or sinus pain. You develop a severe head ache or visual changes. Your symptoms persist after you have completed your treatment plan.  Make sure you Understand these  instructions. Will watch your condition. Will get help right away if you are not doing well or get worse.  Your e-visit answers were reviewed by a board certified advanced clinical practitioner to complete your personal care plan.  Depending on the condition, your plan could have included both over the counter or prescription medications.  If there is a problem please reply  once you have received a response from your provider.  Your safety is important to us .  If you have drug allergies check your prescription carefully.    You can use MyChart to ask questions about todays visit, request a non-urgent call back, or ask for a work or school excuse for 24 hours related to this e-Visit. If it has been greater than 24 hours you will need to follow up with your provider, or enter a new e-Visit to address those concerns.  You will get an e-mail in the next two days asking about your experience.  I hope that your e-visit has been valuable and will speed your recovery. Thank you for using e-visits.  I have spent 5 minutes in review of e-visit questionnaire, review and updating patient chart, medical decision making and response to patient.   Elsie Velma Lunger, PA-C     "

## 2024-11-11 ENCOUNTER — Encounter: Admitting: Nurse Practitioner

## 2025-05-06 ENCOUNTER — Ambulatory Visit: Admitting: Nurse Practitioner
